# Patient Record
Sex: Male | Born: 1945 | Race: White | Hispanic: No | Smoking: Former smoker
Health system: Southern US, Community
[De-identification: ages and names within clinical notes are randomized; demographics above are authoritative.]

## PROBLEM LIST (undated history)

## (undated) DIAGNOSIS — R1310 Dysphagia, unspecified: Secondary | ICD-10-CM

## (undated) DIAGNOSIS — J449 Chronic obstructive pulmonary disease, unspecified: Secondary | ICD-10-CM

## (undated) DIAGNOSIS — E785 Hyperlipidemia, unspecified: Secondary | ICD-10-CM

## (undated) DIAGNOSIS — I779 Disorder of arteries and arterioles, unspecified: Secondary | ICD-10-CM

## (undated) DIAGNOSIS — I10 Essential (primary) hypertension: Secondary | ICD-10-CM

## (undated) DIAGNOSIS — Z923 Personal history of irradiation: Secondary | ICD-10-CM

## (undated) DIAGNOSIS — C099 Malignant neoplasm of tonsil, unspecified: Secondary | ICD-10-CM

## (undated) DIAGNOSIS — F410 Panic disorder [episodic paroxysmal anxiety] without agoraphobia: Secondary | ICD-10-CM

## (undated) DIAGNOSIS — I2510 Atherosclerotic heart disease of native coronary artery without angina pectoris: Secondary | ICD-10-CM

## (undated) DIAGNOSIS — K219 Gastro-esophageal reflux disease without esophagitis: Secondary | ICD-10-CM

## (undated) DIAGNOSIS — M1990 Unspecified osteoarthritis, unspecified site: Secondary | ICD-10-CM

## (undated) DIAGNOSIS — I739 Peripheral vascular disease, unspecified: Secondary | ICD-10-CM

## (undated) DIAGNOSIS — Z789 Other specified health status: Secondary | ICD-10-CM

## (undated) DIAGNOSIS — Z87442 Personal history of urinary calculi: Secondary | ICD-10-CM

## (undated) DIAGNOSIS — Z8249 Family history of ischemic heart disease and other diseases of the circulatory system: Secondary | ICD-10-CM

## (undated) DIAGNOSIS — N200 Calculus of kidney: Secondary | ICD-10-CM

## (undated) HISTORY — DX: Dysphagia, unspecified: R13.10

## (undated) HISTORY — DX: Calculus of kidney: N20.0

## (undated) HISTORY — DX: Family history of ischemic heart disease and other diseases of the circulatory system: Z82.49

## (undated) HISTORY — DX: Other specified health status: Z78.9

## (undated) HISTORY — PX: EYE SURGERY: SHX253

## (undated) HISTORY — DX: Essential (primary) hypertension: I10

## (undated) HISTORY — PX: MULTIPLE TOOTH EXTRACTIONS: SHX2053

## (undated) HISTORY — DX: Panic disorder (episodic paroxysmal anxiety): F41.0

## (undated) HISTORY — PX: BICEPS TENDON REPAIR: SHX566

## (undated) HISTORY — DX: Atherosclerotic heart disease of native coronary artery without angina pectoris: I25.10

## (undated) HISTORY — DX: Hyperlipidemia, unspecified: E78.5

---

## 2000-08-25 ENCOUNTER — Encounter: Admission: RE | Admit: 2000-08-25 | Discharge: 2000-08-25 | Payer: Self-pay | Admitting: Family Medicine

## 2000-08-25 ENCOUNTER — Encounter: Payer: Self-pay | Admitting: Family Medicine

## 2000-08-30 ENCOUNTER — Ambulatory Visit (HOSPITAL_COMMUNITY): Admission: RE | Admit: 2000-08-30 | Discharge: 2000-08-30 | Payer: Self-pay | Admitting: Family Medicine

## 2001-12-02 ENCOUNTER — Encounter: Payer: Self-pay | Admitting: Urology

## 2001-12-02 ENCOUNTER — Ambulatory Visit (HOSPITAL_BASED_OUTPATIENT_CLINIC_OR_DEPARTMENT_OTHER): Admission: RE | Admit: 2001-12-02 | Discharge: 2001-12-02 | Payer: Self-pay | Admitting: Urology

## 2001-12-02 ENCOUNTER — Ambulatory Visit (HOSPITAL_COMMUNITY): Admission: RE | Admit: 2001-12-02 | Discharge: 2001-12-02 | Payer: Self-pay | Admitting: Urology

## 2003-07-06 ENCOUNTER — Ambulatory Visit (HOSPITAL_COMMUNITY): Admission: RE | Admit: 2003-07-06 | Discharge: 2003-07-07 | Payer: Self-pay | Admitting: Orthopaedic Surgery

## 2003-07-18 ENCOUNTER — Emergency Department (HOSPITAL_COMMUNITY): Admission: EM | Admit: 2003-07-18 | Discharge: 2003-07-18 | Payer: Self-pay | Admitting: Emergency Medicine

## 2003-11-09 ENCOUNTER — Ambulatory Visit (HOSPITAL_COMMUNITY): Admission: RE | Admit: 2003-11-09 | Discharge: 2003-11-09 | Payer: Self-pay | Admitting: Orthopaedic Surgery

## 2004-12-07 ENCOUNTER — Encounter: Admission: RE | Admit: 2004-12-07 | Discharge: 2004-12-07 | Payer: Self-pay | Admitting: Family Medicine

## 2005-01-16 ENCOUNTER — Encounter: Admission: RE | Admit: 2005-01-16 | Discharge: 2005-02-13 | Payer: Self-pay | Admitting: Neurological Surgery

## 2005-01-20 ENCOUNTER — Encounter: Admission: RE | Admit: 2005-01-20 | Discharge: 2005-01-20 | Payer: Self-pay | Admitting: Neurological Surgery

## 2006-05-14 ENCOUNTER — Ambulatory Visit: Payer: Self-pay | Admitting: Family Medicine

## 2006-05-18 DIAGNOSIS — I2510 Atherosclerotic heart disease of native coronary artery without angina pectoris: Secondary | ICD-10-CM

## 2006-05-18 HISTORY — DX: Atherosclerotic heart disease of native coronary artery without angina pectoris: I25.10

## 2007-02-25 ENCOUNTER — Ambulatory Visit: Payer: Self-pay | Admitting: Family Medicine

## 2007-03-24 ENCOUNTER — Ambulatory Visit: Payer: Self-pay | Admitting: Family Medicine

## 2007-07-05 ENCOUNTER — Ambulatory Visit: Payer: Self-pay | Admitting: Family Medicine

## 2007-07-07 ENCOUNTER — Ambulatory Visit: Payer: Self-pay | Admitting: Family Medicine

## 2007-12-30 ENCOUNTER — Ambulatory Visit: Payer: Self-pay | Admitting: Family Medicine

## 2008-02-21 ENCOUNTER — Ambulatory Visit: Payer: Self-pay | Admitting: Family Medicine

## 2008-03-01 ENCOUNTER — Encounter: Admission: RE | Admit: 2008-03-01 | Discharge: 2008-03-01 | Payer: Self-pay | Admitting: Family Medicine

## 2008-03-01 ENCOUNTER — Ambulatory Visit: Payer: Self-pay | Admitting: Family Medicine

## 2008-04-02 ENCOUNTER — Ambulatory Visit: Payer: Self-pay | Admitting: Family Medicine

## 2008-05-18 HISTORY — PX: COLONOSCOPY: SHX174

## 2008-06-18 ENCOUNTER — Ambulatory Visit: Payer: Self-pay | Admitting: Family Medicine

## 2008-06-18 ENCOUNTER — Observation Stay (HOSPITAL_COMMUNITY): Admission: EM | Admit: 2008-06-18 | Discharge: 2008-06-19 | Payer: Self-pay | Admitting: Emergency Medicine

## 2008-06-19 HISTORY — PX: CARDIAC CATHETERIZATION: SHX172

## 2008-06-20 ENCOUNTER — Ambulatory Visit: Payer: Self-pay | Admitting: Family Medicine

## 2008-07-26 ENCOUNTER — Ambulatory Visit: Payer: Self-pay | Admitting: Family Medicine

## 2008-08-20 ENCOUNTER — Ambulatory Visit: Payer: Self-pay | Admitting: Family Medicine

## 2008-11-27 ENCOUNTER — Ambulatory Visit: Payer: Self-pay | Admitting: Family Medicine

## 2008-11-29 ENCOUNTER — Encounter: Admission: RE | Admit: 2008-11-29 | Discharge: 2008-11-29 | Payer: Self-pay | Admitting: Family Medicine

## 2008-11-29 ENCOUNTER — Encounter (INDEPENDENT_AMBULATORY_CARE_PROVIDER_SITE_OTHER): Payer: Self-pay | Admitting: *Deleted

## 2008-12-07 ENCOUNTER — Ambulatory Visit: Payer: Self-pay | Admitting: Gastroenterology

## 2008-12-17 ENCOUNTER — Ambulatory Visit: Payer: Self-pay | Admitting: Gastroenterology

## 2009-03-21 ENCOUNTER — Telehealth: Payer: Self-pay | Admitting: Gastroenterology

## 2009-03-21 ENCOUNTER — Ambulatory Visit (HOSPITAL_COMMUNITY): Admission: RE | Admit: 2009-03-21 | Discharge: 2009-03-21 | Payer: Self-pay | Admitting: Family Medicine

## 2009-03-21 ENCOUNTER — Encounter (INDEPENDENT_AMBULATORY_CARE_PROVIDER_SITE_OTHER): Payer: Self-pay | Admitting: *Deleted

## 2009-03-29 ENCOUNTER — Encounter: Payer: Self-pay | Admitting: Gastroenterology

## 2009-03-29 ENCOUNTER — Ambulatory Visit: Payer: Self-pay | Admitting: Gastroenterology

## 2009-03-29 DIAGNOSIS — K219 Gastro-esophageal reflux disease without esophagitis: Secondary | ICD-10-CM | POA: Insufficient documentation

## 2009-03-29 LAB — CONVERTED CEMR LAB
Albumin: 3.9 g/dL (ref 3.5–5.2)
Basophils Relative: 0.7 % (ref 0.0–3.0)
Chloride: 102 meq/L (ref 96–112)
Eosinophils Absolute: 0 10*3/uL (ref 0.0–0.7)
Glucose, Bld: 104 mg/dL — ABNORMAL HIGH (ref 70–99)
Lymphocytes Relative: 18.8 % (ref 12.0–46.0)
Lymphs Abs: 1.4 10*3/uL (ref 0.7–4.0)
MCHC: 35.3 g/dL (ref 30.0–36.0)
MCV: 96.1 fL (ref 78.0–100.0)
Monocytes Absolute: 0.6 10*3/uL (ref 0.1–1.0)
Monocytes Relative: 7.4 % (ref 3.0–12.0)
Neutro Abs: 5.4 10*3/uL (ref 1.4–7.7)
RBC: 4.61 M/uL (ref 4.22–5.81)
RDW: 11.6 % (ref 11.5–14.6)
Sodium: 141 meq/L (ref 135–145)
Tissue Transglutaminase Ab, IgA: 1 units (ref <7)
Total Protein: 7.8 g/dL (ref 6.0–8.3)
WBC: 7.5 10*3/uL (ref 4.5–10.5)

## 2009-04-03 ENCOUNTER — Ambulatory Visit: Payer: Self-pay | Admitting: Gastroenterology

## 2009-04-08 ENCOUNTER — Ambulatory Visit: Payer: Self-pay | Admitting: Family Medicine

## 2009-04-09 ENCOUNTER — Telehealth (INDEPENDENT_AMBULATORY_CARE_PROVIDER_SITE_OTHER): Payer: Self-pay | Admitting: *Deleted

## 2009-04-10 ENCOUNTER — Encounter: Admission: RE | Admit: 2009-04-10 | Discharge: 2009-05-08 | Payer: Self-pay | Admitting: Family Medicine

## 2009-04-22 ENCOUNTER — Ambulatory Visit (HOSPITAL_COMMUNITY): Admission: RE | Admit: 2009-04-22 | Discharge: 2009-04-22 | Payer: Self-pay | Admitting: Gastroenterology

## 2009-05-28 ENCOUNTER — Ambulatory Visit: Payer: Self-pay | Admitting: Gastroenterology

## 2009-06-17 ENCOUNTER — Ambulatory Visit: Payer: Self-pay | Admitting: Family Medicine

## 2009-07-01 ENCOUNTER — Ambulatory Visit: Payer: Self-pay | Admitting: Family Medicine

## 2009-08-05 ENCOUNTER — Ambulatory Visit: Payer: Self-pay | Admitting: Family Medicine

## 2009-08-09 ENCOUNTER — Encounter: Admission: RE | Admit: 2009-08-09 | Discharge: 2009-08-09 | Payer: Self-pay | Admitting: Family Medicine

## 2009-10-11 ENCOUNTER — Encounter: Admission: RE | Admit: 2009-10-11 | Discharge: 2009-10-11 | Payer: Self-pay | Admitting: Neurological Surgery

## 2010-02-10 ENCOUNTER — Ambulatory Visit: Payer: Self-pay | Admitting: Family Medicine

## 2010-05-30 ENCOUNTER — Ambulatory Visit
Admission: RE | Admit: 2010-05-30 | Discharge: 2010-05-30 | Payer: Self-pay | Source: Home / Self Care | Attending: Family Medicine | Admitting: Family Medicine

## 2010-06-17 NOTE — Assessment & Plan Note (Signed)
  Review of gastrointestinal problems: 1. Functional dyspepsia.  Belching, bloating, chest discomfort around stressful situations, 2010.  November, 2010 EGD found mild gastritis, biopsies negative for H. pylori. Proton pump inhibitor trial did not help his symptoms. Gastric emptying scan 2010 was normal. Abdominal ultrasound and HIDA scan, per patient report were also normal in 2010. Complete metabolic profile, celiac sprue testing 2010 were normal.    History of Present Illness Visit Type: Follow-up Visit Primary GI MD: Rob Bunting MD Primary Provider: Sharlot Gowda MD Chief Complaint: 4-5 week f.u History of Present Illness:     who has been feeling "pretty good."  He stopped a BP med amlodipine, felt improved soon after stopping.  Ran out of nexium (didn't make a difference anyway).  He has noticed that stressful situation will cause the symptoms (belching, chest discomfort, belching relieved the symptoms immediciately).  He believes he is greiving over his wife who passed november 2009.            Allergies: 1)  ! Sulfa  Vital Signs:  Patient profile:   65 year old male Height:      68 inches Weight:      141.25 pounds BMI:     21.55 Pulse rate:   70 / minute Pulse rhythm:   regular BP sitting:   132 / 70  (left arm)  Vitals Entered By: Chales Abrahams CMA Duncan Dull) (May 28, 2009 10:10 AM)  Physical Exam  Additional Exam:  Constitutional: generally well appearing Psychiatric: alert and oriented times 3 Abdomen: soft, non-tender, non-distended, normal bowel sounds    Impression & Recommendations:  Problem # 1:  functional dyspepsia he has come to the conclusion independently that stressful situations are probably accounting for his dyspeptic symptoms. Most of his stressors revolve around his wife's passing, November 2009 which she is obviously still grieving about. He is able to breathe deeply, sometimes takes a Xanax, relaxes, and this has definitely helped his  symptoms.  He will return to see me on an as-needed basis. The workup, summarized above, quite extensive and I don't think any further testing needs to be done unless things have a significant change.  Patient Instructions: 1)  Return to see Dr. Christella Hartigan as needed. 2)  A copy of this information will be sent to Dr. Susann Givens. 3)  The medication list was reviewed and reconciled.  All changed / newly prescribed medications were explained.  A complete medication list was provided to the patient / caregiver.

## 2010-07-30 ENCOUNTER — Ambulatory Visit (INDEPENDENT_AMBULATORY_CARE_PROVIDER_SITE_OTHER): Payer: BC Managed Care – PPO | Admitting: Family Medicine

## 2010-07-30 DIAGNOSIS — Z79899 Other long term (current) drug therapy: Secondary | ICD-10-CM

## 2010-09-02 LAB — COMPREHENSIVE METABOLIC PANEL
ALT: 20 U/L (ref 0–53)
AST: 27 U/L (ref 0–37)
Albumin: 4 g/dL (ref 3.5–5.2)
Alkaline Phosphatase: 62 U/L (ref 39–117)
Alkaline Phosphatase: 68 U/L (ref 39–117)
BUN: 7 mg/dL (ref 6–23)
BUN: 8 mg/dL (ref 6–23)
CO2: 25 mEq/L (ref 19–32)
CO2: 25 mEq/L (ref 19–32)
Calcium: 9.6 mg/dL (ref 8.4–10.5)
Chloride: 106 mEq/L (ref 96–112)
GFR calc Af Amer: >60 mL/min (ref >60)
GFR calc non Af Amer: >60 mL/min (ref >60)
Glucose, Bld: 96 mg/dL (ref 70–99)
Potassium: 3.5 mEq/L (ref 3.5–5.1)
Potassium: 3.5 mEq/L (ref 3.5–5.1)
Sodium: 141 mEq/L (ref 135–145)
Total Bilirubin: 0.9 mg/dL (ref 0.3–1.2)
Total Bilirubin: 1.1 mg/dL (ref 0.3–1.2)
Total Protein: 6.8 g/dL (ref 6.0–8.3)
Total Protein: 7.5 g/dL (ref 6.0–8.3)

## 2010-09-02 LAB — URINALYSIS, ROUTINE W REFLEX MICROSCOPIC
Bilirubin Urine: NEGATIVE
Nitrite: NEGATIVE
Specific Gravity, Urine: 1.01 (ref 1.005–1.030)
Urobilinogen, UA: 0.2 mg/dL (ref 0.0–1.0)
pH: 8 (ref 5.0–8.0)

## 2010-09-02 LAB — CARDIAC PANEL(CRET KIN+CKTOT+MB+TROPI)
CK, MB: 0.9 ng/mL (ref 0.3–4.0)
Relative Index: 0.8 (ref 0.0–2.5)
Total CK: 119 U/L (ref 7–232)
Total CK: 135 U/L (ref 7–232)
Troponin I: <0.01 ng/mL (ref 0.00–0.06)

## 2010-09-02 LAB — CBC
HCT: 40.7 % (ref 39.0–52.0)
HCT: 41.5 % (ref 39.0–52.0)
HCT: 45 % (ref 39.0–52.0)
Hemoglobin: 14 g/dL (ref 13.0–17.0)
Hemoglobin: 14.3 g/dL (ref 13.0–17.0)
Hemoglobin: 15.5 g/dL (ref 13.0–17.0)
MCHC: 34.4 g/dL (ref 30.0–36.0)
MCHC: 34.4 g/dL (ref 30.0–36.0)
MCV: 93.8 fL (ref 78.0–100.0)
Platelets: 281 10*3/uL (ref 150–400)
RBC: 4.4 MIL/uL (ref 4.22–5.81)
RBC: 4.8 MIL/uL (ref 4.22–5.81)
RDW: 12.5 % (ref 11.5–15.5)
RDW: 12.5 % (ref 11.5–15.5)
RDW: 12.7 % (ref 11.5–15.5)
WBC: 11 10*3/uL — ABNORMAL HIGH (ref 4.0–10.5)

## 2010-09-02 LAB — DIFFERENTIAL
Basophils Absolute: 0 10*3/uL (ref 0.0–0.1)
Basophils Relative: 0 % (ref 0–1)
Eosinophils Absolute: 0 10*3/uL (ref 0.0–0.7)
Eosinophils Absolute: 0 10*3/uL (ref 0.0–0.7)
Lymphs Abs: 1.4 10*3/uL (ref 0.7–4.0)
Monocytes Absolute: 0.5 10*3/uL (ref 0.1–1.0)
Monocytes Relative: 5 % (ref 3–12)
Monocytes Relative: 6 % (ref 3–12)
Neutro Abs: 6.6 10*3/uL (ref 1.7–7.7)
Neutrophils Relative %: 77 % (ref 43–77)
Neutrophils Relative %: 81 % — ABNORMAL HIGH (ref 43–77)

## 2010-09-02 LAB — RAPID URINE DRUG SCREEN, HOSP PERFORMED
Barbiturates: NOT DETECTED
Cocaine: NOT DETECTED
Opiates: NOT DETECTED

## 2010-09-02 LAB — APTT: aPTT: 29 seconds (ref 24–37)

## 2010-09-02 LAB — BASIC METABOLIC PANEL
BUN: 7 mg/dL (ref 6–23)
CO2: 28 mEq/L (ref 19–32)
Glucose, Bld: 102 mg/dL — ABNORMAL HIGH (ref 70–99)
Potassium: 4.6 mEq/L (ref 3.5–5.1)
Sodium: 140 mEq/L (ref 135–145)

## 2010-09-02 LAB — LIPID PANEL: VLDL: UNDETERMINED mg/dL (ref 0–40)

## 2010-09-02 LAB — MAGNESIUM: Magnesium: 1.9 mg/dL (ref 1.5–2.5)

## 2010-09-02 LAB — BRAIN NATRIURETIC PEPTIDE: Pro B Natriuretic peptide (BNP): <30 pg/mL (ref 0.0–100.0)

## 2010-09-02 LAB — POCT CARDIAC MARKERS
CKMB, poc: 1.2 ng/mL (ref 1.0–8.0)
Troponin i, poc: <0.05 ng/mL (ref 0.00–0.09)

## 2010-09-02 LAB — AMYLASE: Amylase: 87 U/L (ref 27–131)

## 2010-09-02 LAB — PROTIME-INR: Prothrombin Time: 12.9 seconds (ref 11.6–15.2)

## 2010-09-30 NOTE — Discharge Summary (Signed)
NAME:  Matthew Moreno, POKORSKI NO.:  [id number]   MEDICAL RECORD NO.:  [id number]          PATIENT TYPE:  INP   LOCATION:  4740                         FACILITY:  [facility]   PHYSICIAN:  Thereasa Solo. Little, M.D. DATE OF BIRTH:  23-Aug-1945   DATE OF ADMISSION:  06/18/2008  DATE OF DISCHARGE:  06/19/2008                               DISCHARGE SUMMARY   DISCHARGE DIAGNOSES:  1. Chest pain, noncoronary ischemia related.  2. Coronary artery disease status post cardiac catheterization on      June 19, 2008, by Dr. Julieanne Manson with disease in his left      anterior descending 40-50% mid, right coronary artery was tortuous      and 30% mid, right first obtuse marginal artery 1 was 50% ostial.  3. Ejection fraction of greater than 60%.  4. No renal artery stenosis and no abdominal aneurysm noted on      catheterization.  5. Hypertension, new, now placed on lisinopril and metoprolol.  6. Dyslipidemia with low HDL, high non-HDL, and high triglycerides,      now on simvastatin 40 to be followed as an outpatient.  7. Anxiety.  8. Significant premature family history of coronary artery disease      with a mother who died at age 41 with an myocardial infarction.   LABORATORY DATA:  AST was 27, ALT was 24.  Sodium 139, potassium 3.5,  glucose was 104, BUN 8, creatinine 1.01, magnesium was 1.9.  Urine drug  screen was negative.  BNP was less than 30.  TSH was 0.888.  Hemoglobin  14.3, hematocrit 41.5, WBCs 8.5, and platelets 281.  Urinalysis was  negative.  Total cholesterol was 215, triglycerides 408, HDL was 29, and  LDL was nondetectable.  His non-HDL is 186.  Chest x-ray showed no acute  findings.   DISCHARGE MEDICATIONS:  1. Lisinopril 10 mg a day.  2. Metoprolol 25 mg twice per day.  3. Simvastatin 40 mg a day.  4. Aspirin 81 mg 3 per day.  5. Xanax 0.25 mg as needed p.r.n.   HOSPITAL COURSE:  [name] is a 65 year old white widowed male  patient who was out  shoveling snow on June 18, 2008.  He had a sudden  onset of shortness of breath and chest heaviness, he tried to ignore it,  he drove to the post office, he became diaphoretic there, he went home,  took some Xanax and rested for 30 minutes, he had a short nap, but when  he awoke, his discomfort was still present, so he went to see Dr.  Susann Givens.  He was given aspirin and a nitroglycerin without much relief.  O2 was started.  His EKG was without any acute changes and he was sent  to the emergency room.  Dr. Susann Givens referred him to our service.  In the  ER, he was seen by Dr. Nicki Guadalajara who did not think this was acute.  He wanted to watch him overnight.  His CK-MBs and troponins were  negative.  He was seen by Dr. Julieanne Manson the following morning.  He  had had similar episodes of chest pain at home in November 2009.  Dr.  Clarene Duke decided with his premature family history and his lipid profile  that he should undergo cardiac catheterization.  This was performed and  he had some disease, but nonobstructive.  His LAD had mid 40-50%, his  circumflex had 3 OM, his OM1 had ostial 50%, his RCA was tortuous with  30% mid-narrowing, his left main was patent.  He did not have any renal  artery stenosis and no abdominal aorta was noted.  It was decided he  could be discharged to home on hypertensive medications and lipid  medications.  He will follow up with  Dr. Sheliah Mends on July 04, 2008, at 9:30.  He was told not to do  any strenuous activity, lifting, pushing, pulling, or extended walking  for a week.  If he has any problems with his groin, he can give our  office a call.  He should do no lifting for a week and no driving for 2  weeks.      Lezlie Octave, N.P.    ______________________________  Thereasa Solo. Little, M.D.    BB/MEDQ  D:  06/19/2008  T:  06/20/2008  Job:  [id number]   cc:   Sharlot Gowda, M.D.

## 2010-09-30 NOTE — H&P (Signed)
NAME:  CHINEDUM, VANHOUTEN NO.:  [id number]   MEDICAL RECORD NO.:  [id number]          PATIENT TYPE:  INP   LOCATION:  4740                         FACILITY:  [facility]   PHYSICIAN:  Nicki Guadalajara, M.D.     DATE OF BIRTH:  [birth year]   DATE OF ADMISSION:  06/18/2008  DATE OF DISCHARGE:                              HISTORY & PHYSICAL   CHIEF COMPLAINT:  Chest pain, shortness of breath.   HISTORY OF PRESENT ILLNESS:  A 65 year old white widowed male with no  prior cardiac history, was shoveling snow today, not a significant  amount of shoveling, developed sudden onset of shortness of breath and  chest heaviness.  He tried to ignore the discomfort and drove to the  post office to mail a letter, became diaphoretic as well, went home,  took Xanax, rested for about 30 minutes, but when woke up, he also  continued with the discomfort and shortness of breath.  Went to see Dr.  Susann Givens in the office, the patient's color was not good.  He was given  aspirin, nitroglycerin sublingual without much relief.  Oxygen was  started and EKG without acute changes, but no old one to compare it, was  sent by EMS to the emergency room here at [facility], still some chest  heaviness and shortness of breath.  He denied any leg pain.  No recent  trips and no prior chest discomfort with exertion.   PAST MEDICAL HISTORY:  He states his blood pressure has been slightly  elevated lately, 148 systolic, denies diabetes, history of  nephrolithiasis, sees Dr. Wanda Plump.  He has had a left arm biceps  tendon relocation at one point, history of anxiety.   ALLERGIES:  SULFA.   OUTPATIENT MEDICATIONS:  Xanax 0.5 to 1 mg p.o. p.r.n.  The patient  lives in [place].  He lives alone.  He is widowed.  His wife died in  2009-12-01with lung cancer.  He is retired.  He used to install  sprinklers and building.  He has done once here at [facility], retired in  October 2009, to help with his wife.  He smoked  remotely, but is over 10  years ago.  He does drink two shots of bourbon at bedtime.  He is  active, but no particular exercise.   FAMILY HISTORY:  Mother died with an MI at 35, father unknown.   MEDICAL HISTORY:  One brother died in a motor vehicle accident.  Two  brothers alive and well.  One son died with Tylenol overdose.  He does  have a daughter who is healthy and one grandchild.   REVIEW OF SYSTEMS:  No colds, fevers, weight changes recently.  HEENT:  Wears glasses, but no other complaints.  SKIN:  Without rashes or  lesions.  CARDIOPULMONARY:  Chest pain, shortness of breath, dyspnea on  exertion, and he was dizzy when he went from the bed for the exam table  to the stretcher at Dr. Jola Babinski, unsure if this was after the nitro.  GU: No hematuria or dysuria.  Does have a history of anxiety and  anxiety  attacks.  He is on Xanax, has tried long-acting medication in the past,  but may feel bad.  MUSCULOSKELETAL:  Occasionally had tingling in his  hands and feet now.  GI:  No nausea, vomiting, diarrhea, no history of  melena.  No history of ulcers.  ENDOCRINE:  No diabetes or thyroid  disease.   PHYSICAL EXAMINATION:  VITAL SIGNS:  Temperature 98.3 on arrival, pulse  93, respirations 24, blood pressure 130/74, oxygen saturation on room  air 100%.  GENERAL:  Alert, oriented white male.  He has short of breath with  talking, but pleasant affect and he is anxious.  HEENT: Pupils equal, round, and react to light.  Sclera clear.  Normocephalic.  Glasses are in place.  NECK:  Supple.  No bruits.  No JVD, no adenopathy.  HEART: Regular rate and rhythm.  S1 and S2, did not hear a murmur,  gallop, rub, or click.  LUNGS:  Few crackles, rhonchi in the bases.  No wheezes are noted.  SKIN:  Without rashes or lesions.  ABDOMEN:  Soft, positive bowel sounds, does have mild tenderness in the  epigastric region.  He states he has had that for like 2 months and the  discomfort today is  different.  Also, please note no chest wall pain to  palpation.  EXTREMITIES:  No edema, no rashes, no lesions, no petechiae.  MUSCULOSKELETAL:  No joint deformities, effusions, no back pain.  NEUROLOGIC:  Alert and oriented x3.  Cranial nerves II through VII are  grossly intact.  Strengths are equal.  Chest x-ray portable was done.  Lungs are clear, no acute findings.   EKG initial from Dr. Susann Givens is sinus rhythm, possible LVH, and  possible, there is some wavy length, difficult to assess if this was J-  point elevation, but followup EKG here in the ER sinus rhythm without  any acute changes at all.   Laboratory Data:  Pending at the time of dictation.   IMPRESSION:  1. Shortness of breath, sudden onset  2. Chest heaviness rule out myocardial infarction.  Also rule out      pulmonary edema with D-dimer.  3. History of anxiety disorder.   PLAN:  We will admit to tele bed unless the pain becomes more  significant, IV nitroglycerin, will do Lovenox injections night and then  hold by morning depending to see if he needs cardiac cath versus new  study.  EKG in the morning will do serial CK MBs and IV nitroglycerin.  The patient was seen and examined by Dr. Tresa Endo.  His D-dimer is  elevated.  He will need a CT angio.  Dr. Tresa Endo talked to the patient, as  well as the patient's daughter.      Darcella Gasman. Annie Paras, N.P.    ______________________________  Nicki Guadalajara, M.D.    LRI/MEDQ  D:  06/18/2008  T:  06/19/2008  Job:  [id number]   cc:   Nicki Guadalajara, M.D.  Sharlot Gowda, M.D.

## 2010-09-30 NOTE — Cardiovascular Report (Signed)
NAME:  Matthew Moreno, Matthew Moreno NO.:  [id number]   MEDICAL RECORD NO.:  [id number]          PATIENT TYPE:  INP   LOCATION:  4740                         FACILITY:  [facility]   PHYSICIAN:  Thereasa Solo. Little, M.D. DATE OF BIRTH:  1945/08/24   DATE OF PROCEDURE:  06/19/2008  DATE OF DISCHARGE:  06/19/2008                            CARDIAC CATHETERIZATION   INDICATIONS FOR TEST:  This 65 year old male had an episode of chest  discomfort associated with diaphoresis in November.  It lasted only a  short period of time and he did not seek medical attention.  Yesterday  while shoveling snow, he again had a chest discomfort with some  diaphoresis and mild nausea, took a nap and some Xanax.  It got better  but went to see Dr. Susann Givens.  His cardiac markers and EKG are both  unremarkable.   PROCEDURE:  The patient was prepped and draped in the usual sterile  fashion exposing the right groin.  Following local anesthetic with 1%  Xylocaine, the Seldinger technique was employed and a 5-French  introducer sheath was placed into the right femoral artery.  Left and  right coronary arteriography and ventriculography in the RAO projection  and a distal aortogram was performed.   COMPLICATIONS:  None.   MEDICATIONS:  None given during the procedure.   EQUIPMENT:  5-French Judkins configuration catheters.   TOTAL CONTRAST USED:  105 mL.   RESULTS:  1. Hemodynamic monitoring.  His central aortic pressure was 117/67.      His left ventricular pressure was 122/3 with no aortic valve      gradient at the time of pullback.  2. Ventriculography.  Ventriculography in the RAO projection performed      at the end of the procedure using 20 mL of contrast at 12 mL per      second showed normal wall motion and normal systolic function.      Ejection fraction excess of 60%, no mitral regurgitation.  End-      diastolic pressure 8.  3. Distal aortogram.  The distal aortogram done above the level of   renal arteries using 25 mL of contrast at 15 mL per second showed      no evidence of renal artery stenosis, minimal irregularities in the      infrarenal segments.   CORONARY ARTERIOGRAPHY:  On fluoroscopy, there was moderate-to-severe  calcification in the proximal and mid LAD.  1. The left main normal and bifurcated.  2. Circumflex.  The circumflex gave rise to three OM vessels.  There      was 50% ostial narrowing of the OM #1 (this will need to be managed      medically.) The remainder of the circumflex system was free of      disease.  3. LAD.  The LAD crossed the apex of the heart.  It was densely      calcified to at least the proximal half of the vessel.  There was      mild eccentric narrowing in the midportion of the LAD of about 40-  50% severity.  The distal vessel was free of disease.  The first      and second diagonals were also free of disease.   Right coronary artery.  This was a tortuous vessel with less than 30%  narrowing in its midportion.  The distal right coronary artery, the  posterior descending artery and the posterolateral vessel were all free  of disease.   Mr. Weisgerber will require medical management and will probably need a  stress test in about a year.           ______________________________  Thereasa Solo. Little, M.D.     ABL/MEDQ  D:  06/19/2008  T:  06/20/2008  Job:  [id number]   cc:   Sharlot Gowda, M.D.  Cath Lab  Nicki Guadalajara, M.D.

## 2010-10-03 NOTE — Op Note (Signed)
[facility]  Patient:    Matthew Moreno, Matthew Moreno Visit Number: [id number] MRN: [id number]          Service Type: DSU Location: DAY Attending Physician:  Ellwood Handler Dictated by:   Verl Dicker, M.D. Proc. Date: 12/02/01 Admit Date:  12/02/2001 Discharge Date: 12/02/2001                             Operative Report  DATE OF BIRTH:  11-17-1945  LMD:  Ronnald Nian, M.D.  UROLOGIST:  Verl Dicker, M.D.  PREOPERATIVE DIAGNOSES:  5 mm and 4 mm stones at the left UVJ. The stones have been present since October 27, 2001 and have not moved despite prolonged conservative therapy. Plan made to proceed with ureteroscopy and stone extraction.  POSTOPERATIVE DIAGNOSES:  Not given.  ANESTHESIA:  General LMA.  DRAINS:  None.  COMPLICATIONS:  None.  DESCRIPTION OF PROCEDURE:  The patient was prepped and draped in the dorsal lithotomy position after institution of an adequate level of general anesthesia (LMA). A well lubricated 21 French panendoscope was gently inserted at the urethral meatus, normal urethra and sphincter, nonobstructive prostate. The bladder showed no evidence of tumor, bleeding site or other anatomic abnormality. Right retrograde showed normal course and caliber of the ureter, pelvis and calices with prompt drainage at 3-5 minutes. The left ureter showed stone protruding from the left ureteral orifice which was grasped with biopsy forceps and then gently withdrawn. Retrograde was performed and showed an additional filling defect in the distal ureter. A guidewire was negotiated beyond the filling defect. A 6 French end-hole catheter was passed over the guidewire and left in place for five minutes. The end-hole catheter was withdrawn, guidewire was left in place, ureteroscope was inserted, stony fragments were identified within the left distal ureter and were removed using the nitinol basket. The ureteroscope  was then reinserted, no evidence of damage to the ureter. There were no remaining stony fragments. The ureter appeared well dilated, no double J stent was left in place. The bladder was drained, cystoscope was removed. The patient was given a B&O suppository and returned to recovery in good condition. Dictated by:   Verl Dicker, M.D. Attending Physician:  Ellwood Handler DD:  12/02/01 TD:  12/07/01 Job: [id number]

## 2010-10-03 NOTE — Op Note (Signed)
NAME:  Matthew Moreno, Matthew Moreno NO.:  [id number]   MEDICAL RECORD NO.:  [id number]                   PATIENT TYPE:  OIB   LOCATION:  2550                                 FACILITY:  [facility]   PHYSICIAN:  Mark C. Ophelia Charter, M.D.                 DATE OF BIRTH:  02-16-1946   DATE OF PROCEDURE:  07/06/2003  DATE OF DISCHARGE:                                 OPERATIVE REPORT   PREOPERATIVE DIAGNOSIS:  Left distal biceps tendon rupture at elbow.   POSTOPERATIVE DIAGNOSIS:  Left distal biceps tendon rupture at elbow.   PROCEDURE:  Exploration and repair left distal biceps tendon to bicipital  tuberosity of radius.   SURGEON:  Mark C. Ophelia Charter, M.D.   ASSISTANT:  Sandrea Matte, P.A.-C.   ANESTHESIA:  GET.   ESTIMATED BLOOD LOSS:  Minimal.   TOURNIQUET TIME:  34 minutes x 250.   PROCEDURE:  After induction of general anesthesia and orotracheal  intubation, prepping from the fingertips up to the shoulder, two green  towels were applied at the shoulder, towel clips, stockinette, and extremity  sheet.  A sterile tourniquet was applied over the Webril.  The arm was  wrapped with Esmarch and tourniquet inflated to 250 mmHg.  An S-shaped  incision was made over the antecubital fossa and proximally, the  subcutaneous tissue did not show any hemorrhage.  There was one large  antecubital vein which was preserved.  The fascia was split open in the  distal forearm and immediately a rolled up tendon was identified which was  the biceps tendon.  Dissection continued distally and was followed down, a  fingertip was introduced where the tip of the finger was able to touch the  bicipital tuberosity with the forearm in supination.  This was freshened up  with a rongeur.  Two anchors were placed.  Orientation of the tendon stump  was identified and appropriately placed and sutures were placed.  The biceps  tendon was sutured down to the freshened up bicipital tuberosity that had  been  rongeured.  The elbow was allowed to come all the way into full  extension.  Repair was tight.  The tendon was not bunched up and was  directed down against the bone.  After irrigation with saline solution, the  tourniquet was deflated.  Hemostasis was already achieved.  There was no  bleeding.  The median nerve was identified.  After irrigation with saline  solution, the subcutaneous tissues were closed with 3-0 Vicryl, skin staple  closure.  Xeroform, 4 by 4s, Webril, and posterior splint with elbow at 90  was applied.  Instrument count and needle counts were correct.  Mark C. Ophelia Charter, M.D.    MCY/MEDQ  D:  07/06/2003  T:  07/07/2003  Job:  [id number]

## 2010-11-12 ENCOUNTER — Telehealth: Payer: Self-pay | Admitting: Family Medicine

## 2010-11-12 MED ORDER — ALPRAZOLAM 0.5 MG PO TABS: 0.5000 mg | ORAL_TABLET | Freq: Every day | ORAL | Status: AC | PRN

## 2010-11-12 NOTE — Telephone Encounter (Signed)
Needs refill on xanax rx. He has 2 refills left but they expired in May

## 2010-11-12 NOTE — Telephone Encounter (Signed)
Per pharmacy, this was last filled in 03/2010.  Refill called into pharmacy

## 2011-01-29 ENCOUNTER — Other Ambulatory Visit (INDEPENDENT_AMBULATORY_CARE_PROVIDER_SITE_OTHER): Payer: BC Managed Care – PPO

## 2011-01-29 DIAGNOSIS — Z23 Encounter for immunization: Secondary | ICD-10-CM

## 2011-05-20 ENCOUNTER — Telehealth: Payer: Self-pay | Admitting: Internal Medicine

## 2011-05-20 MED ORDER — ALPRAZOLAM 0.25 MG PO TABS: 0.2500 mg | ORAL_TABLET | Freq: Two times a day (BID) | ORAL | Status: DC | PRN

## 2011-05-20 MED ORDER — ALPRAZOLAM 0.5 MG PO TABS: 0.5000 mg | ORAL_TABLET | Freq: Every evening | ORAL | Status: DC | PRN

## 2011-05-20 NOTE — Telephone Encounter (Signed)
Med called in

## 2011-05-20 NOTE — Telephone Encounter (Signed)
Review his Xanax with several refills

## 2011-05-20 NOTE — Telephone Encounter (Signed)
Called xanax .5 in # 30 with 4 refills

## 2011-06-12 ENCOUNTER — Encounter: Payer: Self-pay | Admitting: Internal Medicine

## 2011-06-23 ENCOUNTER — Ambulatory Visit (INDEPENDENT_AMBULATORY_CARE_PROVIDER_SITE_OTHER): Payer: Medicare Other | Admitting: Family Medicine

## 2011-06-23 ENCOUNTER — Encounter: Payer: Self-pay | Admitting: Family Medicine

## 2011-06-23 VITALS — BP 128/80 | HR 77 | Ht 64.0 in | Wt 147.0 lb

## 2011-06-23 DIAGNOSIS — F419 Anxiety disorder, unspecified: Secondary | ICD-10-CM

## 2011-06-23 DIAGNOSIS — Z87891 Personal history of nicotine dependence: Secondary | ICD-10-CM

## 2011-06-23 DIAGNOSIS — E781 Pure hyperglyceridemia: Secondary | ICD-10-CM | POA: Insufficient documentation

## 2011-06-23 DIAGNOSIS — Z79899 Other long term (current) drug therapy: Secondary | ICD-10-CM

## 2011-06-23 DIAGNOSIS — I2510 Atherosclerotic heart disease of native coronary artery without angina pectoris: Secondary | ICD-10-CM

## 2011-06-23 DIAGNOSIS — Z0000 Encounter for general adult medical examination without abnormal findings: Secondary | ICD-10-CM

## 2011-06-23 DIAGNOSIS — F411 Generalized anxiety disorder: Secondary | ICD-10-CM

## 2011-06-23 LAB — CBC WITH DIFFERENTIAL/PLATELET
Eosinophils Absolute: 0.1 10*3/uL (ref 0.0–0.7)
Eosinophils Relative: 1 % (ref 0–5)
HCT: 44.8 % (ref 39.0–52.0)
Hemoglobin: 15.3 g/dL (ref 13.0–17.0)
Lymphs Abs: 2 10*3/uL (ref 0.7–4.0)
MCH: 32.8 pg (ref 26.0–34.0)
MCV: 96.1 fL (ref 78.0–100.0)
Monocytes Absolute: 0.6 10*3/uL (ref 0.1–1.0)
Monocytes Relative: 9 % (ref 3–12)
Neutrophils Relative %: 60 % (ref 43–77)
RBC: 4.66 MIL/uL (ref 4.22–5.81)

## 2011-06-23 LAB — COMPREHENSIVE METABOLIC PANEL
CO2: 27 mEq/L (ref 19–32)
Glucose, Bld: 98 mg/dL (ref 70–99)
Sodium: 138 mEq/L (ref 135–145)
Total Bilirubin: 0.5 mg/dL (ref 0.3–1.2)
Total Protein: 7.6 g/dL (ref 6.0–8.3)

## 2011-06-23 LAB — LIPID PANEL
Cholesterol: 245 mg/dL — ABNORMAL HIGH (ref 0–200)
Triglycerides: 308 mg/dL — ABNORMAL HIGH (ref <150)
VLDL: 62 mg/dL — ABNORMAL HIGH (ref 0–40)

## 2011-06-23 NOTE — Progress Notes (Signed)
Subjective:    Patient ID: Matthew Moreno, male    DOB: [birth year], 66 y.o.   MRN: [id number]  HPI  he is here for his initial examination since joining Medicare. He does have a previous history of over the last several years having a lot of abdominal distress and bloating with eructation. He has been seen by Dr. Christella Hartigan for this. Apparently no specific diagnosis was made. He continues on Toprol and is having no difficulty with this. He did have a previous cardiac evaluation. He also continues on chemotherapy and is having no trouble with that. He uses Xanax very sparingly. He keeps himself physically active. He has no other concerns or complaints. He is now retired. His wife died several years ago. He seems to be handling his living situation quite well.   Review of Systems  Constitutional: Negative.   HENT: Negative.   Eyes: Negative.   Respiratory: Negative.   Cardiovascular: Negative.   Gastrointestinal: Positive for abdominal distention.  Genitourinary: Negative.   Musculoskeletal: Negative.   Skin: Negative.   Psychiatric/Behavioral: Negative.        Objective:   Physical Exam BP 128/80  Pulse 77  Ht 5\' 4"  (1.626 m)  Wt 147 lb (66.679 kg)  BMI 25.23 kg/m2  SpO2 95%  General Appearance:    Alert, cooperative, no distress, appears stated age  Head:    Normocephalic, without obvious abnormality, atraumatic  Eyes:    PERRL, conjunctiva/corneas clear, EOM's intact, fundi    benign  Ears:    Normal TM's and external ear canals  Nose:   Nares normal, mucosa normal, no drainage or sinus   tenderness  Throat:   Lips, mucosa, and tongue normal; teeth and gums normal  Neck:   Supple, no lymphadenopathy;  thyroid:  no   enlargement/tenderness/nodules; no carotid   bruit or JVD  Back:    Spine nontender, no curvature, ROM normal, no CVA     tenderness  Lungs:     Clear to auscultation bilaterally without wheezes, rales or     ronchi; respirations unlabored  Chest Wall:    No  tenderness or deformity   Heart:    Regular rate and rhythm, S1 and S2 normal, no murmur, rub   or gallop  Breast Exam:    No chest wall tenderness, masses or gynecomastia  Abdomen:     Soft, non-tender, nondistended, normoactive bowel sounds,    no masses, no hepatosplenomegaly  Genitalia:    Normal male external genitalia without lesions.  Testicles without masses.  No inguinal hernias.  Rectal:    Normal sphincter tone, no masses or tenderness; guaiac negative stool.  Prostate smooth, no nodules, not enlarged.  Extremities:   No clubbing, cyanosis or edema  Pulses:   2+ and symmetric all extremities  Skin:   Skin color, texture, turgor normal, no rashes or lesions  Lymph nodes:   Cervical, supraclavicular, and axillary nodes normal  Neurologic:   CNII-XII intact, normal strength, sensation and gait; reflexes 2+ and symmetric throughout          Psych:   Normal mood, affect, hygiene and grooming.           Assessment & Plan:   1. Routine general medical examination at a health care facility  CBC with Differential, Comprehensive metabolic panel, Lipid panel, US Aorta Initial Medicare Screen, POCT Hemoccult (HOME) Blood/Stoool Cards  2. Hypertriglyceridemia  Lipid panel  3. Anxiety    4. Former smoker  5. Encounter for long-term (current) use of other medications  CBC with Differential, Comprehensive metabolic panel, Lipid panel  6. CAD (coronary artery disease)     encouraged him to continue with his present lifestyle.

## 2011-06-23 NOTE — Patient Instructions (Signed)
Continue to take good care of yourself. We will call you with results of the blood work and ultrasound

## 2011-06-29 ENCOUNTER — Ambulatory Visit (HOSPITAL_COMMUNITY)
Admission: RE | Admit: 2011-06-29 | Discharge: 2011-06-29 | Disposition: A | Discharge status: Home / Self Care | Payer: Medicare Other | Source: Ambulatory Visit | Attending: Family Medicine | Admitting: Family Medicine

## 2011-06-29 DIAGNOSIS — I700 Atherosclerosis of aorta: Secondary | ICD-10-CM | POA: Insufficient documentation

## 2011-06-29 DIAGNOSIS — Z0000 Encounter for general adult medical examination without abnormal findings: Secondary | ICD-10-CM

## 2012-02-15 ENCOUNTER — Other Ambulatory Visit: Payer: Medicare Other

## 2012-02-15 DIAGNOSIS — Z23 Encounter for immunization: Secondary | ICD-10-CM

## 2012-02-15 MED ORDER — INFLUENZA VIRUS VACC SPLIT PF IM SUSP: 0.5000 mL | Freq: Once | INTRAMUSCULAR | Status: AC

## 2012-02-19 ENCOUNTER — Telehealth: Payer: Self-pay | Admitting: Family Medicine

## 2012-02-22 NOTE — Telephone Encounter (Signed)
FYI

## 2012-06-21 ENCOUNTER — Encounter: Payer: Self-pay | Admitting: Medical

## 2012-06-21 ENCOUNTER — Ambulatory Visit (INDEPENDENT_AMBULATORY_CARE_PROVIDER_SITE_OTHER): Payer: Medicare Other | Admitting: Medical

## 2012-06-21 VITALS — BP 130/82 | HR 84 | Temp 98.2°F | Resp 16 | Wt 156.0 lb

## 2012-06-21 DIAGNOSIS — F410 Panic disorder [episodic paroxysmal anxiety] without agoraphobia: Secondary | ICD-10-CM

## 2012-06-21 DIAGNOSIS — F411 Generalized anxiety disorder: Secondary | ICD-10-CM

## 2012-06-21 DIAGNOSIS — F419 Anxiety disorder, unspecified: Secondary | ICD-10-CM

## 2012-06-21 DIAGNOSIS — E878 Other disorders of electrolyte and fluid balance, not elsewhere classified: Secondary | ICD-10-CM

## 2012-06-21 LAB — COMPREHENSIVE METABOLIC PANEL
Albumin: 4.1 g/dL (ref 3.5–5.2)
BUN: 16 mg/dL (ref 6–23)
Calcium: 9.6 mg/dL (ref 8.4–10.5)
Chloride: 103 mEq/L (ref 96–112)
Glucose, Bld: 88 mg/dL (ref 70–99)
Potassium: 4.5 mEq/L (ref 3.5–5.3)

## 2012-06-21 LAB — CBC WITH DIFFERENTIAL/PLATELET
Hemoglobin: 15.5 g/dL (ref 13.0–17.0)
Lymphs Abs: 1.9 10*3/uL (ref 0.7–4.0)
Monocytes Relative: 7 % (ref 3–12)
Neutro Abs: 5.2 10*3/uL (ref 1.7–7.7)
Neutrophils Relative %: 66 % (ref 43–77)
RBC: 4.88 MIL/uL (ref 4.22–5.81)
WBC: 7.9 10*3/uL (ref 4.0–10.5)

## 2012-06-21 MED ORDER — ALPRAZOLAM 0.5 MG PO TABS: 0.5000 mg | ORAL_TABLET | Freq: Every evening | ORAL | Status: DC | PRN

## 2012-06-21 NOTE — Progress Notes (Signed)
Subjective: Here for c/o anxiety and panic attack.  Last visit here 06/2011 for physical with Dr. Susann Givens.   He notes having a major panic attack Saturday night.  Afterwards was cold, tingly, went to plug in cell phone, was trembling.  Took 1/2 xanax and aspirin. Mellowed out, went to bed, felt fine.  Been a little nervous since then.  Been having some anxiety of late.  No specific trigger.  Since Saturday worse, working on deep breathing, using other things to take mind off this.  Uses relaxation, playing guitar, spending time with friends .  He was watching [place], got excited ,and this may have triggered his anxiety.   He had been fine for months.  He is retired, lives with his god.   Wife died 4 years ago of lung cancer.  He did see hospice counselor for a while.  He denies SI, HI, no hallucinations, delusions.   None of his friends say he is acting out of Editor, commissioning.  Denies confusion, agitation, anhedonia, depression.  No other c/o.  Has been tried on other daily medications prior, but didn't tolerate these well.   Past Medical History  Diagnosis Date  . Renal stone   . CAD (coronary artery disease) 2008  . Dyslipidemia   . Hyperlipidemia   . Panic attacks     Review of Systems Constitutional: -fever, -chills, -sweats, -unexpected weight change,-fatigue ENT: -runny nose, -ear pain, -sore throat Cardiology:  -chest pain, -palpitations, -edema Respiratory: -cough, +occasional shortness of breath, -wheezing Gastroenterology: -abdominal pain, -nausea, -vomiting, -diarrhea, -constipation Hematology: -bleeding or bruising problems Musculoskeletal: -arthralgias, -myalgias, -joint swelling, -back pain Ophthalmology: -vision changes Urology: -dysuria, -difficulty urinating, -hematuria, -urinary frequency, -urgency Neurology: -headache, -weakness, -tingling, -numbness   Objective: Filed Vitals:   06/21/12 1545  BP: 130/82  Pulse: 84  Temp: 98.2 F (36.8 C)  Resp: 16    General  appearance: alert, no distress, WD/WN, lean white male  HEENT: normocephalic, sclerae anicteric, TMs pearly, nares patent, no discharge or erythema, pharynx normal Oral cavity: MMM, no lesions Neck: supple, no lymphadenopathy, no thyromegaly, no masses Heart: RRR, normal S1, S2, no murmurs Lungs: CTA bilaterally, no wheezes, rhonchi, or rales Pulses: 1+ symmetric, upper and lower extremities, normal cap refill Neuro: CN2-12 intact, DTRs normal, nonfocal exam Psych: pleasant, good eye contact, answers questions appropriately   Assessment: Encounter Diagnoses  Name Primary?  Marland Kitchen Anxiety Yes  . Panic attack   . Electrolyte abnormality     Plan: Anxiety, panic attack - refilled xanax for prn use, discussed concerns.  Discussed ways to deal with anxiety and panic attack including deep breathing playing guitar, relaxation, avoiding stressful situations.  Recheck if having worse time dealing with his anxiety.  He seems stable and occasionally has non provoked panic attacks.   Electrolyte abnormality - labs today  Follow-up pending labs, return to see Dr. Susann Givens for physical

## 2012-06-22 LAB — TSH: TSH: 1.264 u[IU]/mL (ref 0.350–4.500)

## 2012-07-13 ENCOUNTER — Encounter: Payer: Self-pay | Admitting: Family Medicine

## 2012-07-13 ENCOUNTER — Ambulatory Visit (INDEPENDENT_AMBULATORY_CARE_PROVIDER_SITE_OTHER): Payer: Medicare Other | Admitting: Family Medicine

## 2012-07-13 VITALS — BP 142/90 | HR 92 | Ht 68.5 in | Wt 152.0 lb

## 2012-07-13 DIAGNOSIS — E781 Pure hyperglyceridemia: Secondary | ICD-10-CM

## 2012-07-13 DIAGNOSIS — F419 Anxiety disorder, unspecified: Secondary | ICD-10-CM

## 2012-07-13 DIAGNOSIS — I2510 Atherosclerotic heart disease of native coronary artery without angina pectoris: Secondary | ICD-10-CM

## 2012-07-13 DIAGNOSIS — Z0000 Encounter for general adult medical examination without abnormal findings: Secondary | ICD-10-CM

## 2012-07-13 DIAGNOSIS — F411 Generalized anxiety disorder: Secondary | ICD-10-CM

## 2012-07-13 DIAGNOSIS — Z789 Other specified health status: Secondary | ICD-10-CM | POA: Insufficient documentation

## 2012-07-13 LAB — POCT URINALYSIS DIPSTICK
Bilirubin, UA: NEGATIVE
Glucose, UA: NEGATIVE
Leukocytes, UA: NEGATIVE
Nitrite, UA: NEGATIVE
Urobilinogen, UA: NEGATIVE
pH, UA: 7

## 2012-07-13 LAB — HEMOCCULT GUIAC POC 1CARD (OFFICE)

## 2012-07-13 NOTE — Progress Notes (Signed)
  Subjective:    Patient ID: Matthew Moreno, male    DOB: 1945-06-18, 67 y.o.   MRN: [id number]  HPI He is here for a complete examination.he has enjoyed excellent health. He keeps himself quite busy. He has had difficulty in the past with statin drugs including Crestor, Lipitor, Zocor. All of which have caused muscle aches and pains. He also had difficulty with fenofibrate doing the same thing. He does see the cardiologist regularly. The cardiologist did give him Livalo however he has not started taking it yet. He does have evidence of coronary disease. He continues on aspirin every day. He rarely takes a Xanax.  Colonoscopy was in 2010. He is up-to-date on his immunizations. He does have a living will.  Review of Systems  Constitutional: Negative.   HENT: Negative.   Eyes: Negative.   Respiratory: Negative.   Cardiovascular: Negative.   Gastrointestinal: Negative.   Endocrine: Negative.   Genitourinary: Negative.   Musculoskeletal: Negative.   Allergic/Immunologic: Negative.   Neurological: Negative.   Hematological: Negative.   Psychiatric/Behavioral: Negative.        Objective:   Physical Exam BP 142/90  Pulse 92  Ht 5' 8.5" (1.74 m)  Wt 152 lb (68.947 kg)  BMI 22.77 kg/m2  General Appearance:    Alert, cooperative, no distress, appears stated age  Head:    Normocephalic, without obvious abnormality, atraumatic  Eyes:    PERRL, conjunctiva/corneas clear, EOM's intact, fundi    benign  Ears:    Normal TM's and external ear canals  Nose:   Nares normal, mucosa normal, no drainage or sinus   tenderness  Throat:   Lips, mucosa, and tongue normal; teeth and gums normal  Neck:   Supple, no lymphadenopathy;  thyroid:  no   enlargement/tenderness/nodules; no carotid   bruit or JVD  Back:    Spine nontender, no curvature, ROM normal, no CVA     tenderness  Lungs:     Clear to auscultation bilaterally without wheezes, rales or     ronchi; respirations unlabored  Chest Wall:     No tenderness or deformity   Heart:    Regular rate and rhythm, S1 and S2 normal, no murmur, rub   or gallop  Breast Exam:    No chest wall tenderness, masses or gynecomastia  Abdomen:     Soft, non-tender, nondistended, normoactive bowel sounds,    no masses, no hepatosplenomegaly  Genitalia:  deferred  Rectal:    Normal sphincter tone, no masses or tenderness; guaiac negative stool.  Prostate smooth, no nodules, not enlarged.  Extremities:   No clubbing, cyanosis or edema  Pulses:   2+ and symmetric all extremities  Skin:   Skin color, texture, turgor normal, no rashes or lesions.multiple tattoos are present  Lymph nodes:   Cervical, supraclavicular, and axillary nodes normal  Neurologic:   CNII-XII intact, normal strength, sensation and gait; reflexes 2+ and symmetric throughout          Psych:   Normal mood, affect, hygiene and grooming.          Assessment & Plan:  Routine general medical examination at a health care facility - Plan: POCT Urinalysis Dipstick, Hemoccult - 1 Card (office)  Hypertriglyceridemia - Plan: Lipid panel  Anxiety  CAD (coronary artery disease)  Statin intolerance did recommend he try the Livalo and libido work. Otherwise he is to continue to take good care of himself.

## 2012-07-13 NOTE — Patient Instructions (Signed)
Try the Livalo and let me know how works

## 2012-07-14 LAB — LIPID PANEL
HDL: 29 mg/dL — ABNORMAL LOW (ref >39)
Triglycerides: 1458 mg/dL — ABNORMAL HIGH (ref <150)

## 2012-07-15 NOTE — Progress Notes (Signed)
Quick Note:  PT STATED HE STARTED THE LIVALO YESTERDAY AND IF HE HAS ANY PROBLEMS HE WILL CALL ALSO HE WANTED ME TO MAIL HE WANTED ME TO MAIL A TRIGLY.DIET AND COPY OF LABS ______

## 2012-07-28 ENCOUNTER — Telehealth: Payer: Self-pay | Admitting: Internal Medicine

## 2012-07-28 NOTE — Telephone Encounter (Signed)
He has been on Livalo but states that he feels quite fatigued. I recommended that he stop the medication and call me Monday.

## 2012-07-28 NOTE — Telephone Encounter (Signed)
Pt called stating he is taking livalo and has been on it for about 2 weeks and he is tired and dont feel like doing anything and wants to know if that's a side effect and if he should still continue to take it or stop the medicine.

## 2012-11-03 ENCOUNTER — Encounter: Payer: Self-pay | Admitting: Internal Medicine

## 2012-11-03 ENCOUNTER — Ambulatory Visit (INDEPENDENT_AMBULATORY_CARE_PROVIDER_SITE_OTHER): Payer: Medicare Other | Admitting: Internal Medicine

## 2012-11-03 VITALS — BP 138/84 | HR 63 | Ht 68.0 in | Wt 141.8 lb

## 2012-11-03 DIAGNOSIS — E781 Pure hyperglyceridemia: Secondary | ICD-10-CM

## 2012-11-03 DIAGNOSIS — Z789 Other specified health status: Secondary | ICD-10-CM

## 2012-11-03 DIAGNOSIS — I2510 Atherosclerotic heart disease of native coronary artery without angina pectoris: Secondary | ICD-10-CM

## 2012-11-03 NOTE — Patient Instructions (Addendum)
Your physician wants you to follow-up in: 1 YEAR You will receive a reminder letter in the mail two months in advance. If you don't receive a letter, please call our office to schedule the follow-up appointment.  

## 2012-11-03 NOTE — Progress Notes (Signed)
OFFICE NOTE  Chief Complaint:  Routine followup  Primary Care Physician: Matthew Herter, MD  HPI:  Matthew Moreno is a 67 year old gentleman we are following for history of coronary disease, moderate, in 2010 on catheterization, 50% LAD, 40% RCA and 50% OM lesion. He also has hypertension difficult to control, dyslipidemia and intolerance to statins. He has returned today with worsening leg pain which now he is attributing to fenofibrate. His most recent lipid profile obtained on April 11, 2012, showed total cholesterol 241, triglycerides 217, LDL was 158, HDL 40. His triglycerides are actually down from about 400 last year with the fenofibrate so that seems to be working. Unfortunately, he was directed to take Livalo 2 mg daily last year at his last visit but did not start that and is now contemplating discontinuing the fenofibrate as he is concerned this is causing his muscle pain. Since his last visit, he has stopped both the little and fenofibrate. Prior to his last blood work, he went out with some friends and was drinking bourbon in his triglycerides were 1400. Therefore difficult to know what his LDL actually is without measuring a direct LDL. We discussed options as alternatives to statins including WelChol and his idea, however he is not interested in any further medications.  PMHx:  Past Medical History  Diagnosis Date  . Renal stone   . CAD (coronary artery disease) 2008  . Dyslipidemia   . Hyperlipidemia   . Panic attacks     Past Surgical History  Procedure Laterality Date  . Colonoscopy  2010    Dr. Christella Hartigan    FAMHx:  No family history on file.  SOCHx:   reports that he has quit smoking. His smoking use included Cigarettes. He smoked 0.00 packs per day. He has quit using smokeless tobacco. He reports that he drinks about 1.8 ounces of alcohol per week. He reports that he does not use illicit drugs.  ALLERGIES:  Allergies  Allergen Reactions  . Sulfonamide  Derivatives     REACTION: hives    ROS: A comprehensive review of systems was negative except for: Respiratory: positive for dyspnea on exertion  HOME MEDS: Current Outpatient Prescriptions  Medication Sig Dispense Refill  . ALPRAZolam (XANAX) 0.5 MG tablet Take 1 tablet (0.5 mg total) by mouth at bedtime as needed.  30 tablet  4  . aspirin 81 MG tablet Take 160 mg by mouth daily.      Marland Kitchen GARLIC PO Take 1 capsule by mouth daily.      . metoprolol tartrate (LOPRESSOR) 25 MG tablet Take 12.5 mg by mouth 2 (two) times daily.      . Multiple Vitamins-Minerals (MULTIVITAMIN WITH MINERALS) tablet Take 1 tablet by mouth daily.      . Omega-3 Fatty Acids (FISH OIL) 1000 MG CAPS Take 1,000 mg by mouth 2 (two) times daily.       No current facility-administered medications for this visit.   Facility-Administered Medications Ordered in Other Visits  Medication Dose Route Frequency Provider Last Rate Last Dose  . influenza  inactive virus vaccine (FLUZONE/FLUARIX) injection 0.5 mL  0.5 mL Intramuscular Once Ronnald Nian, MD        LABS/IMAGING: No results found for this or any previous visit (from the past 48 hour(s)). No results found.  VITALS: BP 138/84  Pulse 63  Ht 5\' 8"  (1.727 m)  Wt 141 lb 12.8 oz (64.32 kg)  BMI 21.57 kg/m2  EXAM: General appearance: alert and no distress  Neck: no adenopathy, no carotid bruit, no JVD, supple, symmetrical, trachea midline and thyroid not enlarged, symmetric, no tenderness/mass/nodules Lungs: clear to auscultation bilaterally Heart: regular rate and rhythm, S1, S2 normal, no murmur, click, rub or gallop Abdomen: soft, non-tender; bowel sounds normal; no masses,  no organomegaly Extremities: extremities normal, atraumatic, no cyanosis or edema Pulses: 2+ and symmetric Skin: Skin color, texture, turgor normal. No rashes or lesions Neurologic: Grossly normal Psych: Mood, affect normal  EKG: Normal sinus rhythm at 63  ASSESSMENT: 1. Moderate  nonobstructive coronary artery disease 2. Hypertension 3. Dyslipidemia 4. Statin intolerant  PLAN: 1.   [name] continues to do well and is asymptomatic. He is quite active, working in a long millimeters this doing frequent gardening. He has had no episodes of chest pain. He gets mildly short of breath mostly at night and I think this is related to his smoking. He is not interested in any other alternatives to statins at this time due to side effects. We'll continue to monitor his symptoms and he realizes he is at increased risk of obstructive coronary disease due to his poorly controlled lipid profile. Plan to see him back annually or sooner if necessary.  [name], MD, [facility] Attending Cardiologist [facility]  Matthew Moreno 11/03/2012, 9:35 AM

## 2013-01-11 ENCOUNTER — Encounter: Payer: Self-pay | Admitting: Medical

## 2013-01-11 ENCOUNTER — Ambulatory Visit (INDEPENDENT_AMBULATORY_CARE_PROVIDER_SITE_OTHER): Payer: Medicare Other | Admitting: Medical

## 2013-01-11 VITALS — BP 110/70 | HR 68 | Temp 98.1°F | Resp 16 | Wt 142.0 lb

## 2013-01-11 DIAGNOSIS — H6120 Impacted cerumen, unspecified ear: Secondary | ICD-10-CM

## 2013-01-11 DIAGNOSIS — H6122 Impacted cerumen, left ear: Secondary | ICD-10-CM

## 2013-01-11 NOTE — Patient Instructions (Signed)
Cerumen Plug A cerumen plug is having too much wax in your ear canal. The outer ear canal is lined with hairs and glands that secrete wax. This wax is called cerumen. This protects the ear canal. It also helps prevent material from entering the ear. Too much wax can cause a feeling of fullness in the ears, decreased hearing, ringing in the ears, or an earache. Sometimes your caregiver will remove a cerumen plug with an instrument called a curette. Or he/she may flush the ear canal with warm water from a syringe to remove the wax. You may simply be sent home to follow the home care instructions below for wax removal. Generally ear wax does not have to be removed unless it is causing a problem such as one of those listed above. When too much wax is causing a problem, the following are a few home remedies which can be used to help this problem. HOME CARE INSTRUCTIONS   Put a couple drops of glycerin, baby oil, or mineral oil in the ear a couple times of day. Do this every day for several days. After putting the drops in, you will need to lay with the affected ear pointing up for a couple minutes. This allows the drops to remain in the canal and run down to the area of wax blockage. This will soften the wax plug. It may also make your hearing worse as the wax softens and blocks the canal even more.  After a couple days, you may gently flush the ear canal with warm water from a syringe. Do this by pulling your ear up and back with your head tilted slightly forward and towards a pan to catch the water. This is most easily done with a helper. You can also accomplish the same thing by letting the shower beat into your ear canal to wash the wax out. Sometimes this will not be immediately successful. You will have to return to the first step of using the oil to further soften the wax. Then resume washing the ear canal out with a syringe or shower.  Following removal of the wax, put ten to twenty drops of rubbing  alcohol into the outer ears. This will dry the canal and prevent an infection.  Do not irrigate or wash out your ears if you have had a perforated ear drum or mastoid surgery. SEEK IMMEDIATE MEDICAL CARE IF:   You are unsuccessful with the above instructions for home care.  You develop ear pain or drainage from the ear. MAKE SURE YOU:   Understand these instructions.  Will watch your condition.  Will get help right away if you are not doing well or get worse. Document Released: 01/27/2001 Document Revised: 07/27/2011 Document Reviewed: 04/25/2008 [employer] Patient Information 2014 [employer].  

## 2013-01-11 NOTE — Progress Notes (Signed)
Subjective:   Here for complaint of earwax buildup in left ear.  Symptoms include ear muffling/decreased hearing.   male reports prior history of similar.  No other aggravating or relieving factors.  No other complaint.  Review of Systems Constitutional: denies fever, chills, sweats ENT: no runny nose, ear pain, sore throat, hoarseness, sinus pain, teeth pain, tinnitus, hearing loss Gastroenterology: denies nausea, vomiting     Objective:   Physical Exam  General appearance: alert, no distress, WD/WN Ears: left ear canal with impacted cerumen, right ear canal with moderate cerumen.  Right TM normal, left TM not visible. HENT: conjunctiva/corneas normal, sclerae anicteric, nares patent, no discharge or erythema, pharynx normal Oral cavity: MMM, tongue normal, teeth normal Neurological: Hearing normal bilaterally to whisper    Assessment & Plan:    Encounter Diagnosis  Name Primary?  . Impacted cerumen of left ear Yes   Discussed findings.  Discussed risk/benefits of procedure and patient agrees to procedure. Successfully used warm water lavage to remove impacted cerumen from both ear canals. Patient tolerated procedure well. Advised they avoid using any cotton swabs or other devices to clean the ear canals.  Use basic hygiene as discussed.  Follow up prn.

## 2013-03-07 ENCOUNTER — Other Ambulatory Visit: Payer: Self-pay | Admitting: Internal Medicine

## 2013-03-07 NOTE — Telephone Encounter (Signed)
Rx was sent to pharmacy electronically. 

## 2013-03-13 ENCOUNTER — Other Ambulatory Visit (INDEPENDENT_AMBULATORY_CARE_PROVIDER_SITE_OTHER): Payer: Medicare Other

## 2013-03-13 DIAGNOSIS — Z23 Encounter for immunization: Secondary | ICD-10-CM

## 2013-06-01 ENCOUNTER — Encounter: Payer: Self-pay | Admitting: Family Medicine

## 2013-06-01 ENCOUNTER — Ambulatory Visit (INDEPENDENT_AMBULATORY_CARE_PROVIDER_SITE_OTHER): Payer: Medicare Other | Admitting: Family Medicine

## 2013-06-01 VITALS — BP 160/100 | HR 60 | Temp 98.1°F | Ht 66.0 in | Wt 149.0 lb

## 2013-06-01 DIAGNOSIS — IMO0002 Reserved for concepts with insufficient information to code with codable children: Secondary | ICD-10-CM

## 2013-06-01 MED ORDER — CEPHALEXIN 500 MG PO CAPS: 500.0000 mg | ORAL_CAPSULE | Freq: Three times a day (TID) | ORAL | Status: DC

## 2013-06-01 NOTE — Patient Instructions (Addendum)
Paronychia Paronychia is an inflammatory reaction involving the folds of the skin surrounding the fingernail. This is commonly caused by an infection in the skin around a nail. The most common cause of paronychia is frequent wetting of the hands (as seen with bartenders, food servers, nurses or others who wet their hands). This makes the skin around the fingernail susceptible to infection by bacteria (germs) or fungus. Other predisposing factors are:  Aggressive manicuring.  Nail biting.  Thumb sucking. The most common cause is a staphylococcal (a type of germ) infection, or a fungal (Candida) infection. When caused by a germ, it usually comes on suddenly with redness, swelling, pus and is often painful. It may get under the nail and form an abscess (collection of pus), or form an abscess around the nail. If the nail itself is infected with a fungus, the treatment is usually prolonged and may require oral medicine for up to one year. Your caregiver will determine the length of time treatment is required. The paronychia caused by bacteria (germs) may largely be avoided by not pulling on hangnails or picking at cuticles. When the infection occurs at the tips of the finger it is called felon. When the cause of paronychia is from the herpes simplex virus (HSV) it is called herpetic whitlow. TREATMENT  When an abscess is present treatment is often incision and drainage. This means that the abscess must be cut open so the pus can get out. When this is done, the following home care instructions should be followed. HOME CARE INSTRUCTIONS   It is important to keep the affected fingers very dry. Rubber or plastic gloves over cotton gloves should be used whenever the hand must be placed in water.  Keep wound clean, dry and dressed as suggested by your caregiver between warm soaks or warm compresses.  Soak in warm water for fifteen to twenty minutes three to four times per day for bacterial infections. Fungal  infections are very difficult to treat, so often require treatment for long periods of time.  For bacterial (germ) infections take antibiotics (medicine which kill germs) as directed and finish the prescription, even if the problem appears to be solved before the medicine is gone.  Only take over-the-counter or prescription medicines for pain, discomfort, or fever as directed by your caregiver. SEEK IMMEDIATE MEDICAL CARE IF:  You have redness, swelling, or increasing pain in the wound.  You notice pus coming from the wound.  You have a fever.  You notice a bad smell coming from the wound or dressing. Document Released: 10/28/2000 Document Revised: 07/27/2011 Document Reviewed: 06/29/2008 [facility] Patient Information 2014 [place].   Soak finger 2-3 times/day.  Take antibiotics as prescribed. Return if increasing pain, swelling, fever (might need to be lanced).  Check blood pressure at home--to ensure that it really isn't running as high as here in the office today

## 2013-06-01 NOTE — Progress Notes (Signed)
Chief Complaint  Patient presents with  . Hand Pain    right thumb has an infection on it. Started with some soreness about 2 weeks ago, now there is a spot that he thinks looks infected.    Two weeks ago, while washing dishes, he thought he cut himself with a knife. He had acute sharp pain,  He never noticed any true cut, or bleeding, thinks maybe there was a slight cut under the nail.  He soaked it in epsom salts, used some neosporin.  He has since developed increasing redness and pain.  He denies any fevers.  He hasn't had any drainage.    Past Medical History  Diagnosis Date  . Renal stone   . CAD (coronary artery disease) 2008  . Dyslipidemia   . Hyperlipidemia   . Panic attacks    Past Surgical History  Procedure Laterality Date  . Colonoscopy  2010    Dr. Ardis Hughs   History   Social History  . Marital Status: Widowed    Spouse Name: N/A    Number of Children: N/A  . Years of Education: N/A   Occupational History  . Not on file.   Social History Main Topics  . Smoking status: Former Smoker    Types: Cigarettes  . Smokeless tobacco: Former Systems developer  . Alcohol Use: 1.8 oz/week    3 Cans of beer per week  . Drug Use: No  . Sexual Activity: Not Currently   Other Topics Concern  . Not on file   Social History Narrative  . No narrative on file   Current outpatient prescriptions:ALPRAZolam (XANAX) 0.5 MG tablet, Take 1 tablet (0.5 mg total) by mouth at bedtime as needed., Disp: 30 tablet, Rfl: 4;  aspirin 81 MG tablet, Take 160 mg by mouth daily., Disp: , Rfl: ;  GARLIC PO, Take 1 capsule by mouth daily., Disp: , Rfl: ;  metoprolol tartrate (LOPRESSOR) 25 MG tablet, TAKE 1/2 TABLET TWICE DAILY, Disp: 30 tablet, Rfl: 6 Multiple Vitamins-Minerals (MULTIVITAMIN WITH MINERALS) tablet, Take 1 tablet by mouth daily., Disp: , Rfl: ;  Omega-3 Fatty Acids (FISH OIL) 1000 MG CAPS, Take 1,000 mg by mouth 2 (two) times daily., Disp: , Rfl:  No current facility-administered medications  for this visit. Facility-Administered Medications Ordered in Other Visits: influenza  inactive virus vaccine (FLUZONE/FLUARIX) injection 0.5 mL, 0.5 mL, Intramuscular, Once, Denita Lung, MD  Allergies  Allergen Reactions  . Sulfonamide Derivatives     REACTION: hives   ROS:  Denies fevers, chills, headaches, dizziness, chest pain, URI symptoms ,GI complaints, or other concerns.  PHYSICAL EXAM: BP 160/100  Pulse 60  Temp(Src) 98.1 F (36.7 C) (Oral)  Ht 5\' 6"  (1.676 m)  Wt 149 lb (67.586 kg)  BMI 24.06 kg/m2 Well developed, pleasant male in no distress  Right thumb: lateral aspect of the finger, next to the nail, there is erythema, swelling and warmth.  There is no pustule, drainage or fluctuance Brisk capillary refill  ASSESSMENT/PLAN: Paronychia - Plan: cephALEXin (KEFLEX) 500 MG capsule  Advised warm soaks, antibiotics.  Return for I&D if increases in size, tenderness, not responding to abx.  Elevated BP--higher on repeat.  Has known white coat HTN.  Advised to check BP's at home to ensure that they are not truly running high

## 2013-07-06 ENCOUNTER — Other Ambulatory Visit: Payer: Self-pay | Admitting: *Deleted

## 2013-07-06 MED ORDER — METOPROLOL TARTRATE 25 MG PO TABS: ORAL_TABLET | ORAL | Status: DC

## 2013-07-06 NOTE — Telephone Encounter (Signed)
Rx was sent to pharmacy electronically. 

## 2013-09-07 ENCOUNTER — Encounter: Payer: Self-pay | Admitting: Family Medicine

## 2013-09-07 ENCOUNTER — Ambulatory Visit (INDEPENDENT_AMBULATORY_CARE_PROVIDER_SITE_OTHER): Payer: Medicare Other | Admitting: Family Medicine

## 2013-09-07 VITALS — BP 140/82 | HR 68 | Ht 66.25 in | Wt 150.0 lb

## 2013-09-07 DIAGNOSIS — Z789 Other specified health status: Secondary | ICD-10-CM

## 2013-09-07 DIAGNOSIS — Z0000 Encounter for general adult medical examination without abnormal findings: Secondary | ICD-10-CM

## 2013-09-07 DIAGNOSIS — I2510 Atherosclerotic heart disease of native coronary artery without angina pectoris: Secondary | ICD-10-CM

## 2013-09-07 DIAGNOSIS — Z87891 Personal history of nicotine dependence: Secondary | ICD-10-CM

## 2013-09-07 DIAGNOSIS — E781 Pure hyperglyceridemia: Secondary | ICD-10-CM

## 2013-09-07 LAB — COMPREHENSIVE METABOLIC PANEL
ALBUMIN: 4.1 g/dL (ref 3.5–5.2)
ALK PHOS: 63 U/L (ref 39–117)
ALT: 32 U/L (ref 0–53)
AST: 28 U/L (ref 0–37)
BUN: 14 mg/dL (ref 6–23)
CHLORIDE: 101 meq/L (ref 96–112)
CO2: 26 mEq/L (ref 19–32)
Calcium: 9 mg/dL (ref 8.4–10.5)
Creat: 0.88 mg/dL (ref 0.50–1.35)
Glucose, Bld: 95 mg/dL (ref 70–99)
POTASSIUM: 4.1 meq/L (ref 3.5–5.3)
SODIUM: 138 meq/L (ref 135–145)
Total Bilirubin: 0.6 mg/dL (ref 0.2–1.2)
Total Protein: 7.4 g/dL (ref 6.0–8.3)

## 2013-09-07 LAB — LIPID PANEL
Cholesterol: 248 mg/dL — ABNORMAL HIGH (ref 0–200)
HDL: 37 mg/dL — ABNORMAL LOW (ref >39)
Total CHOL/HDL Ratio: 6.7 Ratio
Triglycerides: 687 mg/dL — ABNORMAL HIGH (ref <150)

## 2013-09-07 LAB — CBC WITH DIFFERENTIAL/PLATELET
BASOS ABS: 0.1 10*3/uL (ref 0.0–0.1)
BASOS PCT: 1 % (ref 0–1)
Eosinophils Absolute: 0.1 10*3/uL (ref 0.0–0.7)
Eosinophils Relative: 1 % (ref 0–5)
HCT: 45 % (ref 39.0–52.0)
Hemoglobin: 15.7 g/dL (ref 13.0–17.0)
LYMPHS PCT: 29 % (ref 12–46)
Lymphs Abs: 1.6 10*3/uL (ref 0.7–4.0)
MCH: 33.1 pg (ref 26.0–34.0)
MCHC: 34.9 g/dL (ref 30.0–36.0)
MCV: 94.7 fL (ref 78.0–100.0)
Monocytes Absolute: 0.6 10*3/uL (ref 0.1–1.0)
Monocytes Relative: 10 % (ref 3–12)
NEUTROS PCT: 59 % (ref 43–77)
Neutro Abs: 3.2 10*3/uL (ref 1.7–7.7)
PLATELETS: 240 10*3/uL (ref 150–400)
RBC: 4.75 MIL/uL (ref 4.22–5.81)
RDW: 13.7 % (ref 11.5–15.5)
WBC: 5.5 10*3/uL (ref 4.0–10.5)

## 2013-09-07 LAB — POCT URINALYSIS DIPSTICK
BILIRUBIN UA: NEGATIVE
GLUCOSE UA: NEGATIVE
KETONES UA: NEGATIVE
LEUKOCYTES UA: NEGATIVE
NITRITE UA: NEGATIVE
PH UA: 5
Protein, UA: NEGATIVE
RBC UA: NEGATIVE
Spec Grav, UA: 1.01
Urobilinogen, UA: NEGATIVE

## 2013-09-07 NOTE — Progress Notes (Signed)
   Subjective:    Patient ID: Matthew Moreno, male    DOB: 03-20-1946, 68 y.o.   MRN: [id number]  HPI He is here for complete examination. He has enjoyed excellent health. He has very little difficulty with his anxiety. He has not used Xanax in quite some time. He does occasionally smoke marijuana when he gets a little anxious but this occurs every few months. He does have a history of coronary disease however he also is statin intolerant. He is also tried fibroids and was intolerant of them as well. He no longer smokes. Social and family history were reviewed and are unchanged. He is retired and is enjoying his retirement. His immunizations and health maintenance are up to date.   Review of Systems  All other systems reviewed and are negative.      Objective:   Physical Exam BP 140/82  Pulse 68  Ht 5' 6.25" (1.683 m)  Wt 150 lb (68.04 kg)  BMI 24.02 kg/m2  General Appearance:    Alert, cooperative, no distress, appears stated age  Head:    Normocephalic, without obvious abnormality, atraumatic  Eyes:    PERRL, conjunctiva/corneas clear, EOM's intact, fundi    benign  Ears:    Normal TM's and external ear canals  Nose:   Nares normal, mucosa normal, no drainage or sinus   tenderness  Throat:   Lips, mucosa, and tongue normal; teeth and gums normal  Neck:   Supple, no lymphadenopathy;  thyroid:  no   enlargement/tenderness/nodules; no carotid   bruit or JVD  Back:    Spine nontender, no curvature, ROM normal, no CVA     tenderness  Lungs:     Clear to auscultation bilaterally without wheezes, rales or     ronchi; respirations unlabored  Chest Wall:    No tenderness or deformity   Heart:    Regular rate and rhythm, S1 is normal however S2 is quite loud, no murmur, rub   or gallop  Breast Exam:    No chest wall tenderness, masses or gynecomastia  Abdomen:     Soft, non-tender, nondistended, normoactive bowel sounds,    no masses, no hepatosplenomegaly  Genitalia:  deferred    Rectal:  defferred  Extremities:   No clubbing, cyanosis or edema  Pulses:   2+ and symmetric all extremities  Skin:   Skin color, texture, turgor normal, no rashes or lesions  Lymph nodes:   Cervical, supraclavicular, and axillary nodes normal  Neurologic:   CNII-XII intact, normal strength, sensation and gait; reflexes 2+ and symmetric throughout          Psych:   Normal mood, affect, hygiene and grooming.          Assessment & Plan:  Routine general medical examination at a health care facility - Plan: POCT urinalysis dipstick, CBC with Differential, Comprehensive metabolic panel, Lipid panel  Statin intolerance - Plan: Lipid panel  Hypertriglyceridemia - Plan: Lipid panel  Former smoker - Plan: CBC with Differential, Comprehensive metabolic panel  CAD (coronary artery disease) - Plan: CBC with Differential, Comprehensive metabolic panel, Lipid panel  I encouraged him to do to take good care of himself. He will followup with his cardiologist.

## 2013-11-01 ENCOUNTER — Encounter: Payer: Self-pay | Admitting: Internal Medicine

## 2013-11-01 ENCOUNTER — Encounter: Payer: Self-pay | Admitting: *Deleted

## 2013-11-06 ENCOUNTER — Ambulatory Visit (INDEPENDENT_AMBULATORY_CARE_PROVIDER_SITE_OTHER): Payer: Medicare Other | Admitting: Internal Medicine

## 2013-11-06 ENCOUNTER — Encounter: Payer: Self-pay | Admitting: Internal Medicine

## 2013-11-06 VITALS — BP 136/80 | HR 64 | Ht 67.0 in | Wt 150.4 lb

## 2013-11-06 DIAGNOSIS — I25119 Atherosclerotic heart disease of native coronary artery with unspecified angina pectoris: Secondary | ICD-10-CM

## 2013-11-06 DIAGNOSIS — Z789 Other specified health status: Secondary | ICD-10-CM

## 2013-11-06 DIAGNOSIS — Z888 Allergy status to other drugs, medicaments and biological substances status: Secondary | ICD-10-CM

## 2013-11-06 DIAGNOSIS — E781 Pure hyperglyceridemia: Secondary | ICD-10-CM

## 2013-11-06 DIAGNOSIS — I209 Angina pectoris, unspecified: Secondary | ICD-10-CM

## 2013-11-06 DIAGNOSIS — I2510 Atherosclerotic heart disease of native coronary artery without angina pectoris: Secondary | ICD-10-CM

## 2013-11-06 DIAGNOSIS — E785 Hyperlipidemia, unspecified: Secondary | ICD-10-CM

## 2013-11-06 MED ORDER — FENOFIBRATE 160 MG PO TABS: 160.0000 mg | ORAL_TABLET | Freq: Every day | ORAL | Status: DC

## 2013-11-06 NOTE — Patient Instructions (Signed)
Your physician has recommended you make the following change in your medication: START fenofibrate 160mg  once daily.   Your physician recommends that you return for lab work in: 3 months (fasting)  Your physician recommends that you schedule a follow-up appointment in: 1 year with Dr. Debara Pickett

## 2013-11-16 ENCOUNTER — Encounter: Payer: Self-pay | Admitting: Internal Medicine

## 2013-11-16 NOTE — Progress Notes (Signed)
OFFICE NOTE  Chief Complaint:  Routine followup  Primary Care Physician: Matthew Haste, MD  HPI:  Matthew Moreno is a 68 year old gentleman we are following for history of coronary disease, moderate, in 2010 on catheterization, 50% LAD, 40% RCA and 50% OM lesion. He also has hypertension difficult to control, dyslipidemia and intolerance to statins. He has returned today with worsening leg pain which now he is attributing to fenofibrate. His most recent lipid profile obtained on April 11, 2012, showed total cholesterol 241, triglycerides 217, LDL was 158, HDL 40. His triglycerides are actually down from about 400 last year with the fenofibrate so that seems to be working. Unfortunately, he was directed to take Livalo 2 mg daily last year at his last visit but did not start that and is now contemplating discontinuing the fenofibrate as he is concerned this is causing his muscle pain. Since his last visit, he has stopped both the little and fenofibrate. Prior to his last blood work, he went out with some friends and was drinking bourbon in his triglycerides were 1400. Therefore difficult to know what his LDL actually is without measuring a direct LDL. We discussed options as alternatives to statins including WelChol and his idea, however he is not interested in any further medications.  Matthew Moreno has no new complaints today. He is still not interested in any alternative treatments for his cholesterol. He denies any chest pain or worsening shortness of breath.  PMHx:  Past Medical History  Diagnosis Date  . Renal stone   . CAD (coronary artery disease) 2008  . Dyslipidemia   . Hyperlipidemia   . Panic attacks   . Family history of heart disease   . Hypertension   . Statin intolerance     Past Surgical History  Procedure Laterality Date  . Colonoscopy  2010    Matthew Moreno  . Biceps tendon repair Left   . Cardiac catheterization  06/19/2008    normal left main,  Cfx with 3 OMs (50% osital narrowing in OM1), LAD densely calcified in the proximal half of vessel with eccentric narrowing in mid region (40-50% severity), diag 1 & 2 free of disease, RCA with less than 30% narrowing in midportion (Dr. Rockne Moreno)    FAMHx:  Family History  Problem Relation Age of Onset  . Heart Problems Mother   . Heart Problems Father     SOCHx:   reports that he quit smoking about 30 years ago. His smoking use included Cigarettes. He smoked 0.00 packs per day. He has quit using smokeless tobacco. He reports that he drinks about 1.8 ounces of alcohol per week. He reports that he does not use illicit drugs.  ALLERGIES:  Allergies  Allergen Reactions  . Bystolic [Nebivolol Hcl]   . Lipitor [Atorvastatin]   . Lisinopril   . Metoprolol   . Pravastatin   . Red Yeast Rice [Cholestin]   . Sulfonamide Derivatives     REACTION: hives  . Zocor [Simvastatin]     ROS: A comprehensive review of systems was negative.  HOME MEDS: Current Outpatient Prescriptions  Medication Sig Dispense Refill  . ALPRAZolam (XANAX) 0.5 MG tablet Take 1 tablet (0.5 mg total) by mouth at bedtime as needed.  30 tablet  4  . aspirin 81 MG tablet Take 160 mg by mouth daily.      . cholecalciferol (VITAMIN D) 1000 UNITS tablet Take 1,000 Units by mouth daily.      Marland Kitchen GARLIC PO Take  1 capsule by mouth daily.      . metoprolol tartrate (LOPRESSOR) 25 MG tablet TAKE 1/2 TABLET (12.5mg ) BY MOUTH TWICE DAILY  90 tablet  1  . Multiple Vitamins-Minerals (MULTIVITAMIN WITH MINERALS) tablet Take 1 tablet by mouth daily.      . Omega-3 Fatty Acids (FISH OIL) 1000 MG CAPS Take 1,000 mg by mouth 2 (two) times daily.      . fenofibrate 160 MG tablet Take 1 tablet (160 mg total) by mouth daily.  30 tablet  6   No current facility-administered medications for this visit.   Facility-Administered Medications Ordered in Other Visits  Medication Dose Route Frequency Provider Last Rate Last Dose  . influenza   inactive virus vaccine (FLUZONE/FLUARIX) injection 0.5 mL  0.5 mL Intramuscular Once Matthew Lung, MD        LABS/IMAGING: No results found for this or any previous visit (from the past 48 hour(s)). No results found.  VITALS: BP 136/80  Pulse 64  Ht 5\' 7"  (1.702 m)  Wt 150 lb 6.4 oz (68.221 kg)  BMI 23.55 kg/m2  EXAM: General appearance: alert and no distress Neck: no adenopathy, no carotid bruit, no JVD, supple, symmetrical, trachea midline and thyroid not enlarged, symmetric, no tenderness/mass/nodules Lungs: clear to auscultation bilaterally Heart: regular rate and rhythm, S1, S2 normal, no murmur, click, rub or gallop Abdomen: soft, non-tender; bowel sounds normal; no masses,  no organomegaly Extremities: extremities normal, atraumatic, no cyanosis or edema Pulses: 2+ and symmetric Skin: Skin color, texture, turgor normal. No rashes or lesions Neurologic: Grossly normal Psych: Mood, affect normal  EKG: Normal sinus rhythm at 64  ASSESSMENT: 1. Moderate nonobstructive coronary artery disease 2. Hypertension 3. Dyslipidemia 4. Statin intolerant  PLAN: 1.   Matthew Moreno continues to do well and is asymptomatic. He gets mildly short of breath mostly at night and I think this is related to his smoking. He is not interested in any other alternatives to statins at this time due to side effects. I did mention that we have new medications called PCSK9 inhibitors - and are in rolling patients in a clinical trial. He is not interested in that. He was receptive to trying fenofibrate again. Hopefully if he tolerates this, it will reduce his risk somewhat. Plan to see him back annually or sooner if necessary.  Matthew Casino, MD, [facility] Attending Cardiologist [facility] 11/16/2013, 5:37 PM

## 2013-12-04 ENCOUNTER — Encounter: Payer: Self-pay | Admitting: Family Medicine

## 2013-12-04 ENCOUNTER — Ambulatory Visit (INDEPENDENT_AMBULATORY_CARE_PROVIDER_SITE_OTHER): Payer: Medicare Other | Admitting: Family Medicine

## 2013-12-04 VITALS — BP 148/78 | HR 72 | Ht 66.0 in | Wt 152.0 lb

## 2013-12-04 DIAGNOSIS — H6120 Impacted cerumen, unspecified ear: Secondary | ICD-10-CM

## 2013-12-04 DIAGNOSIS — H6123 Impacted cerumen, bilateral: Secondary | ICD-10-CM

## 2013-12-04 DIAGNOSIS — H918X9 Other specified hearing loss, unspecified ear: Secondary | ICD-10-CM

## 2013-12-04 DIAGNOSIS — R609 Edema, unspecified: Secondary | ICD-10-CM

## 2013-12-04 DIAGNOSIS — H6122 Impacted cerumen, left ear: Secondary | ICD-10-CM

## 2013-12-04 DIAGNOSIS — K118 Other diseases of salivary glands: Secondary | ICD-10-CM

## 2013-12-04 NOTE — Patient Instructions (Signed)
Cerumen Impaction A cerumen impaction is when the wax in your ear forms a plug. This plug usually causes reduced hearing. Sometimes it also causes an earache or dizziness. Removing a cerumen impaction can be difficult and painful. The wax sticks to the ear canal. The canal is sensitive and bleeds easily. If you try to remove a heavy wax buildup with a cotton tipped swab, you may push it in further. Irrigation with water, suction, and small ear curettes may be used to clear out the wax. If the impaction is fixed to the skin in the ear canal, ear drops may be needed for a few days to loosen the wax. People who build up a lot of wax frequently can use ear wax removal products available in your local drugstore. SEEK MEDICAL CARE IF:  You develop an earache, increased hearing loss, or marked dizziness. Document Released: 06/11/2004 Document Revised: 07/27/2011 Document Reviewed: 08/01/2009 [facility] Patient Information 2015 [place], [place]. This information is not intended to replace advice given to you by your health care provider. Make sure you discuss any questions you have with your health care provider.    Sialadenitis Sialadenitis is an inflammation (soreness) of the salivary glands. The parotid is the main salivary gland. It lies behind the angle of the jaw below the ear. The saliva produced comes out of a tiny opening (duct) inside the cheek on either side. This is usually at the level of the upper back teeth. If it is swollen, the ear is pushed up and out. This helps tell this condition apart from a simple lymph gland infection (swollen glands) in the same area. Mumps has mostly disappeared since the start of immunization against mumps. Now the most common cause of parotitis is germ (bacterial) infection or inflammation of the lymphatics (the lymph channels). The other major salivary gland is located in the floor of the mouth. Smaller salivary glands are located in the mouth. This includes  the:  Lips.  Lining of the mouth.  Pharynx.  Hard palate (front part of the roof of the mouth). The salivary glands do many things, including:  Lubrication.  Breaking down food.  Production of hormones and antibodies (to protect against germs which may cause illness).  Help with the sense of taste. ACUTE BACTERIAL SIALADENITIS This is a sudden inflammatory response to bacterial infection. This causes redness, pain, swelling and tenderness over the infected gland. In the past, it was common in dehydrated and debilitated patients often following an operation. It is now more commonly seen:  After radiotherapy.  In patients with poor immune systems. Treatment is:  The correction of fluid balance (rehydration).  Medicine that kill germs (antibiotics).  Pain relief. CHRONIC RECURRENT SIALADENITIS This refers to repeated episodes of discomfort and swelling of one of the salivary glands. It often occurs after eating. Chronic sialadenitis is usually less painful. It is associated with recurrent enlargement of a salivary gland, often following meals, and typically with an absence of redness. The chronic form of the disease often is associated with conditions linked to decreased salivary flow, rather than dehydration (loss of body fluids). These conditions include:  A stone, or concretion, formed in the gallbladder, kidneys, or other parts of the body (calculi).  Salivary stasis.  A change in the fluid and electrolyte (the salts in your body fluids) makeup of the gland. It is treated with:  Gland massage.  Methods to stimulate the flow of saliva, (for example, lemon juice).  Antibiotics if required. Surgery to remove the  gland is possible, but its benefits need to be balanced against risks.  VIRAL SIALADENITIS Several viruses infect the salivary glands. Some of these include the mumps virus that commonly infects the parotid gland. Other viruses causing problems are:  The HIV  virus.  Herpes.  Some of the influenza ("flu") viruses. RECURRENT SIALADENITIS IN CHILDREN This condition is thought to be due to swelling or ballooning of the ducts. It results in the same symptoms as acute bacterial parotitis. It is usually caused by germs (bacteria). It is often treated using penicillin. It may get well without treatment. Surgery is usually not required. TUBERCULOUS SIALADENITIS The salivary glands may become infected with the same bacteria causing tuberculosis ("TB"). Treatment is with anti-tuberculous antibiotic therapy. OTHER UNCOMMON CAUSES OF SIALADENITIS   Sjogren's syndrome is a condition in which arthritis is associated with a decrease in activity of the glands of the body that produce saliva and tears. The diagnosis is made with blood tests or by examination of a piece of tissue from the inside of the lip. Some people with this condition are bothered by:  A dry mouth.  Intermittent salivary gland enlargement.  Atypical mycobacteria is a germ similar to tuberculosis. It often infects children. It is often resistant to antibiotic treatment. It may require surgical treatment to remove the infected salivary gland.  Actinomycosis is an infection of the parotid gland that may also involve the overlying skin. The diagnosis is made by detecting granules of sulphur produced by the bacteria on microscopic examination. Treatment is a prolonged course of penicillin for up to one year.  Nutritional causes include vitamin deficiencies and bulimia.  Diabetes and problems with your thyroid.  Obesity, cirrhosis, and malabsorption are some metabolic causes. HOME CARE INSTRUCTIONS   Apply ice bags every 2 hours for 15-20 minutes, while awake, to the sore gland for 24 hours, then as directed by your caregiver. Place the ice in a plastic bag with a towel around it to prevent frostbite to the skin.  Only take over-the-counter or prescription medicines for pain, discomfort, or  fever as directed by your caregiver. SEEK IMMEDIATE MEDICAL CARE IF:   There is increased pain or swelling in your gland that is not controlled with medicine.  An oral temperature above 102 F (38.9 C) develops, not controlled by medicine.  You develop difficulty opening your mouth, swallowing, or speaking. Document Released: 10/24/2001 Document Revised: 07/27/2011 Document Reviewed: 12/19/2007 [facility] Patient Information 2015 [place], [place]. This information is not intended to replace advice given to you by your health care provider. Make sure you discuss any questions you have with your health care provider.   Return if asymmetry/swelling persists/worsens, especially if fever, pain, but f/u with Dr. Redmond School if doesn't resolve in 3-4 weeks.

## 2013-12-04 NOTE — Progress Notes (Signed)
Chief Complaint  Patient presents with  . Cerumen Impaction    needs ear wax removal-feels like both ears are impacted. Saw Dr.Lalonde for CPE in April and thought he was going to do it then.    He reports decreased hearing in the left ear since late last week. Denies any significant pain/discomfort.  He reports having had problems with ear wax in the past, and thinks he needs them cleaned out.  Not having decreased hearing in the right, but was told at his physical that he had wax in both.  Would like them both cleaned, if needed.  Last week he noticed some swelling in his left neck.  He had been stung in the scalp by a wasp, and the whole left side of his head was swollen.  Swelling resolved in head/face, but notes persistent asymmetry in his neck.  He is a former smoker.  Denies URI symptoms--had a cold a few weeks ago.  Past Medical History  Diagnosis Date  . Renal stone   . CAD (coronary artery disease) 2008  . Dyslipidemia   . Hyperlipidemia   . Panic attacks   . Family history of heart disease   . Hypertension   . Statin intolerance    Past Surgical History  Procedure Laterality Date  . Colonoscopy  2010    Dr. Ardis Hughs  . Biceps tendon repair Left   . Cardiac catheterization  06/19/2008    normal left main, Cfx with 3 OMs (50% osital narrowing in OM1), LAD densely calcified in the proximal half of vessel with eccentric narrowing in mid region (40-50% severity), diag 1 & 2 free of disease, RCA with less than 30% narrowing in midportion (Dr. Rockne Menghini)   History   Social History  . Marital Status: Widowed    Spouse Name: N/A    Number of Children: N/A  . Years of Education: N/A   Occupational History  . pipe fitter    Social History Main Topics  . Smoking status: Former Smoker    Types: Cigarettes    Quit date: 11/02/1983  . Smokeless tobacco: Former Systems developer  . Alcohol Use: 1.8 oz/week    3 Cans of beer per week  . Drug Use: No  . Sexual Activity: Not Currently   Other  Topics Concern  . Not on file   Social History Narrative  . No narrative on file   Outpatient Encounter Prescriptions as of 12/04/2013  Medication Sig  . aspirin 81 MG tablet Take 160 mg by mouth daily.  . cholecalciferol (VITAMIN D) 1000 UNITS tablet Take 1,000 Units by mouth daily.  . fenofibrate 160 MG tablet Take 1 tablet (160 mg total) by mouth daily.  Marland Kitchen GARLIC PO Take 1 capsule by mouth daily.  . metoprolol tartrate (LOPRESSOR) 25 MG tablet TAKE 1/2 TABLET (12.5mg ) BY MOUTH TWICE DAILY  . Multiple Vitamins-Minerals (MULTIVITAMIN WITH MINERALS) tablet Take 1 tablet by mouth daily.  . Omega-3 Fatty Acids (FISH OIL) 1000 MG CAPS Take 1,000 mg by mouth 2 (two) times daily.  Marland Kitchen ALPRAZolam (XANAX) 0.5 MG tablet Take 1 tablet (0.5 mg total) by mouth at bedtime as needed.   Allergies  Allergen Reactions  . Bystolic [Nebivolol Hcl]   . Lipitor [Atorvastatin]   . Lisinopril   . Metoprolol   . Pravastatin   . Red Yeast Rice [Cholestin]   . Sulfonamide Derivatives     REACTION: hives  . Zocor [Simvastatin]    ROS:  Denies fevers, chills, current  URI symptoms. No ear pain.  +decreased hearing left ear. No nausea, vomiting, chest pain, shortness of breath or other complaints.  PHYSICAL EXAM: BP 148/78  Pulse 72  Ht 5\' 6"  (1.676 m)  Wt 152 lb (68.947 kg)  BMI 24.55 kg/m2 well developed, pleasant male in no distress HEENT:  PERRL, EOMI, conjunctiva clear.  R TM is occluded with soft appearing wax.  The left is completely occluded, with deeper, harder/darker wax. TM's and EAC's completely normal after ear lavage, and hearing returned to normal OP clear, no erythema or lesions Neck:  There is prominence (asymmetry) of left submandibular gland.  Nontender.  No lymphadenopathy.  ASSESSMENT/PLAN:  Cerumen impaction, bilateral  Hearing loss due to cerumen impaction, left  Submandibular gland swelling - left   Cerumen impaction, with decreased hearing on the left--resolved with ear  lavage  Reviewed Ddx of swollen submandibular exam. Lemon drops Return if asymmetry/swelling persists/worsens, especially if fever, pain, but f/u with Dr. Redmond School if doesn't resolve in 3-4 weeks.

## 2013-12-21 ENCOUNTER — Encounter: Payer: Self-pay | Admitting: Family Medicine

## 2013-12-21 ENCOUNTER — Ambulatory Visit (INDEPENDENT_AMBULATORY_CARE_PROVIDER_SITE_OTHER): Payer: Medicare Other | Admitting: Family Medicine

## 2013-12-21 VITALS — BP 120/76 | HR 87 | Wt 151.0 lb

## 2013-12-21 DIAGNOSIS — K118 Other diseases of salivary glands: Secondary | ICD-10-CM

## 2013-12-21 NOTE — Patient Instructions (Signed)
Use lemon drops regularly through the weekend and if no better by Tuesday call

## 2013-12-21 NOTE — Progress Notes (Signed)
   Subjective:    Patient ID: Matthew Moreno, male    DOB: 11-22-1945, 68 y.o.   MRN: [id number]  HPI Here for recheck concerning swelling in the left submandibular area. He did try lemon on one occasion and noted no change. He cannot relate the swelling to food but does state that the swelling does seem to be less in the morning. He has noted on occasion a stringy sensation of his saliva. He also does note occasional hoarseness.   Review of Systems     Objective:   Physical Exam Alert and in no distress. TMs normal. 2-3 cm round smooth movable lesion is noted in the submandibular area. Exam of the mouth including palpation of the lower jaw and tongue was normal.       Assessment & Plan:  Salivary gland obstruction  recommend he use lemon drops regularly through the weekend to see if this will help. If no improvement, CT or ultrasound will be ordered. At the end of the encounter he states he noted an orange  color to his ejaculate with some slight urinary discomfort. He has had no recent sexual encounters. Recommended followup on this if he has another episode of the same.

## 2013-12-27 ENCOUNTER — Telehealth: Payer: Self-pay | Admitting: Family Medicine

## 2013-12-27 ENCOUNTER — Other Ambulatory Visit: Payer: Self-pay

## 2013-12-27 DIAGNOSIS — K118 Other diseases of salivary glands: Secondary | ICD-10-CM

## 2013-12-27 NOTE — Telephone Encounter (Signed)
Order a CT is scan of neck soft tissue to the submandibular mass.

## 2013-12-27 NOTE — Telephone Encounter (Signed)
I HAVE PUT ORDER IN ALONG WITH AUTH # [id number]

## 2013-12-29 ENCOUNTER — Ambulatory Visit
Admission: RE | Admit: 2013-12-29 | Discharge: 2013-12-29 | Disposition: A | Discharge status: Home / Self Care | Payer: Medicare Other | Source: Ambulatory Visit | Attending: Family Medicine | Admitting: Family Medicine

## 2013-12-29 DIAGNOSIS — K118 Other diseases of salivary glands: Secondary | ICD-10-CM

## 2013-12-29 MED ORDER — IOHEXOL 300 MG/ML  SOLN
75.0000 mL | Freq: Once | INTRAMUSCULAR | Status: AC | PRN
  Administered 2013-12-29: 75 mL via INTRAVENOUS

## 2014-01-05 ENCOUNTER — Other Ambulatory Visit (HOSPITAL_COMMUNITY)
Admission: RE | Admit: 2014-01-05 | Discharge: 2014-01-05 | Disposition: A | Discharge status: Home / Self Care | Payer: Medicare Other | Source: Ambulatory Visit | Attending: Otolaryngology | Admitting: Otolaryngology

## 2014-01-05 ENCOUNTER — Other Ambulatory Visit: Payer: Self-pay | Admitting: Otolaryngology

## 2014-01-05 DIAGNOSIS — R229 Localized swelling, mass and lump, unspecified: Secondary | ICD-10-CM | POA: Diagnosis present

## 2014-01-05 DIAGNOSIS — C099 Malignant neoplasm of tonsil, unspecified: Secondary | ICD-10-CM

## 2014-01-05 HISTORY — DX: Malignant neoplasm of tonsil, unspecified: C09.9

## 2014-01-12 ENCOUNTER — Other Ambulatory Visit (HOSPITAL_COMMUNITY): Payer: Self-pay | Admitting: Otolaryngology

## 2014-01-12 DIAGNOSIS — C099 Malignant neoplasm of tonsil, unspecified: Secondary | ICD-10-CM

## 2014-01-16 ENCOUNTER — Ambulatory Visit (HOSPITAL_COMMUNITY)
Admission: RE | Admit: 2014-01-16 | Discharge: 2014-01-16 | Disposition: A | Discharge status: Home / Self Care | Payer: Medicare Other | Source: Ambulatory Visit | Attending: Diagnostic Radiology | Admitting: Diagnostic Radiology

## 2014-01-16 DIAGNOSIS — C099 Malignant neoplasm of tonsil, unspecified: Secondary | ICD-10-CM | POA: Diagnosis present

## 2014-01-16 DIAGNOSIS — R599 Enlarged lymph nodes, unspecified: Secondary | ICD-10-CM | POA: Diagnosis not present

## 2014-01-16 LAB — GLUCOSE, CAPILLARY: Glucose-Capillary: 107 mg/dL — ABNORMAL HIGH (ref 70–99)

## 2014-01-16 MED ORDER — FLUDEOXYGLUCOSE F - 18 (FDG) INJECTION
7.5200 | Freq: Once | INTRAVENOUS | Status: AC | PRN
  Administered 2014-01-16: 7.52 via INTRAVENOUS

## 2014-01-17 ENCOUNTER — Telehealth: Payer: Self-pay | Admitting: Hematology and Oncology

## 2014-01-17 NOTE — Telephone Encounter (Signed)
S/W PATIENT AND GAVE NP APPT FOR 09/09 @ 1:30 W/DR. Wilmore.

## 2014-01-23 ENCOUNTER — Telehealth: Payer: Self-pay | Admitting: *Deleted

## 2014-01-23 ENCOUNTER — Encounter: Payer: Self-pay | Admitting: Radiation Oncology

## 2014-01-23 DIAGNOSIS — C099 Malignant neoplasm of tonsil, unspecified: Secondary | ICD-10-CM | POA: Insufficient documentation

## 2014-01-23 NOTE — Telephone Encounter (Signed)
Called pt to introduce myself as the oncology nurse navigator that works with Dr. Isidore Moos and to let him know that I be joining him tomorrow when he sees her.  LVM on home and mobile phone with request that he return my call.  Gayleen Orem, RN, BSN, [place] [facility] [place] [phone]

## 2014-01-23 NOTE — Telephone Encounter (Signed)
Patient returned my call.  I explained that I will be his navigator while he is receiving tmts at [facility], explained my role as a member of his Care Team.  I indicated I will be joining him tomorrow during his appts with Drs. Isidore Moos and [place].  I explained arrival/registration procedure; he verbalized understanding.  Initiating navigation as L1 patient (new patient) with this encounter.  Gayleen Orem, RN, BSN, [place] [facility] [place] [phone]

## 2014-01-23 NOTE — Progress Notes (Signed)
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  He originally thought it was due to a wasp sting on his scalp.  Biopsies revealed:   01/05/14 Diagnosis LYMPH NODE, FINE NEEDLE ASPIRATION, LEFT NECK (SPECIMEN 1 OF 1 COLLECTED 01-05-2014) HISTIOCYTES AND SQUAMOUS CELLS, PLEASE SEE COMMENT. Comment There are abundant histiocytes and occational squamous cells in this material. The findings are suggestive of cystic content. The material is not diagnostic for malignancy. Clinical and radiographic correlation is recommended. Tissue examination is recommended if clinically so indicated.  01/05/14 Diagnosis Tonsil, biopsy, left - SQUAMOUS CELL CARCINOMA.  Nutrition Status:  Weight changes: 16 lb weight gain since retiring in 2009  Swallowing status: no difficulties  Plans, if any, for PEG tube: no  Tobacco/Marijuana/Snuff/ETOH use: former smoker , 1 pack every 1 1 /2 days, no  smokeless tabaco, drinks 3 cans of beer per every few days  Past/Anticipated interventions by otolaryngology, if any: 01/05/14 - fine needle aspirate of the left neck mass and biopsy of the left tonsil by Dr. Lucia Gaskins  Past/Anticipated interventions by medical oncology, if any: has a new patient consultation appointment with Dr. Alvy Bimler on 9/9  Referrals yet, to any of the following?  Social Work? no  Dentistry? No, patient has full upper denture, lower partial  Swallowing therapy? no  Nutrition? no  Med/Onc? Dr. Alvy Bimler new consultation on 01/24/14  PEG placement? no  SAFETY ISSUES:  Prior radiation? no  Pacemaker/ICD? no  Possible current pregnancy? na  Is the patient on methotrexate? no  Current Complaints / other details:  Denies pain, difficulty eating, chewing, swallowing, loss of appetite

## 2014-01-23 NOTE — Progress Notes (Signed)
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 male who had a 26monthhistory of left neck swelling. No dysphagia, sore throat, weight loss, or other associated symptoms. CT of neck on 12-29-13 showed L level II mass and possible L tonsil lesion.  Biopsy by Dr NLucia Gaskinson 8-21 revealed  The tumor cells are strongly and diffusely positive for p16 with appropriate control. NAL DIAGNOSIS Diagnosis Tonsil, biopsy, left - SQUAMOUS CELL CARCINOMA.  Imaging and path were reviewed at tumor board.  As described above, CT of neck on 12-29-13 showed L level II mass and possible L tonsil lesion. PET scan confirms SUV uptake in the L Level II mass, and although not noted in the report, there is asymmetric hypermetabolic activity in the L > R tonsil.  He quit smoking in 1985, after smoking about 0.75ppd x 20 years. No chewing tobacco. Drinks approximately 1-2 beers a day.  Has dentures/partials, and baseline, chronic issues swallowing steak.   PREVIOUS RADIATION THERAPY: No  PAST MEDICAL HISTORY:  has a past medical history of Renal stone; CAD (coronary artery disease) (2008); Dyslipidemia; Hyperlipidemia; Panic attacks; Family history of heart disease; Hypertension; Statin intolerance; Tonsil cancer; and Cancer (01/05/14).   cataracts  PAST SURGICAL HISTORY: Past Surgical History  Procedure Laterality Date  . Colonoscopy  2010    Dr. JArdis Hughs . Biceps tendon repair Left   . Cardiac catheterization  06/19/2008    normal left main, Cfx with 3 OMs (50% osital narrowing in OM1), LAD densely calcified in the proximal half of vessel with eccentric  narrowing in mid region (40-50% severity), diag 1 & 2 free of disease, RCA with less than 30% narrowing in midportion (Dr. ARockne Menghini    FAMILY HISTORY: family history includes Heart Problems in his father and mother.  SOCIAL HISTORY:  reports that he quit smoking about 30 years ago. His smoking use included Cigarettes. He has a 10 pack-year smoking history. He has never used smokeless tobacco. He reports that he drinks about 1.8 ounces of alcohol per week. He reports that he does not use illicit drugs.  ALLERGIES: Bystolic; Lipitor; Lisinopril; Metoprolol; Pravastatin; Red yeast rice; Sulfonamide derivatives; and Zocor  MEDICATIONS:  Current Outpatient Prescriptions  Medication Sig Dispense Refill  . ALPRAZolam (XANAX) 0.5 MG tablet Take 1 tablet (0.5 mg total) by mouth at bedtime as needed.  30 tablet  4  . aspirin 81 MG tablet Take 160 mg by mouth daily.      . cholecalciferol (VITAMIN D) 1000 UNITS tablet Take 1,000 Units by mouth daily.      . fenofibrate 160 MG tablet Take 1 tablet (160 mg total) by mouth daily.  30 tablet  6  . GARLIC PO Take 1 capsule by mouth daily.      . metoprolol tartrate (LOPRESSOR) 25 MG tablet TAKE 1/2 TABLET (12.532m BY MOUTH TWICE DAILY  90 tablet  1  . Multiple Vitamins-Minerals (MULTIVITAMIN WITH MINERALS) tablet Take 1 tablet by mouth daily.      . Omega-3 Fatty Acids (FISH OIL) 1000 MG CAPS Take 1,000 mg by mouth 2 (two) times daily.  No current facility-administered medications for this encounter.   Facility-Administered Medications Ordered in Other Encounters  Medication Dose Route Frequency Provider Last Rate Last Dose  . influenza  inactive virus vaccine (FLUZONE/FLUARIX) injection 0.5 mL  0.5 mL Intramuscular Once Denita Lung, MD        REVIEW OF SYSTEMS:  Notable for that above.   PHYSICAL EXAM:  weight is 153 lb 9.6 oz (69.673 kg). His oral temperature is 98.6 F (37 C). His blood pressure is 171/86 and his pulse is 76. His  respiration is 20.   General: Alert and oriented, in no acute distress HEENT: Head is normocephalic. Pupils are equally round and reactive to light. Extraocular movements are intact. Oropharynx is clear. Upper dentures, lower partials removed for exam Neck: + left level II cervical  Lymphadenopathy - mass is approximately 3 cm Extremities: No cyanosis or edema. Skin: no ulceration of tumor through skin of neck Musculoskeletal: ambulatory Neurologic: Cranial nerves II through XII are grossly intact. No obvious focalities. Speech is fluent.  Psychiatric: Judgment and insight are intact. Affect is appropriate.   ECOG = 0  0 - Asymptomatic (Fully active, able to carry on all predisease activities without restriction)  1 - Symptomatic but completely ambulatory (Restricted in physically strenuous activity but ambulatory and able to carry out work of a light or sedentary nature. For example, light housework, office work)  2 - Symptomatic, <50% in bed during the day (Ambulatory and capable of all self care but unable to carry out any work activities. Up and about more than 50% of waking hours)  3 - Symptomatic, >50% in bed, but not bedbound (Capable of only limited self-care, confined to bed or chair 50% or more of waking hours)  4 - Bedbound (Completely disabled. Cannot carry on any self-care. Totally confined to bed or chair)  5 - Death   Eustace Pen MM, Creech RH, Tormey DC, et al. [phone]). "Toxicity and response criteria of the [facility]". [place] Oncol. 5 (6): 649-55   LABORATORY DATA:  Lab Results  Component Value Date   WBC 5.5 09/07/2013   HGB 15.7 09/07/2013   HCT 45.0 09/07/2013   MCV 94.7 09/07/2013   PLT 240 09/07/2013   CMP     Component Value Date/Time   NA 138 09/07/2013 1032   K 4.1 09/07/2013 1032   CL 101 09/07/2013 1032   CO2 26 09/07/2013 1032   GLUCOSE 95 09/07/2013 1032   BUN 14 09/07/2013 1032   CREATININE 0.88 09/07/2013 1032   CREATININE 0.9  03/29/2009 0912   CALCIUM 9.0 09/07/2013 1032   PROT 7.4 09/07/2013 1032   ALBUMIN 4.1 09/07/2013 1032   AST 28 09/07/2013 1032   ALT 32 09/07/2013 1032   ALKPHOS 63 09/07/2013 1032   BILITOT 0.6 09/07/2013 1032   GFRNONAA 90.61 03/29/2009 0912   GFRAA  Value: >60        The eGFR has been calculated using the MDRD equation. This calculation has not been validated in all clinical situations. eGFR's persistently <60 mL/min signify possible Chronic Kidney Disease. 06/19/2008 0335         RADIOGRAPHY: Ct Soft Tissue Neck W Contrast  12/29/2013   CLINICAL DATA:  Left submandibular mass.  BUN and creatinine were obtained on site at [place] at [address]Results: BUN 15 mg/dL, Creatinine 1.1 mg/dL.  EXAM: CT NECK WITH CONTRAST  TECHNIQUE: Multidetector CT imaging of the neck was performed using the standard  protocol following the bolus administration of intravenous contrast.  CONTRAST:  47m OMNIPAQUE IOHEXOL 300 MG/ML  SOLN  COMPARISON:  None.  FINDINGS: Lung apices are clear. No superior mediastinal lesion other than atherosclerosis of the aorta. Visualized intracranial contents are unremarkable. Visualized sinuses, middle ears and mastoids are clear.  Both parotid glands are normal. Both submandibular glands are normal. The thyroid gland is normal.  The palpable mass represents centrally necrotic level 2 lymphadenopathy on the left measuring 2.8 x 2.3 x 4.6 cm. There is no other enlarged or abnormal lymph node on either side of the neck. Small supraclavicular nodes bilaterally are within normal limits.  One could question slight asymmetry of the left tonsil, but definite mass is not established by CT. Tissue at the base of the tongue is slightly prominent. This could be normal lymphoid tissue, but this area should be evaluated as well. No laryngeal mass. Ordinary degenerative changes affect spine, with the bridging osteophyte anteriorly at C3-4 were and facet arthropathy above and below that.   Vascular structures are patent, with atherosclerotic disease at both carotid bifurcations with ICA narrowing of 30-50% bilaterally.  IMPRESSION: Necrotic level 2 lymph node on the left measuring 2.8 x 2.3 x 4.6 cm. Highly likely to be involved by metastatic carcinoma. Definite primary is not identified, but there is some suspicion of the left tonsil and tongue base.   Electronically Signed   By: MNelson ChimesM.D.   On: 12/29/2013 11:39   Nm Pet Image Initial (pi) Skull Base To Thigh  01/16/2014   CLINICAL DATA:  Initial treatment strategy for left tonsillar carcinoma.  EXAM: NUCLEAR MEDICINE PET SKULL BASE TO THIGH  TECHNIQUE: 7.5 mCi F-18 FDG was injected intravenously. Full-ring PET imaging was performed from the skull base to thigh after the radiotracer. CT data was obtained and used for attenuation correction and anatomic localization.  FASTING BLOOD GLUCOSE:  Value: 107 mg/dl  COMPARISON:  Neck CT on 12/29/2013  FINDINGS: NECK  Mild left sided level 2 lymphadenopathy shows hypermetabolic activity, with SUV max of 4.0. No other hypermetabolic masses or lymphadenopathy identified within the neck.  CHEST  No hypermetabolic mediastinal or hilar nodes. No suspicious pulmonary nodules on the CT scan.  ABDOMEN/PELVIS  No abnormal hypermetabolic activity within the liver, pancreas, adrenal glands, or spleen. No hypermetabolic lymph nodes in the abdomen or pelvis.  SKELETON  No focal hypermetabolic activity to suggest skeletal metastasis.  IMPRESSION: Hypermetabolic left-sided level 2 cervical lymphadenopathy.  No other hypermetabolic masses or lymphadenopathy within the neck. No evidence of distant metastatic disease.   Electronically Signed   By: JEarle GellM.D.   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 He is an excellent candidate for  radiotherapy. Plan is as below:   1)  Today he will see med/onc to discuss chemotherapy   2) Referral to dentistry for dental evaluation/extractions if needed   3) Will refer to social work for social support   4) Will refer to nutrition for nutrition support   5) Medical Oncology may eventually refer to surgery for PEG tube placement. This is depending on chemotherapy plans, if any.   6) Will refer to swallowing therapy for dysphagia prevention   7) Simulation once cleared by dentistry. Anticipate 7 weeks of RT - 70 Gy in 35 fractions.   8) PT referral for pre-RT  assessment / neck measurements due to risk of lymphedema in neck; may benefit from PT for this after completion of radiotherapy   9) Baseline TSH ordered  It was a pleasure meeting the patient today. We discussed the risks, benefits, and side effects of radiotherapy. We talked in detail about acute and late effects. He understands that some of the most bothersome acute effects will be significant soreness of the mouth and throat, changes in taste, changes in salivary function, skin irritation, hair loss, dehydration, weight loss and fatigue. We talked about late effects which include but are not necessarily limited to dysphagia, hypothyroidism, nerve injury, spinal cord injury, dry mouth, trismus, and neck edema. No guarantees of treatment were given. A consent form was signed and placed in the patient's medical record. The patient is enthusiastic about proceeding with treatment. I look forward to participating in the patient's care.  __________________________________________   Eppie Gibson, MD

## 2014-01-24 ENCOUNTER — Ambulatory Visit (HOSPITAL_BASED_OUTPATIENT_CLINIC_OR_DEPARTMENT_OTHER): Payer: Medicare Other | Admitting: Hematology and Oncology

## 2014-01-24 ENCOUNTER — Encounter: Payer: Self-pay | Admitting: Hematology and Oncology

## 2014-01-24 ENCOUNTER — Encounter: Payer: Self-pay | Admitting: *Deleted

## 2014-01-24 ENCOUNTER — Ambulatory Visit: Payer: Medicare Other

## 2014-01-24 ENCOUNTER — Ambulatory Visit
Admission: RE | Admit: 2014-01-24 | Discharge: 2014-01-24 | Disposition: A | Discharge status: Home / Self Care | Payer: Medicare Other | Source: Ambulatory Visit | Attending: Radiation Oncology | Admitting: Radiation Oncology

## 2014-01-24 ENCOUNTER — Telehealth: Payer: Self-pay | Admitting: *Deleted

## 2014-01-24 ENCOUNTER — Encounter: Payer: Self-pay | Admitting: Radiation Oncology

## 2014-01-24 VITALS — BP 155/72 | HR 69 | Temp 98.4°F | Resp 18 | Ht 66.0 in | Wt 154.2 lb

## 2014-01-24 VITALS — BP 171/86 | HR 76 | Temp 98.6°F | Resp 20 | Wt 153.6 lb

## 2014-01-24 DIAGNOSIS — Z7982 Long term (current) use of aspirin: Secondary | ICD-10-CM | POA: Diagnosis not present

## 2014-01-24 DIAGNOSIS — I2510 Atherosclerotic heart disease of native coronary artery without angina pectoris: Secondary | ICD-10-CM | POA: Insufficient documentation

## 2014-01-24 DIAGNOSIS — I10 Essential (primary) hypertension: Secondary | ICD-10-CM | POA: Diagnosis not present

## 2014-01-24 DIAGNOSIS — Z510 Encounter for antineoplastic radiation therapy: Secondary | ICD-10-CM | POA: Insufficient documentation

## 2014-01-24 DIAGNOSIS — Z87891 Personal history of nicotine dependence: Secondary | ICD-10-CM | POA: Insufficient documentation

## 2014-01-24 DIAGNOSIS — C099 Malignant neoplasm of tonsil, unspecified: Secondary | ICD-10-CM | POA: Diagnosis not present

## 2014-01-24 HISTORY — DX: Malignant neoplasm of tonsil, unspecified: C09.9

## 2014-01-24 NOTE — Addendum Note (Signed)
Encounter addended by: Andria Rhein, RN on: 01/24/2014  9:55 AM<BR>     Documentation filed: Charges VN, Visit Diagnoses

## 2014-01-24 NOTE — Progress Notes (Signed)
Checked in new pt with no financial concerns at this time.  I informed pt of the different foundations that offer copay assistance for chemo if needed.  I gave pt my card for any questions or concerns.

## 2014-01-24 NOTE — Progress Notes (Signed)
Please see the Nurse Progress Note in the MD Initial Consult Encounter for this patient. 

## 2014-01-24 NOTE — Progress Notes (Signed)
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 male is here because of newly diagnosed tonsillar cancer According to the patient, the first initial presentation was due to palpable left neck mass and slight sore throat.  he denies any hearing deficit, difficulties with chewing food, swallowing difficulties, painful swallowing, changes in the quality of his voice or abnormal weight loss. I reviewed his records extensively and summarized as follows: Oncology History   Tonsil cancer, HPV positive   Primary site: Pharynx - Oropharynx   Staging method: AJCC 7th Edition   Clinical: Stage IVA (T1, N2a, M0) signed by Eppie Gibson, MD on 01/23/2014 12:05 PM   Summary: Stage IVA (T1, N2a, M0)       Tonsil cancer   12/29/2013 Imaging CT scan of the neck showed necrotic level 2 lymph node on the left measuring 2.8 x 2.3 x 4.6 cm with asymmetry in the left tonsil   01/05/2014 Procedure Fine needle aspirate of the neck lymph node is suspicious for malignancy.   01/05/2014 Surgery Accession: [id number] left tonsil biopsy is positive for squamous cell carcinoma, HPV positive   01/16/2014 Imaging PET CT scan showed hypermetabolic left-sided level 2 cervical lymphadenopathy. No other hypermetabolic masses or lymphadenopathy within the neck    MEDICAL HISTORY:  Past Medical History  Diagnosis Date  . Renal stone   . CAD (coronary artery disease) 2008  . Dyslipidemia   . Hyperlipidemia   . Panic attacks   . Family history of heart disease   . Hypertension   . Statin intolerance   . Tonsil cancer      left  . Cancer 01/05/14    left tonsil    SURGICAL HISTORY: Past Surgical History  Procedure Laterality Date  . Colonoscopy  2010    Dr. Ardis Hughs  . Biceps tendon repair Left   . Cardiac catheterization  06/19/2008    normal left main, Cfx with 3 OMs (50% osital narrowing in OM1), LAD densely calcified in the proximal half of vessel with eccentric narrowing in mid region (40-50% severity), diag 1 & 2 free of disease, RCA with less than 30% narrowing in midportion (Dr. Rockne Menghini)    SOCIAL HISTORY: History   Social History  . Marital Status: Widowed    Spouse Name: N/A    Number of Children: N/A  . Years of Education: N/A   Occupational History  . pipe fitter    Social History Main Topics  . Smoking status: Former Smoker -- 0.50 packs/day for 20 years    Types: Cigarettes    Quit date: 11/02/1983  . Smokeless tobacco: Never Used  . Alcohol Use: 1.8 oz/week    3 Cans of beer per week     Comment: 01/24/14 currently 3-4 beers a few days a week  . Drug Use: No  . Sexual Activity: Not Currently   Other Topics Concern  . Not on file   Social History Narrative  . No narrative on file    FAMILY HISTORY: Family History  Problem Relation Age of Onset  . Heart Problems Mother   . Heart Problems Father  ALLERGIES:  is allergic to bystolic; lipitor; lisinopril; metoprolol; pravastatin; red yeast rice; sulfonamide derivatives; and zocor.  MEDICATIONS:  Current Outpatient Prescriptions  Medication Sig Dispense Refill  . ALPRAZolam (XANAX) 0.5 MG tablet Take 1 tablet (0.5 mg total) by mouth at bedtime as needed.  30 tablet  4  . aspirin 81 MG tablet Take 160 mg by mouth daily.      . cholecalciferol (VITAMIN D) 1000 UNITS tablet Take 1,000 Units by mouth daily.      . fenofibrate 160 MG tablet Take 1 tablet (160 mg total) by mouth daily.  30 tablet  6  . GARLIC PO Take 1 capsule by mouth daily.      . metoprolol tartrate (LOPRESSOR) 25 MG tablet TAKE 1/2 TABLET (12.5mg ) BY  MOUTH TWICE DAILY  90 tablet  1  . Multiple Vitamins-Minerals (MULTIVITAMIN WITH MINERALS) tablet Take 1 tablet by mouth daily.      . Omega-3 Fatty Acids (FISH OIL) 1000 MG CAPS Take 1,000 mg by mouth 2 (two) times daily.       No current facility-administered medications for this visit.   Facility-Administered Medications Ordered in Other Visits  Medication Dose Route Frequency Provider Last Rate Last Dose  . influenza  inactive virus vaccine (FLUZONE/FLUARIX) injection 0.5 mL  0.5 mL Intramuscular Once Denita Lung, MD        REVIEW OF SYSTEMS:   Constitutional: Denies fevers, chills or abnormal night sweats Eyes: Denies blurriness of vision, double vision or watery eyes Respiratory: Denies cough, dyspnea or wheezes Cardiovascular: Denies palpitation, chest discomfort or lower extremity swelling Gastrointestinal:  Denies nausea, heartburn or change in bowel habits Skin: Denies abnormal skin rashes Lymphatics: Denies new lymphadenopathy or easy bruising Neurological:Denies numbness, tingling or new weaknesses Behavioral/Psych: Mood is stable, no new changes  All other systems were reviewed with the patient and are negative.  PHYSICAL EXAMINATION: ECOG PERFORMANCE STATUS: 0 - Asymptomatic  Filed Vitals:   01/24/14 1332  BP: 155/72  Pulse: 69  Temp: 98.4 F (36.9 C)  Resp: 18   Filed Weights   01/24/14 1332  Weight: 154 lb 3.2 oz (69.945 kg)    GENERAL:alert, no distress and comfortable SKIN: skin color, texture, turgor are normal, no rashes or significant lesions EYES: normal, conjunctiva are pink and non-injected, sclera clear OROPHARYNX:no exudate, no erythema and lips, buccal mucosa, and tongue normal  NECK: supple, thyroid normal size, non-tender, without nodularity LYMPH:  Palpable lymphadenopathy on the left side of the neck.  LUNGS: clear to auscultation and percussion with normal breathing effort HEART: regular rate & rhythm and no murmurs and no lower  extremity edema ABDOMEN:abdomen soft, non-tender and normal bowel sounds Musculoskeletal:no cyanosis of digits and no clubbing  PSYCH: alert & oriented x 3 with fluent speech NEURO: no focal motor/sensory deficits  LABORATORY DATA:  I have reviewed the data as listed Lab Results  Component Value Date   WBC 5.5 09/07/2013   HGB 15.7 09/07/2013   HCT 45.0 09/07/2013   MCV 94.7 09/07/2013   PLT 240 09/07/2013   Lab Results  Component Value Date   NA 138 09/07/2013   K 4.1 09/07/2013   CL 101 09/07/2013   CO2 26 09/07/2013    RADIOGRAPHIC STUDIES: I reviewed the imaging with him and his daughter I have personally reviewed the radiological images as listed and agreed with the findings in the report. Ct Soft Tissue Neck W Contrast  12/29/2013   CLINICAL DATA:  Left submandibular mass.  BUN and creatinine were obtained on site at [place] at [address]Results: BUN 15 mg/dL, Creatinine 1.1 mg/dL.  EXAM: CT NECK WITH CONTRAST  TECHNIQUE: Multidetector CT imaging of the neck was performed using the standard protocol following the bolus administration of intravenous contrast.  CONTRAST:  33mL OMNIPAQUE IOHEXOL 300 MG/ML  SOLN  COMPARISON:  None.  FINDINGS: Lung apices are clear. No superior mediastinal lesion other than atherosclerosis of the aorta. Visualized intracranial contents are unremarkable. Visualized sinuses, middle ears and mastoids are clear.  Both parotid glands are normal. Both submandibular glands are normal. The thyroid gland is normal.  The palpable mass represents centrally necrotic level 2 lymphadenopathy on the left measuring 2.8 x 2.3 x 4.6 cm. There is no other enlarged or abnormal lymph node on either side of the neck. Small supraclavicular nodes bilaterally are within normal limits.  One could question slight asymmetry of the left tonsil, but definite mass is not established by CT. Tissue at the base of the tongue is slightly prominent. This could be normal lymphoid  tissue, but this area should be evaluated as well. No laryngeal mass. Ordinary degenerative changes affect spine, with the bridging osteophyte anteriorly at C3-4 were and facet arthropathy above and below that.  Vascular structures are patent, with atherosclerotic disease at both carotid bifurcations with ICA narrowing of 30-50% bilaterally.  IMPRESSION: Necrotic level 2 lymph node on the left measuring 2.8 x 2.3 x 4.6 cm. Highly likely to be involved by metastatic carcinoma. Definite primary is not identified, but there is some suspicion of the left tonsil and tongue base.   Electronically Signed   By: Nelson Chimes M.D.   On: 12/29/2013 11:39   Nm Pet Image Initial (pi) Skull Base To Thigh  01/16/2014   CLINICAL DATA:  Initial treatment strategy for left tonsillar carcinoma.  EXAM: NUCLEAR MEDICINE PET SKULL BASE TO THIGH  TECHNIQUE: 7.5 mCi F-18 FDG was injected intravenously. Full-ring PET imaging was performed from the skull base to thigh after the radiotracer. CT data was obtained and used for attenuation correction and anatomic localization.  FASTING BLOOD GLUCOSE:  Value: 107 mg/dl  COMPARISON:  Neck CT on 12/29/2013  FINDINGS: NECK  Mild left sided level 2 lymphadenopathy shows hypermetabolic activity, with SUV max of 4.0. No other hypermetabolic masses or lymphadenopathy identified within the neck.  CHEST  No hypermetabolic mediastinal or hilar nodes. No suspicious pulmonary nodules on the CT scan.  ABDOMEN/PELVIS  No abnormal hypermetabolic activity within the liver, pancreas, adrenal glands, or spleen. No hypermetabolic lymph nodes in the abdomen or pelvis.  SKELETON  No focal hypermetabolic activity to suggest skeletal metastasis.  IMPRESSION: Hypermetabolic left-sided level 2 cervical lymphadenopathy.  No other hypermetabolic masses or lymphadenopathy within the neck. No evidence of distant metastatic disease.   Electronically Signed   By: Earle Gell M.D.   On: 01/16/2014 16:10    ASSESSMENT:   Newly diagnosed squamous cell carcinoma of the Head & Neck, HPV Positive  PLAN:  Tonsil cancer I reviewed the most recent national guidelines regarding the approach for the current stage of his disease. The patient is uncomfortable with the prospect of needing port placement, feeding tube placement and potential life-threatening side effects of chemotherapy. He wants to go home and think about it.    All questions were answered. The patient knows to call the clinic with any problems, questions or concerns. I spent 40 minutes counseling the patient face to face. The total time spent  in the appointment was 60 minutes and more than 50% was on counseling.     [facility], [place], MD 01/24/2014 9:50 PM

## 2014-01-24 NOTE — Assessment & Plan Note (Signed)
I reviewed the most recent national guidelines regarding the approach for the current stage of his disease. The patient is uncomfortable with the prospect of needing port placement, feeding tube placement and potential life-threatening side effects of chemotherapy. He wants to go home and think about it.

## 2014-01-24 NOTE — Progress Notes (Signed)
To provide support and encouragement, and care continuity, met with patient and his dtr during New Consult appt with Dr. Alvy Bimler: 1. I supported Dr. Calton Dach discussion of including chemotherapy as part of his Care Plan.  Patient expressed uncertainty about proceeding with chemotherapy, will contact me tomorrow about his decision. 2. Reinforced importance of stopping ETOH consumption to improve tmt outcome. 3. Explained that Dr. Isidore Moos is also placing a referral of baseline lymphedema evaluation, explained purpose. They expressed understanding of information provided.  Gayleen Orem, RN, BSN, [facility] [place] [phone]

## 2014-01-24 NOTE — Progress Notes (Addendum)
Met with patient and his dtr during initial consult with Dr. Isidore Moos. 1. Introduced myself as their Navigator, explained my role as a member of the Care Team, provided contact information, encouraged them to contact me with questions/concerns as treatments/procedures begin. 2. Provided New Patient Information packet:  Contact information for physician and navigator  Advance Directive information ([employer] blue pamphlet)  Fall Prevention Patient Safety Plan  [facility] campus map with highlight of [place] 3. Provided introductory explanation of radiation treatment including SIM planning and fitting of head mask, showed them example of mask.   4. Provided photos/diagrams of feeding tube and PAC, explained their purpose. 5. In recognition that he lives alone and does not have family in close proximity, offered to provide Mentor as he begins treatments.  He expressed interest. 6. Provided a tour of SIM and Tomo areas, explained treatment and arrival procedures.   They verbalized understanding of information provided.    Gayleen Orem, RN, BSN, [place] at [place] [phone]

## 2014-01-24 NOTE — Telephone Encounter (Signed)
Called patient to inform of dental appt, PT appt. Nutrition appt. Speech therapy and lab, spoke with patient and he is aware of these appts.

## 2014-01-25 ENCOUNTER — Encounter (HOSPITAL_COMMUNITY): Payer: Self-pay | Admitting: Dentistry

## 2014-01-25 ENCOUNTER — Telehealth: Payer: Self-pay | Admitting: *Deleted

## 2014-01-25 ENCOUNTER — Ambulatory Visit (HOSPITAL_COMMUNITY): Payer: Self-pay | Admitting: Dentistry

## 2014-01-25 VITALS — BP 135/66 | HR 72 | Temp 98.1°F

## 2014-01-25 DIAGNOSIS — C099 Malignant neoplasm of tonsil, unspecified: Secondary | ICD-10-CM

## 2014-01-25 DIAGNOSIS — IMO0002 Reserved for concepts with insufficient information to code with codable children: Secondary | ICD-10-CM

## 2014-01-25 DIAGNOSIS — K08109 Complete loss of teeth, unspecified cause, unspecified class: Secondary | ICD-10-CM

## 2014-01-25 DIAGNOSIS — K036 Deposits [accretions] on teeth: Secondary | ICD-10-CM

## 2014-01-25 DIAGNOSIS — Z0189 Encounter for other specified special examinations: Secondary | ICD-10-CM

## 2014-01-25 DIAGNOSIS — K0530 Chronic periodontitis, unspecified: Secondary | ICD-10-CM

## 2014-01-25 DIAGNOSIS — M264 Malocclusion, unspecified: Secondary | ICD-10-CM

## 2014-01-25 DIAGNOSIS — K011 Impacted teeth: Secondary | ICD-10-CM

## 2014-01-25 MED ORDER — SODIUM FLUORIDE 1.1 % DT GEL: DENTAL | Status: DC

## 2014-01-25 NOTE — Progress Notes (Signed)
DENTAL CONSULTATION  Date of Consultation:  01/25/2014 Patient Name:   Matthew Moreno Date of Birth:   [birth year] Medical Record Number: [id number]  VITALS: BP 135/66  Pulse 72  Temp(Src) 98.1 F (36.7 C) (Oral)   CHIEF COMPLAINT: The patient was referred by Dr. Isidore Moos for a pre-radiation therapy dental protocol examination.  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 Patient with anticipated radiation therapy and possible chemotherapy. Patient now seen as part of a medically necessary pre-chemoradiation therapy dental protocol examination.  Patient currently denies having any toothache, swellings, or abscesses. Patient was last seen by the Dentist one year ago for an exam and cleaning. Patient sees Dr. Ashok Pall for his dental care. Patient is seen on an annual basis.  Patient has an upper complete denture and lower cast partial denture that were fabricated 8-9 years ago by Dr. Rolla Etienne. Patient indicates that the upper denture and lower partial denture "fit fine".  PROBLEM LIST: Patient Active Problem List   Diagnosis Date Noted  . Tonsil cancer 01/23/2014  . Statin intolerance 07/13/2012  . Hypertriglyceridemia 06/23/2011  . CAD (coronary artery disease) 06/23/2011  . Former smoker 06/23/2011  . DYSPEPSIA&OTHER [facility] DISORDERS FUNCTION STOMACH 03/29/2009    PMH: Past Medical History  Diagnosis Date  . Nephrolithiasis     H/O multiple "kidney stones", Lithotripsies, J-Stents  . CAD (coronary artery disease) 2008  . Dyslipidemia   . Panic attacks   . Family history of heart disease   . Hypertension   . Statin intolerance   . Tonsil cancer 01/05/14    left   PSH: Past Surgical History  Procedure Laterality Date  . Colonoscopy  2010    Dr. Ardis Hughs  . Biceps tendon repair Left   . Cardiac catheterization  06/19/2008    normal left main, Cfx with 3 OMs (50% osital narrowing in OM1), LAD densely  calcified in the proximal half of vessel with eccentric narrowing in mid region (40-50% severity), diag 1 & 2 free of disease, RCA with less than 30% narrowing in midportion (Dr. Rockne Menghini)    ALLERGIES: Allergies  Allergen Reactions  . Bystolic [Nebivolol Hcl]   . Lipitor [Atorvastatin] Other (See Comments)    Muscle aches with statins  . Lisinopril   . Metoprolol   . Pravastatin   . Red Yeast Rice [Cholestin]   . Sulfonamide Derivatives     REACTION: hives  . Zocor [Simvastatin]     MEDICATIONS: Current Outpatient Prescriptions  Medication Sig Dispense Refill  . ALPRAZolam (XANAX) 0.5 MG tablet Take 1 tablet (0.5 mg total) by mouth at bedtime as needed.  30 tablet  4  . aspirin 81 MG tablet Take 160 mg by mouth daily.      . cholecalciferol (VITAMIN D) 1000 UNITS tablet Take 1,000 Units by mouth daily.      . fenofibrate 160 MG tablet Take 1 tablet (160 mg total) by mouth daily.  30 tablet  6  . GARLIC PO Take 1 capsule by mouth daily.      . metoprolol tartrate (LOPRESSOR) 25 MG tablet TAKE 1/2 TABLET (12.22m) BY MOUTH TWICE DAILY  90 tablet  1  . Multiple Vitamins-Minerals (MULTIVITAMIN WITH MINERALS) tablet Take 1 tablet by mouth daily.      . Omega-3 Fatty Acids (FISH OIL) 1000 MG CAPS Take 1,000 mg by mouth 2 (two) times daily.       No current facility-administered  medications for this visit.   Facility-Administered Medications Ordered in Other Visits  Medication Dose Route Frequency Provider Last Rate Last Dose  . influenza  inactive virus vaccine (FLUZONE/FLUARIX) injection 0.5 mL  0.5 mL Intramuscular Once Denita Lung, MD        LABS: Lab Results  Component Value Date   WBC 5.5 09/07/2013   HGB 15.7 09/07/2013   HCT 45.0 09/07/2013   MCV 94.7 09/07/2013   PLT 240 09/07/2013      Component Value Date/Time   NA 138 09/07/2013 1032   K 4.1 09/07/2013 1032   CL 101 09/07/2013 1032   CO2 26 09/07/2013 1032   GLUCOSE 95 09/07/2013 1032   BUN 14 09/07/2013 1032    CREATININE 0.88 09/07/2013 1032   CREATININE 0.9 03/29/2009 0912   CALCIUM 9.0 09/07/2013 1032   GFRNONAA 90.61 03/29/2009 0912   GFRAA  Value: >60        The eGFR has been calculated using the MDRD equation. This calculation has not been validated in all clinical situations. eGFR's persistently <60 mL/min signify possible Chronic Kidney Disease. 06/19/2008 0335   Lab Results  Component Value Date   INR 1.0 06/18/2008   No results found for this basename: PTT    SOCIAL HISTORY: History   Social History  . Marital Status: Widowed    Spouse Name: N/A    Number of Children: 2  . Years of Education: N/A   Occupational History  . pipe fitter     Retired   Social History Main Topics  . Smoking status: Former Smoker -- 0.75 packs/day for 20 years    Types: Cigarettes    Quit date: 11/02/1983  . Smokeless tobacco: Never Used  . Alcohol Use: 1.8 oz/week    3 Cans of beer per week     Comment: 01/24/14 currently 3-4 beers a few days a week  . Drug Use: No  . Sexual Activity: Not Currently   Other Topics Concern  . Not on file   Social History Narrative   Patient is widowed. He had one son and one daughter. The son passed away from Tylenol toxicity.    FAMILY HISTORY: Family History  Problem Relation Age of Onset  . Heart Problems Mother   . Heart Problems Father    REVIEW OF SYSTEMS: Reviewed with patient and included in dental record.   DENTAL HISTORY: CHIEF COMPLAINT: The patient was referred by Dr. Isidore Moos for a pre-radiation therapy dental protocol examination.  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 Patient with anticipated radiation therapy and possible chemotherapy. Patient now seen as part of a medically necessary pre-chemoradiation therapy dental protocol examination.  Patient currently denies having any toothache, swellings, or abscesses. Patient was last seen one year ago for an exam and  cleaning. Patient sees Dr. Ashok Pall for his dental care. Patient is seen on an annual basis.  Patient has an upper complete denture and lower cast partial denture that were fabricated 8-9 years ago by Dr. Rolla Etienne. Patient indicates that the upper denture and lower partial denture "fit fine".  DENTAL EXAMINATION:  GENERAL: The patient is a well-developed, well-nourished male in no acute distress. HEAD AND NECK: There is left neck lymphadenopathy. There is no right neck lymphadenopathy palpated. Patient denies acute TMJ symptoms. INTRAORAL EXAM: Patient has normal saliva. Patient has an edentulous maxilla. Patient has a mandibular right lingual torus or impacted tooth in  the lingual vestibule. DENTITION: Patient is missing tooth numbers 1 through 16, 17, 18, 19, 23, 24, 25, 26,31, and 32. There appears to be a retained primary tooth cut off at the gum line in the area of #K. There appears to be a retained T with an impacted left premolar and additional supernumerary tooth on the lower right side. PERIODONTAL: Patient has chronic periodontitis with minimal plaque accumulations, selective areas of gingival recession and incipient tooth mobility. There is incipient bone loss noted. DENTAL CARIES/SUBOPTIMAL RESTORATIONS: There are no dental caries noted. ENDODONTIC: Patient currently denies acute pulpitis symptoms. I do not see any evidence of periapical pathology. CROWN AND BRIDGE: The patient has a PFM crown on tooth #30 that is acceptable. PROSTHODONTIC: The patient has an upper complete and lower cast partial denture that are both clinically acceptable. There is acceptable retention and stability of both. OCCLUSION: The patient has a poor occlusal scheme but an acceptable and stable occlusal denture scheme.  RADIOGRAPHIC INTERPRETATION: An orthopantogram was obtained and supplemented with 6 lower periapical radiographs. There are multiple missing teeth. There is incipient to moderate bone  loss. There are impacted teeth in the lower right quadrant area of the premolars. There appears to be retained primary teeth in the area of K and T. Multiple dental restorations are noted.  ASSESSMENTS: 1. Squamous cell carcinoma left tonsil 2. Pre-chemoradiation therapy dental protocol 3. Chronic periodontitis 4. Accretions-minimal 5. Selective areas gingival recession 6. Impacted teeth 7. Retained primary teeth 8. Multiple missing teeth 9. Upper complete denture and lower cast partial denture that are to clinically acceptable 10. Poor occlusal scheme but a stable denture occlusion   PLAN/RECOMMENDATIONS: 1. I discussed the risks, benefits, and complications of various treatment options with the patient in relationship to his medical and dental conditions, anticipated radiation therapy and possible chemotherapy, and chemoradiation therapy side effects to include xerostomia, radiation caries, taste changes, gum and jawbone changes, and risk for infection, and osteoradionecrosis. We discussed various treatment options to include no treatment, multiple extractions with alveoloplasty, pre-prosthetic surgery as indicated, periodontal therapy, dental restorations, root canal therapy, crown and bridge therapy, implant therapy, and replacement of missing teeth as indicated. The patient currently wishes to defer any significant dental care at this time. The patient will followup with Dr. Ashok Pall for periodontal therapy. Patient did agree to impressions of his lower teeth for fabrication of a lower fluoride tray and scatter protection device.  A prescription for FluoriSHIELD was sent to [facility] long outpatient pharmacy with PRN refills for one year.  2. Discussion of findings with medical team and coordination of future medical and dental care as needed.  I spent 60 minutes face to face with patient and more than 50% of time was spent in counseling and /or coordination of care.   Lenn Cal, DDS

## 2014-01-25 NOTE — Patient Instructions (Signed)

## 2014-01-25 NOTE — Telephone Encounter (Signed)
Rec'd call from patient: 1)  He stated he has decided not to include chemotherapy as part of his treatment plan.  He came to this decision after further discussion with his dtr, further review of information provided by Dr. Alvy Bimler.  I informed Dr. Alvy Bimler of his decision. 2)  He reiterated his interest in having a mentor as he proceeds with RT.  I explained the intent of the program, general guidelines and emphasized that mentors have signed a Confidentiality Agreement.  He verbalized understanding. Will follow-up. Continuing navigation as L1 patient (new patient).  Gayleen Orem, RN, BSN, [place] at [place] [phone]

## 2014-01-26 ENCOUNTER — Other Ambulatory Visit: Payer: Self-pay | Admitting: Radiation Oncology

## 2014-01-26 ENCOUNTER — Telehealth: Payer: Self-pay | Admitting: *Deleted

## 2014-01-26 DIAGNOSIS — C099 Malignant neoplasm of tonsil, unspecified: Secondary | ICD-10-CM

## 2014-01-26 NOTE — Telephone Encounter (Signed)
Called patient to inform of lab appt. Being moved to 01-30-14 @ 2 pm per Dr. Isidore Moos request, spoke with patient and he is aware of this and he will have his lab done on 01-30-14 @ 2 pm

## 2014-01-29 ENCOUNTER — Telehealth: Payer: Self-pay | Admitting: *Deleted

## 2014-01-29 ENCOUNTER — Ambulatory Visit: Payer: Medicare Other | Attending: Radiation Oncology | Admitting: Physical Therapy

## 2014-01-29 DIAGNOSIS — IMO0001 Reserved for inherently not codable concepts without codable children: Secondary | ICD-10-CM | POA: Diagnosis present

## 2014-01-29 DIAGNOSIS — R293 Abnormal posture: Secondary | ICD-10-CM | POA: Diagnosis not present

## 2014-01-29 NOTE — Telephone Encounter (Signed)
Mr. Amick called to inquire if he needed to fast before tomorrow's labs.  I stated he did not.  Gayleen Orem, RN, BSN, [place] at [place] [phone]

## 2014-01-30 ENCOUNTER — Encounter (HOSPITAL_COMMUNITY): Payer: Self-pay | Admitting: Dentistry

## 2014-01-30 ENCOUNTER — Ambulatory Visit (HOSPITAL_COMMUNITY): Payer: Self-pay | Admitting: Dentistry

## 2014-01-30 ENCOUNTER — Ambulatory Visit
Admission: RE | Admit: 2014-01-30 | Discharge: 2014-01-30 | Disposition: A | Discharge status: Home / Self Care | Payer: Medicare Other | Source: Ambulatory Visit | Attending: Radiation Oncology | Admitting: Radiation Oncology

## 2014-01-30 VITALS — BP 131/73 | HR 67 | Temp 98.2°F

## 2014-01-30 DIAGNOSIS — Z463 Encounter for fitting and adjustment of dental prosthetic device: Secondary | ICD-10-CM

## 2014-01-30 DIAGNOSIS — Z0189 Encounter for other specified special examinations: Secondary | ICD-10-CM

## 2014-01-30 DIAGNOSIS — C099 Malignant neoplasm of tonsil, unspecified: Secondary | ICD-10-CM

## 2014-01-30 LAB — BASIC METABOLIC PANEL (CC13)
ANION GAP: 10 meq/L (ref 3–11)
BUN: 15.6 mg/dL (ref 7.0–26.0)
CO2: 24 meq/L (ref 22–29)
Calcium: 10.1 mg/dL (ref 8.4–10.4)
Chloride: 106 mEq/L (ref 98–109)
Creatinine: 1.1 mg/dL (ref 0.7–1.3)
GLUCOSE: 87 mg/dL (ref 70–140)
Potassium: 4.5 mEq/L (ref 3.5–5.1)
SODIUM: 140 meq/L (ref 136–145)

## 2014-01-30 LAB — CBC WITH DIFFERENTIAL/PLATELET
BASO%: 0.9 % (ref 0.0–2.0)
Basophils Absolute: 0.1 10*3/uL (ref 0.0–0.1)
EOS ABS: 0.1 10*3/uL (ref 0.0–0.5)
EOS%: 1.1 % (ref 0.0–7.0)
HEMATOCRIT: 44.4 % (ref 38.4–49.9)
HGB: 15.1 g/dL (ref 13.0–17.1)
LYMPH%: 29.2 % (ref 14.0–49.0)
MCH: 32.2 pg (ref 27.2–33.4)
MCHC: 33.9 g/dL (ref 32.0–36.0)
MCV: 95 fL (ref 79.3–98.0)
MONO#: 0.7 10*3/uL (ref 0.1–0.9)
MONO%: 10.5 % (ref 0.0–14.0)
NEUT%: 58.3 % (ref 39.0–75.0)
NEUTROS ABS: 3.6 10*3/uL (ref 1.5–6.5)
PLATELETS: 290 10*3/uL (ref 140–400)
RBC: 4.68 10*6/uL (ref 4.20–5.82)
RDW: 12.9 % (ref 11.0–14.6)
WBC: 6.2 10*3/uL (ref 4.0–10.3)
lymph#: 1.8 10*3/uL (ref 0.9–3.3)

## 2014-01-30 LAB — TSH CHCC: TSH: 0.834 m(IU)/L (ref 0.320–4.118)

## 2014-01-30 NOTE — Progress Notes (Signed)
01/30/2014  Patient:            Matthew Moreno Date of Birth:  1945-09-10 MRN:                [id number]  BP 131/73  Pulse 67  Temp(Src) 98.2 F (36.8 C)  [name] now presents for insertion of lower fluoride tray and scatter protection device. Patient had cleaning with Dr. Willaim Rayas.  PROCEDURE: Appliances were tried in and adjusted as needed. [place] Trismus device was previously fabricated. Postop instructions were provided and a written and verbal format concerning the use and care of appliances. All questions were answered. Patient to return to clinic for periodic oral examination in approximately 2-3 weeks during radiation therapy. Patient to call if questions or problems arise before then.  Lenn Cal, DDS

## 2014-01-30 NOTE — Patient Instructions (Addendum)
Brush teeth after meals and at bedtime. Use fluoride therapy at bedtime. Floss at bedtime. Use multiple sips of water as needed for dry mouth. Use salt water rinses and baking soda as needed. Consider use Biotene rinses as needed. Return to clinic in 2-3 weeks for reevaluation during radiation therapy. Call if problems arise before then. Dr. Enrique Sack  FLUORIDE TRAYS PATIENT INSTRUCTIONS    Obtain prescription from the pharmacy.  Don't be surprised if it needs to be ordered.  Be sure to let the pharmacy know when you are close to needing a new refill for them to have it ready for you without interruption of Fluoride use.  The best time to use your Fluoride is before bed time.  You must brush your teeth very well and floss before using the Fluoride in order to get the best use out of the Fluoride treatments.  Place 1 drop of Fluoride gel per tooth in the tray.  Place the tray on your lower teeth and your upper teeth.  Make sure the trays are seated all the way.  Remember, they only fit one way on your teeth.  Insert for 5 full minutes.  At the end of the 5 minutes, take the trays out.  SPIT OUT excess. .  Do NOT rinse your mouth!  Do NOT eat or drink after treatments for at least 30 minutes.  This is why the best time for your treatments is before bedtime.  Clean the inside of your Fluoride trays using COLD WATER and a toothbrush.  In order to keep your Trays from discoloring and free from odors, soak them overnight in denture cleaners such as Efferdent.  Do not use bleach or non denture products.  Store the trays in a safe dry place AWAY from any heat until your next treatment.  If anything happens to your Fluoride trays, or they don't fit as well after any dental work, please let us know as soon as possible.

## 2014-01-31 ENCOUNTER — Ambulatory Visit
Admission: RE | Admit: 2014-01-31 | Discharge: 2014-01-31 | Disposition: A | Discharge status: Home / Self Care | Payer: Medicare Other | Source: Ambulatory Visit | Attending: Radiation Oncology | Admitting: Radiation Oncology

## 2014-01-31 ENCOUNTER — Encounter: Payer: Self-pay | Admitting: *Deleted

## 2014-01-31 VITALS — BP 144/79 | HR 86 | Temp 98.9°F | Resp 20 | Ht 66.0 in | Wt 153.0 lb

## 2014-01-31 DIAGNOSIS — Z510 Encounter for antineoplastic radiation therapy: Secondary | ICD-10-CM | POA: Diagnosis not present

## 2014-01-31 DIAGNOSIS — C099 Malignant neoplasm of tonsil, unspecified: Secondary | ICD-10-CM

## 2014-01-31 MED ORDER — SODIUM CHLORIDE 0.9 % IJ SOLN
10.0000 mL | Freq: Once | INTRAMUSCULAR | Status: AC
  Administered 2014-01-31: 10 mL via INTRAVENOUS

## 2014-01-31 NOTE — Progress Notes (Addendum)
CT Simulation, IMRT treatment planning note   Outpatient  Diagnosis:    ICD-9-CM ICD-10-CM  1. Tonsil cancer 146.0 C09.9    The patient was taken to the CT simulator and laid in the supine position on the table. An Aquaplast head and shoulder mask was custom fitted to the patient's anatomy. High-resolution CT axial imaging was obtained of the head and neck with contrast. I verified that the quality of the imaging is good for treatment planning. 1 Medically Necessary Treatment Device was fabricated and supervised by me: Aquaplast mask.   Treatment planning note I plan to treat the patient with helical Tomotherapy, IMRT. I plan to treat the patient's tonsil tumor and bilateral neck nodes. I plan to treat to a total dose of 70 Gray in 35  fractions   IMRT planning Note  IMRT is an important modality to deliver adequate dose to the patient's at risk tissues while sparing the patient's normal structures, including the: esophagus, parotid tissue, mandible, brain stem, spinal cord, oral cavity, brachial plexus.  This justifies the use of IMRT in the patient's treatment.    -----------------------------------  Eppie Gibson, MD

## 2014-01-31 NOTE — Progress Notes (Signed)
Patient gave name and dob as identificatin labs on 01/29/14 BUN=15.6, CR=1.1, no c/popain or nausea, sterted IV #22 g x 1in catheter needle in left hand, x 1 attempt, excellent blood return, flushed with 36ml  normal saline,  Secured withwith opsite and taped, patient tolerated well no c/opain, spoke with Aaron Edelman, RT therapist, they can get patient now  2:10 PM

## 2014-02-01 ENCOUNTER — Ambulatory Visit: Payer: Medicare Other | Admitting: Nutrition

## 2014-02-01 NOTE — Progress Notes (Signed)
68 year old male diagnosed with tonsil cancer receiving radiation therapy.  He is a patient of Dr. Isidore Moos.  Past medical history includes renal stones, CAD, dyslipidemia, hyperlipidemia, hypertension, tobacco, and alcohol usage.  Patient reports he also uses marijuana to sleep at night.  Medications include Xanax, vitamin D, garlic, multivitamin, and omega-3 fatty acids.  Labs were reviewed.  Height: 66 inches. Weight: 153.6 pounds September 9. Usual body weight: 130-150 pounds. BMI: 24.8.  Patient denies current nutrition side effects.  Weight is at the high end of his normal range.  States he was told to eat a lot of food to gain weight before treatment.   Patient has questions about using a product called "cell food".  He would like to try it to "heal and bring oxygen" to his cells. Patient also questions use of smoking marijuana for appetite stimulant.  Nutrition diagnosis: Predicted suboptimal energy intake related to diagnosis of tonsil cancer and associated treatments as evidenced by history or presence of a condition for which research shows an increased incidence of suboptimal energy intake.  Intervention: Patient educated on consuming small amounts of food 6 times a day incorporating increased protein.  Provided fact sheets. Reviewed strategies for altering temperatures and textures of foods once radiation side effects occur. Discouraged use of antioxidants in any vitamin/mineral supplement, during radiation therapy.  Encouraged patient to also discuss with physician. Provided dietary education regarding appetite stimulation using food. Discouraged smoking in any capacity.  Encouraged patient to discuss marijuana use with M.D.  Provided samples of oral nutrition supplements.  Questions were answered.  Teach back method was used.  Contact information was provided.  Monitoring, evaluation, goals: Patient will tolerate calories and protein for minimal weight loss throughout  treatment.  Next visit: Friday, October 16, after radiation treatment.  **Disclaimer: This note was dictated with voice recognition software. Similar sounding words can inadvertently be transcribed and this note may contain transcription errors which may not have been corrected upon publication of note.**

## 2014-02-02 NOTE — Progress Notes (Signed)
Spoke with patient s/p CT SIM.  He reported procedure went well.  He denied and needs/concerns; I encouraged him to call me should that change.  Gayleen Orem, RN, BSN, [place] at [place] [phone]

## 2014-02-05 ENCOUNTER — Ambulatory Visit: Payer: Medicare Other

## 2014-02-05 DIAGNOSIS — IMO0001 Reserved for inherently not codable concepts without codable children: Secondary | ICD-10-CM | POA: Diagnosis not present

## 2014-02-06 DIAGNOSIS — Z510 Encounter for antineoplastic radiation therapy: Secondary | ICD-10-CM | POA: Diagnosis not present

## 2014-02-07 ENCOUNTER — Encounter: Payer: Self-pay | Admitting: *Deleted

## 2014-02-07 NOTE — Progress Notes (Signed)
[place] Work  Clinical Social Work was referred by Pension scheme manager for assessment of psychosocial needs.  Clinical Social Worker contacted patient by phone to offer support and assess for needs.  Matthew Moreno informed CSW he is to begin radiation treatment next week.  He reports no concerns at this time, patient stated he was currently on the pier at [employer] and enjoying his week.  CSW briefly shared CSW role and support services with patient.  CSW encouraged patient to contact CSW as needed.   [name], MSW, LCSW, OSW-C Clinical Social Worker [facility] [phone]

## 2014-02-09 DIAGNOSIS — Z510 Encounter for antineoplastic radiation therapy: Secondary | ICD-10-CM | POA: Diagnosis not present

## 2014-02-12 ENCOUNTER — Ambulatory Visit
Admission: RE | Admit: 2014-02-12 | Discharge: 2014-02-12 | Disposition: A | Discharge status: Home / Self Care | Payer: Medicare Other | Source: Ambulatory Visit | Attending: Radiation Oncology | Admitting: Radiation Oncology

## 2014-02-12 ENCOUNTER — Encounter: Payer: Self-pay | Admitting: Radiation Oncology

## 2014-02-12 ENCOUNTER — Encounter: Payer: Self-pay | Admitting: *Deleted

## 2014-02-12 VITALS — BP 141/73 | HR 92 | Temp 98.2°F

## 2014-02-12 DIAGNOSIS — C099 Malignant neoplasm of tonsil, unspecified: Secondary | ICD-10-CM

## 2014-02-12 DIAGNOSIS — Z510 Encounter for antineoplastic radiation therapy: Secondary | ICD-10-CM | POA: Diagnosis not present

## 2014-02-12 NOTE — Progress Notes (Signed)
IMRT Device Note   ICD-9-CM ICD-10-CM  1. Tonsil cancer 146.0 C09.9    9.8 delivered field widths represent one set of IMRT treatment devices. The code is [phone].  -----------------------------------  Eppie Gibson, MD

## 2014-02-12 NOTE — Progress Notes (Signed)
   Weekly Management Note:  Outpatient    ICD-9-CM ICD-10-CM  1. Tonsil cancer 146.0 C09.9    Current Dose:  2 Gy  Projected Dose: 70 Gy   Narrative:  The patient presents for routine under treatment assessment.  CBCT/MVCT images/Port film x-rays were reviewed.  The chart was checked. Doing well, no complaints  Physical Findings:    Vitals with Age-Percentiles 0/05/7492  Length   Systolic 496  Diastolic 73  Pulse 92  Respiration   Weight   BMI   VISIT REPORT   Oropharyngeal mucosa is intact with no thrush or lesions. + Upper left neck palpable cervical  lymphadenopathy. Skin intact and smooth over neck.     CBC    Component Value Date/Time   WBC 6.2 01/30/2014 1430   WBC 5.5 09/07/2013 1032   RBC 4.68 01/30/2014 1430   RBC 4.75 09/07/2013 1032   HGB 15.1 01/30/2014 1430   HGB 15.7 09/07/2013 1032   HCT 44.4 01/30/2014 1430   HCT 45.0 09/07/2013 1032   PLT 290 01/30/2014 1430   PLT 240 09/07/2013 1032   MCV 95.0 01/30/2014 1430   MCV 94.7 09/07/2013 1032   MCH 32.2 01/30/2014 1430   MCH 33.1 09/07/2013 1032   MCHC 33.9 01/30/2014 1430   MCHC 34.9 09/07/2013 1032   RDW 12.9 01/30/2014 1430   RDW 13.7 09/07/2013 1032   LYMPHSABS 1.8 01/30/2014 1430   LYMPHSABS 1.6 09/07/2013 1032   MONOABS 0.7 01/30/2014 1430   MONOABS 0.6 09/07/2013 1032   EOSABS 0.1 01/30/2014 1430   EOSABS 0.1 09/07/2013 1032   BASOSABS 0.1 01/30/2014 1430   BASOSABS 0.1 09/07/2013 1032     CMP     Component Value Date/Time   NA 140 01/30/2014 1431   NA 138 09/07/2013 1032   K 4.5 01/30/2014 1431   K 4.1 09/07/2013 1032   CL 101 09/07/2013 1032   CO2 24 01/30/2014 1431   CO2 26 09/07/2013 1032   GLUCOSE 87 01/30/2014 1431   GLUCOSE 95 09/07/2013 1032   BUN 15.6 01/30/2014 1431   BUN 14 09/07/2013 1032   CREATININE 1.1 01/30/2014 1431   CREATININE 0.88 09/07/2013 1032   CREATININE 0.9 03/29/2009 0912   CALCIUM 10.1 01/30/2014 1431   CALCIUM 9.0 09/07/2013 1032   PROT 7.4 09/07/2013 1032   ALBUMIN 4.1 09/07/2013 1032     AST 28 09/07/2013 1032   ALT 32 09/07/2013 1032   ALKPHOS 63 09/07/2013 1032   BILITOT 0.6 09/07/2013 1032   GFRNONAA 90.61 03/29/2009 0912   GFRAA  Value: >60        The eGFR has been calculated using the MDRD equation. This calculation has not been validated in all clinical situations. eGFR's persistently <60 mL/min signify possible Chronic Kidney Disease. 06/19/2008 0335   Lab Results  Component Value Date   TSH 0.834 01/30/2014     Impression:  The patient is tolerating radiotherapy.   Plan:  Continue radiotherapy as planned.   -----------------------------------  Eppie Gibson, MD

## 2014-02-12 NOTE — Addendum Note (Signed)
Encounter addended by: Deirdre Evener, RN on: 02/12/2014  5:12 PM<BR>     Documentation filed: Vitals Section

## 2014-02-13 ENCOUNTER — Ambulatory Visit
Admission: RE | Admit: 2014-02-13 | Discharge: 2014-02-13 | Disposition: A | Discharge status: Home / Self Care | Payer: Medicare Other | Source: Ambulatory Visit | Attending: Radiation Oncology | Admitting: Radiation Oncology

## 2014-02-13 ENCOUNTER — Encounter: Payer: Self-pay | Admitting: *Deleted

## 2014-02-13 DIAGNOSIS — Z510 Encounter for antineoplastic radiation therapy: Secondary | ICD-10-CM | POA: Diagnosis not present

## 2014-02-13 NOTE — Progress Notes (Signed)
To provide support and encouragement, care continuity and to assess for needs, met with patient after his 2nd Tomo RT. 1. He reported that he was less anxious today. Took 1/2 Xanax prior to tmt, same as yesterday. 2. In f/u to his expressed interest in having a mentor, I provided him [employer] name, explained that he could expect a call from him in the next day or two.  I explained that [facility] understands the confidential nature of this relationship. 3. I provided him an Epic appt calendar with upcoming appts. 4. He did not express any needs or concerns at this time, I encouraged him to contact me if that changes before I see him next, he verbalized agreement.  Gayleen Orem, RN, BSN, [place] at [place] [phone]

## 2014-02-13 NOTE — Progress Notes (Signed)
To provide support and encouragement, met with patient for his New Start on Tomo.  He expressed having anxiety prior to the procedure, indicated he had taken 1/2 0.5 mg Xanax tablet prior to arriving at [facility].   Therapists provided extra encouragement during procedure.  He commented "it wasn't too bad" after tmt but he thought he might take a full 0.5 mg Xanax before tomorrow's tmt.  I assured him that it was fine to take the medication but that after several days of tmt he may discover he no longer requires it.  He verbalized appreciation for everyone's support.  Gayleen Orem, RN, BSN, [place] at [place] [phone]

## 2014-02-14 ENCOUNTER — Ambulatory Visit
Admission: RE | Admit: 2014-02-14 | Discharge: 2014-02-14 | Disposition: A | Discharge status: Home / Self Care | Payer: Medicare Other | Source: Ambulatory Visit | Attending: Radiation Oncology | Admitting: Radiation Oncology

## 2014-02-14 DIAGNOSIS — Z510 Encounter for antineoplastic radiation therapy: Secondary | ICD-10-CM | POA: Diagnosis not present

## 2014-02-15 ENCOUNTER — Ambulatory Visit
Admission: RE | Admit: 2014-02-15 | Discharge: 2014-02-15 | Disposition: A | Discharge status: Home / Self Care | Payer: Medicare Other | Source: Ambulatory Visit | Attending: Radiation Oncology | Admitting: Radiation Oncology

## 2014-02-15 DIAGNOSIS — F419 Anxiety disorder, unspecified: Secondary | ICD-10-CM | POA: Insufficient documentation

## 2014-02-15 DIAGNOSIS — Z510 Encounter for antineoplastic radiation therapy: Secondary | ICD-10-CM | POA: Diagnosis present

## 2014-02-15 DIAGNOSIS — C099 Malignant neoplasm of tonsil, unspecified: Secondary | ICD-10-CM | POA: Insufficient documentation

## 2014-02-16 ENCOUNTER — Ambulatory Visit
Admission: RE | Admit: 2014-02-16 | Discharge: 2014-02-16 | Disposition: A | Discharge status: Home / Self Care | Payer: Medicare Other | Source: Ambulatory Visit | Attending: Radiation Oncology | Admitting: Radiation Oncology

## 2014-02-16 DIAGNOSIS — Z510 Encounter for antineoplastic radiation therapy: Secondary | ICD-10-CM | POA: Diagnosis not present

## 2014-02-19 ENCOUNTER — Ambulatory Visit
Admission: RE | Admit: 2014-02-19 | Discharge: 2014-02-19 | Disposition: A | Discharge status: Home / Self Care | Payer: Medicare Other | Source: Ambulatory Visit | Attending: Radiation Oncology | Admitting: Radiation Oncology

## 2014-02-19 VITALS — BP 151/77 | HR 79 | Temp 98.1°F | Wt 154.1 lb

## 2014-02-19 DIAGNOSIS — C099 Malignant neoplasm of tonsil, unspecified: Secondary | ICD-10-CM

## 2014-02-19 DIAGNOSIS — Z510 Encounter for antineoplastic radiation therapy: Secondary | ICD-10-CM | POA: Diagnosis not present

## 2014-02-19 MED ORDER — BIAFINE EX EMUL
CUTANEOUS | Status: DC | PRN
  Administered 2014-02-19: 13:00:00 via TOPICAL

## 2014-02-19 MED ORDER — LORAZEPAM 0.5 MG PO TABS: ORAL_TABLET | ORAL | Status: DC

## 2014-02-19 NOTE — Progress Notes (Signed)
Weekly assessment.Teaching completed.given biafine.Needs script for anxiety prior to radiation.

## 2014-02-19 NOTE — Progress Notes (Signed)
CC: Dr. Eppie Gibson   Weekly Management Note:  Site: Left tonsil/neck Current Dose:  1200  cGy Projected Dose: 7000  cGy  Narrative: The patient is seen today for routine under treatment assessment. CBCT/MVCT images/port films were reviewed. The chart was reviewed.   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 He also reports slight left throat discomfort.  Physical Examination:  Filed Vitals:   02/19/14 1311  BP: 151/77  Pulse: 79  Temp: 98.1 F (36.7 C)  .  Weight: 154 lb 1.6 oz (69.899 kg). Nodes: There is palpable adenopathy along left level II region. On inspection the oropharynx there is slight erythema along the left oropharynx but no obvious tumoritis/mucositis.  Impression: Tolerating radiation therapy well. He does have moderate anxiety and he is unable to feel his existing prescription for Xanax. I will give him prescription for lorazepam 0.5 mg to take 1-2 tabs sublingually 30 minutes prior to each radiation therapy treatment. He'll see Dr. Isidore Moos later this week.  Plan: Continue radiation therapy as planned.

## 2014-02-20 ENCOUNTER — Ambulatory Visit
Admission: RE | Admit: 2014-02-20 | Discharge: 2014-02-20 | Disposition: A | Discharge status: Home / Self Care | Payer: Medicare Other | Source: Ambulatory Visit | Attending: Radiation Oncology | Admitting: Radiation Oncology

## 2014-02-20 DIAGNOSIS — Z510 Encounter for antineoplastic radiation therapy: Secondary | ICD-10-CM | POA: Diagnosis not present

## 2014-02-21 ENCOUNTER — Ambulatory Visit
Admission: RE | Admit: 2014-02-21 | Discharge: 2014-02-21 | Disposition: A | Discharge status: Home / Self Care | Payer: Medicare Other | Source: Ambulatory Visit | Attending: Radiation Oncology | Admitting: Radiation Oncology

## 2014-02-21 DIAGNOSIS — Z510 Encounter for antineoplastic radiation therapy: Secondary | ICD-10-CM | POA: Diagnosis not present

## 2014-02-22 ENCOUNTER — Encounter: Payer: Self-pay | Admitting: *Deleted

## 2014-02-22 ENCOUNTER — Ambulatory Visit
Admission: RE | Admit: 2014-02-22 | Discharge: 2014-02-22 | Disposition: A | Discharge status: Home / Self Care | Payer: Medicare Other | Source: Ambulatory Visit | Attending: Radiation Oncology | Admitting: Radiation Oncology

## 2014-02-22 DIAGNOSIS — Z510 Encounter for antineoplastic radiation therapy: Secondary | ICD-10-CM | POA: Diagnosis not present

## 2014-02-23 ENCOUNTER — Encounter: Payer: Self-pay | Admitting: Radiation Oncology

## 2014-02-23 ENCOUNTER — Ambulatory Visit
Admission: RE | Admit: 2014-02-23 | Discharge: 2014-02-23 | Disposition: A | Discharge status: Home / Self Care | Payer: Medicare Other | Source: Ambulatory Visit | Attending: Radiation Oncology | Admitting: Radiation Oncology

## 2014-02-23 ENCOUNTER — Inpatient Hospital Stay
Admission: RE | Admit: 2014-02-23 | Discharge: 2014-02-23 | Disposition: A | Discharge status: Home / Self Care | Payer: Medicare Other | Source: Ambulatory Visit | Attending: Radiation Oncology | Admitting: Radiation Oncology

## 2014-02-23 VITALS — BP 138/67 | HR 79 | Temp 98.1°F | Ht 66.0 in | Wt 152.6 lb

## 2014-02-23 DIAGNOSIS — Z510 Encounter for antineoplastic radiation therapy: Secondary | ICD-10-CM | POA: Diagnosis not present

## 2014-02-23 DIAGNOSIS — C099 Malignant neoplasm of tonsil, unspecified: Secondary | ICD-10-CM

## 2014-02-23 MED ORDER — LIDOCAINE VISCOUS 2 % MT SOLN: OROMUCOSAL | Status: DC

## 2014-02-23 NOTE — Progress Notes (Signed)
   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  CBCT/MVCT images/Port film x-rays were reviewed.  The chart was checked. Soreness developing in throat  Physical Findings:  height is $RemoveB'5\' 6"'CqNhSTus$  (1.676 m) and weight is 152 lb 9.6 oz (69.219 kg). His temperature is 98.1 F (36.7 C). His blood pressure is 138/67 and his pulse is 79.  NAD, Oropharyngeal mucosa is intact with no thrush or lesions. + palpable cervical L level II lymphadenopathy. Skin intact and smooth over neck.     CBC    Component Value Date/Time   WBC 6.2 01/30/2014 1430   WBC 5.5 09/07/2013 1032   RBC 4.68 01/30/2014 1430   RBC 4.75 09/07/2013 1032   HGB 15.1 01/30/2014 1430   HGB 15.7 09/07/2013 1032   HCT 44.4 01/30/2014 1430   HCT 45.0 09/07/2013 1032   PLT 290 01/30/2014 1430   PLT 240 09/07/2013 1032   MCV 95.0 01/30/2014 1430   MCV 94.7 09/07/2013 1032   MCH 32.2 01/30/2014 1430   MCH 33.1 09/07/2013 1032   MCHC 33.9 01/30/2014 1430   MCHC 34.9 09/07/2013 1032   RDW 12.9 01/30/2014 1430   RDW 13.7 09/07/2013 1032   LYMPHSABS 1.8 01/30/2014 1430   LYMPHSABS 1.6 09/07/2013 1032   MONOABS 0.7 01/30/2014 1430   MONOABS 0.6 09/07/2013 1032   EOSABS 0.1 01/30/2014 1430   EOSABS 0.1 09/07/2013 1032   BASOSABS 0.1 01/30/2014 1430   BASOSABS 0.1 09/07/2013 1032     CMP     Component Value Date/Time   NA 140 01/30/2014 1431   NA 138 09/07/2013 1032   K 4.5 01/30/2014 1431   K 4.1 09/07/2013 1032   CL 101 09/07/2013 1032   CO2 24 01/30/2014 1431   CO2 26 09/07/2013 1032   GLUCOSE 87 01/30/2014 1431   GLUCOSE 95 09/07/2013 1032   BUN 15.6 01/30/2014 1431   BUN 14 09/07/2013 1032   CREATININE 1.1 01/30/2014 1431   CREATININE 0.88 09/07/2013 1032   CREATININE 0.9 03/29/2009 0912   CALCIUM 10.1 01/30/2014 1431   CALCIUM 9.0 09/07/2013 1032   PROT 7.4 09/07/2013 1032   ALBUMIN 4.1 09/07/2013 1032   AST 28 09/07/2013 1032   ALT 32 09/07/2013 1032   ALKPHOS 63 09/07/2013 1032   BILITOT 0.6 09/07/2013 1032   GFRNONAA 90.61 03/29/2009 0912   GFRAA  Value: >60        The eGFR has been calculated using the MDRD equation. This calculation has not been validated in all clinical situations. eGFR's persistently <60 mL/min signify possible Chronic Kidney Disease. 06/19/2008 0335     Impression:  The patient is tolerating radiotherapy.   Plan:  Continue radiotherapy as planned. Lidocaine mouthwash Rx given.  -----------------------------------  Eppie Gibson, MD

## 2014-02-25 NOTE — Progress Notes (Signed)
To provide support and encouragement, care continuity and to assess for needs, met with patient after his Tomo tmt.  He reported: 1. tmts are going well, no issues or concerns at this point (he has completed 2 wks of radiotherapy). 2. he received call from mentor Vivia Ewing last week, they have talked again, plan to meet for lunch. Subjectively, patient was upbeat and expressed positive attitude. He did not express any needs or concerns at this time, I encouraged him to contact me if that changes before I see him next, he verbalized understanding.  Gayleen Orem, RN, BSN, [place] at [place] [phone]

## 2014-02-26 ENCOUNTER — Inpatient Hospital Stay
Admission: RE | Admit: 2014-02-26 | Discharge: 2014-02-26 | Disposition: A | Discharge status: Home / Self Care | Payer: Self-pay | Source: Ambulatory Visit | Attending: Radiation Oncology | Admitting: Radiation Oncology

## 2014-02-26 ENCOUNTER — Ambulatory Visit
Admission: RE | Admit: 2014-02-26 | Discharge: 2014-02-26 | Disposition: A | Discharge status: Home / Self Care | Payer: Medicare Other | Source: Ambulatory Visit | Attending: Radiation Oncology | Admitting: Radiation Oncology

## 2014-02-26 VITALS — BP 127/71 | HR 79 | Temp 98.2°F | Ht 66.0 in | Wt 152.6 lb

## 2014-02-26 DIAGNOSIS — Z510 Encounter for antineoplastic radiation therapy: Secondary | ICD-10-CM | POA: Diagnosis not present

## 2014-02-26 DIAGNOSIS — C099 Malignant neoplasm of tonsil, unspecified: Secondary | ICD-10-CM

## 2014-02-26 MED ORDER — HYDROCODONE-ACETAMINOPHEN 7.5-325 MG/15ML PO SOLN: 10.0000 mL | Freq: Four times a day (QID) | ORAL | Status: DC | PRN

## 2014-02-26 NOTE — Progress Notes (Signed)
Matthew Moreno has received 11 fractions to his tonsillar region.  He reports pain at a level 4/10 when he is swallowing and at this time he has not nedded to change to softer foods.  Note raised, red area in the left upper cheek which he states is sore.

## 2014-02-26 NOTE — Progress Notes (Signed)
Weekly Management Note:  Site: Left tonsil/neck Current Dose:  2200  cGy Projected Dose: 7000  cGy  Narrative: The patient is seen today for routine under treatment assessment. CBCT/MVCT images/port films were reviewed. The chart was reviewed.   He does report throat discomfort which is reasonably well controlled with viscous Xylocaine. He does not have any pain medication other than Xylocaine.  Physical Examination:  Filed Vitals:   02/26/14 1304  BP: 127/71  Pulse: 79  Temp: 98.2 F (36.8 C)  .  Weight:  . There is tumoritis along the left tonsil and upper left neck adenopathy. No candidiasis  Impression: Tolerating radiation therapy well. I suspect that he will need hydrocodone elixir within the next week and I will go ahead and get him a prescription.  Plan: Continue radiation therapy as planned.

## 2014-02-27 ENCOUNTER — Ambulatory Visit
Admission: RE | Admit: 2014-02-27 | Discharge: 2014-02-27 | Disposition: A | Discharge status: Home / Self Care | Payer: Medicare Other | Source: Ambulatory Visit | Attending: Radiation Oncology | Admitting: Radiation Oncology

## 2014-02-27 ENCOUNTER — Ambulatory Visit (HOSPITAL_COMMUNITY): Payer: Self-pay | Admitting: Dentistry

## 2014-02-27 ENCOUNTER — Encounter (HOSPITAL_COMMUNITY): Payer: Self-pay | Admitting: Dentistry

## 2014-02-27 VITALS — BP 123/68 | HR 75 | Temp 97.6°F | Wt 151.0 lb

## 2014-02-27 DIAGNOSIS — R1310 Dysphagia, unspecified: Secondary | ICD-10-CM

## 2014-02-27 DIAGNOSIS — Z0189 Encounter for other specified special examinations: Secondary | ICD-10-CM

## 2014-02-27 DIAGNOSIS — R432 Parageusia: Secondary | ICD-10-CM

## 2014-02-27 DIAGNOSIS — K117 Disturbances of salivary secretion: Secondary | ICD-10-CM

## 2014-02-27 DIAGNOSIS — C099 Malignant neoplasm of tonsil, unspecified: Secondary | ICD-10-CM

## 2014-02-27 DIAGNOSIS — R682 Dry mouth, unspecified: Principal | ICD-10-CM

## 2014-02-27 DIAGNOSIS — Z08 Encounter for follow-up examination after completed treatment for malignant neoplasm: Secondary | ICD-10-CM

## 2014-02-27 DIAGNOSIS — Z510 Encounter for antineoplastic radiation therapy: Secondary | ICD-10-CM | POA: Diagnosis not present

## 2014-02-27 NOTE — Progress Notes (Signed)
02/27/2014  Patient Name:   Matthew Moreno Date of Birth:   04/21/1946 Medical Record Number: [id number]  BP 123/68  Pulse 75  Temp(Src) 97.6 F (36.4 C) (Oral)  Wt 151 lb (68.493 kg)  [name] presents for oral examination during radiation therapy. Patient has completed 12/35 radiation treatments. No chemotherapy.  REVIEW OF CHIEF COMPLAINTS: DRY MOUTH: Yes, slightly dry.  HARD TO SWALLOW: Yes, at times.  HURT TO SWALLOW: Yes, at times. TASTE CHANGES: Taste is changing, but still has taste. SORES IN MOUTH: Yes, lower left cheek. TRISMUS: No problems with trismus. WEIGHT: 151 pounds  HOME OH REGIMEN:  BRUSHING: 3 times a day FLOSSING: 1 time a day RINSING: Using salt water and baking soda rinses and Biotene rinses. FLUORIDE: Using fluoride at bedtime. TRISMUS EXERCISES:  Maximum interincisal opening: 50 mm. Patient is doing trismus exercises without problems.   DENTAL EXAM:  Oral Hygiene:(PLAQUE): Good oral hygiene LOCATION OF MUCOSITIS: Left buccal mucosa. DESCRIPTION OF SALIVA: Decreased, foamy saliva. ANY EXPOSED BONE: None noted OTHER WATCHED AREAS: Remaining teeth. DX: Xerostomia, Dysgeusia, Dysphagia, Odynophagia, Weight Loss and Mucositis  RECOMMENDATIONS: 1. Brush after meals and at bedtime.  Use fluoride at bedtime. 2. Use trismus exercises as directed. 3. Use Biotene Rinse or salt water/baking soda rinses. 4. Multiple sips of water as needed. 5. Return to clinic in two months for oral exam after radiation therapy. Call if problems before then.  Lenn Cal, DDS

## 2014-02-27 NOTE — Patient Instructions (Signed)
RECOMMENDATIONS: 1. Brush after meals and at bedtime.  Use fluoride at bedtime. 2. Use trismus exercises as directed. 3. Use Biotene Rinse or salt water/baking soda rinses. 4. Multiple sips of water as needed. 5. Return to clinic in two months for oral exam after radiation therapy. Call if problems before then.  Ronald F. Kulinski, DDS   

## 2014-02-28 ENCOUNTER — Other Ambulatory Visit (INDEPENDENT_AMBULATORY_CARE_PROVIDER_SITE_OTHER): Payer: Medicare Other

## 2014-02-28 ENCOUNTER — Ambulatory Visit (HOSPITAL_COMMUNITY): Payer: Self-pay | Admitting: Dentistry

## 2014-02-28 ENCOUNTER — Ambulatory Visit
Admission: RE | Admit: 2014-02-28 | Discharge: 2014-02-28 | Disposition: A | Discharge status: Home / Self Care | Payer: Medicare Other | Source: Ambulatory Visit | Attending: Radiation Oncology | Admitting: Radiation Oncology

## 2014-02-28 ENCOUNTER — Encounter: Payer: Self-pay | Admitting: *Deleted

## 2014-02-28 DIAGNOSIS — Z23 Encounter for immunization: Secondary | ICD-10-CM

## 2014-02-28 DIAGNOSIS — Z510 Encounter for antineoplastic radiation therapy: Secondary | ICD-10-CM | POA: Diagnosis not present

## 2014-02-28 NOTE — Progress Notes (Signed)
Meet briefly with patient in [facility] lobby.  He reported tmts are going well, beginning to feel sore throat SE but not at level which interferes with oral intake.  He understands he can call me with concerns.  Gayleen Orem, RN, BSN, [place] at [place] [phone]

## 2014-03-01 ENCOUNTER — Ambulatory Visit
Admission: RE | Admit: 2014-03-01 | Discharge: 2014-03-01 | Disposition: A | Discharge status: Home / Self Care | Payer: Medicare Other | Source: Ambulatory Visit | Attending: Radiation Oncology | Admitting: Radiation Oncology

## 2014-03-01 DIAGNOSIS — Z510 Encounter for antineoplastic radiation therapy: Secondary | ICD-10-CM | POA: Diagnosis not present

## 2014-03-02 ENCOUNTER — Ambulatory Visit: Payer: Medicare Other | Admitting: Nutrition

## 2014-03-02 ENCOUNTER — Ambulatory Visit
Admission: RE | Admit: 2014-03-02 | Discharge: 2014-03-02 | Disposition: A | Discharge status: Home / Self Care | Payer: Medicare Other | Source: Ambulatory Visit | Attending: Radiation Oncology | Admitting: Radiation Oncology

## 2014-03-02 DIAGNOSIS — Z510 Encounter for antineoplastic radiation therapy: Secondary | ICD-10-CM | POA: Diagnosis not present

## 2014-03-02 NOTE — Progress Notes (Signed)
Nutrition followup completed with patient.  Weight decreased slightly and documented as 151 pounds October 13.  Patient reports appetite is good.  His throat is beginning to get sore.  He is drinking ensure or boost or [employer] twice a day.  Nutrition diagnosis: Predicted suboptimal energy intake has evolved into inadequate oral intake related to tonsil cancer and associated treatments as evidenced by patient's self-report of decreased intake and 2 pound weight loss.  Intervention: Patient educated to increase oral nutrition supplements 4 times a day. Educated patient on altering temperatures and textures of foods. Questions were answered.  Teach back method used.  Monitoring, evaluation, goals: Patient will increase oral intake to minimize further weight loss.  Next visit: Friday, October 23.  After radiation.   **Disclaimer: This note was dictated with voice recognition software. Similar sounding words can inadvertently be transcribed and this note may contain transcription errors which may not have been corrected upon publication of note.**

## 2014-03-05 ENCOUNTER — Ambulatory Visit
Admission: RE | Admit: 2014-03-05 | Discharge: 2014-03-05 | Disposition: A | Discharge status: Home / Self Care | Payer: Medicare Other | Source: Ambulatory Visit | Attending: Radiation Oncology | Admitting: Radiation Oncology

## 2014-03-05 ENCOUNTER — Encounter: Payer: Self-pay | Admitting: Radiation Oncology

## 2014-03-05 VITALS — BP 141/74 | HR 78 | Temp 97.5°F | Resp 14 | Wt 149.2 lb

## 2014-03-05 DIAGNOSIS — Z510 Encounter for antineoplastic radiation therapy: Secondary | ICD-10-CM | POA: Diagnosis not present

## 2014-03-05 DIAGNOSIS — C099 Malignant neoplasm of tonsil, unspecified: Secondary | ICD-10-CM

## 2014-03-05 MED ORDER — SUCRALFATE 1 G PO TABS: ORAL_TABLET | ORAL | Status: DC

## 2014-03-05 NOTE — Progress Notes (Signed)
He rates his pain as a 3 on a scale of 0-10. Pt complains of itching and sore throat, Poor Appetite, Pain-sore throat and Pain Occurs -Constantly. Reports the Xylocaine doesn't help.  Pt presenting appropriate quality, quantity and organization of sentences. Pt reports painful swallowing. PO Diet: eating foods that are soft and easy to eat, reports drinking oral supplement (boost/ensure) 3-4 cans a day. Has lost approx 2lbs since last appointment. Oral exam reveals mucous membranes moist, pharynx normal without lesions, noted a small amount of yellow exudate on tongue. Skin erythema and Dryness over neck, continues to apply Biafine BID.

## 2014-03-05 NOTE — Progress Notes (Signed)
 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  CBCT/MVCT images/Port film x-rays were reviewed.  The chart was checked. He rates his pain as a 3 on a scale of 0-10. Pt complains of itching and sore throat, Poor Appetite, Pain-sore throat and Pain Occurs -Constantly. Reports the Xylocaine doesn't help.   Pt reports painful swallowing. PO Diet: eating foods that are soft and easy to eat, reports drinking oral supplement (boost/ensure) 3-4 cans a day. Has lost approx 2lbs since last appointment. Skin erythema and Dryness over neck, continues to apply Biafine BID   Physical Findings:  weight is 149 lb 3.2 oz (67.677 kg). His oral temperature is 97.5 F (36.4 C). His blood pressure is 141/74 and his pulse is 78. His respiration is 14 and oxygen saturation is 100%.  erythema of palate.  No thrush.  Neck skin is dry.  CBC    Component Value Date/Time   WBC 6.2 01/30/2014 1430   WBC 5.5 09/07/2013 1032   RBC 4.68 01/30/2014 1430   RBC 4.75 09/07/2013 1032   HGB 15.1 01/30/2014 1430   HGB 15.7 09/07/2013 1032   HCT 44.4 01/30/2014 1430   HCT 45.0 09/07/2013 1032   PLT 290 01/30/2014 1430   PLT 240 09/07/2013 1032   MCV 95.0 01/30/2014 1430   MCV 94.7 09/07/2013 1032   MCH 32.2 01/30/2014 1430   MCH 33.1 09/07/2013 1032   MCHC 33.9 01/30/2014 1430   MCHC 34.9 09/07/2013 1032   RDW 12.9 01/30/2014 1430   RDW 13.7 09/07/2013 1032   LYMPHSABS 1.8 01/30/2014 1430   LYMPHSABS 1.6 09/07/2013 1032   MONOABS 0.7 01/30/2014 1430   MONOABS 0.6 09/07/2013 1032   EOSABS 0.1 01/30/2014 1430   EOSABS 0.1 09/07/2013 1032   BASOSABS 0.1 01/30/2014 1430   BASOSABS 0.1 09/07/2013 1032     CMP     Component Value Date/Time   NA 140 01/30/2014 1431   NA 138 09/07/2013 1032   K 4.5 01/30/2014 1431   K 4.1 09/07/2013 1032   CL 101 09/07/2013 1032   CO2 24 01/30/2014 1431   CO2 26 09/07/2013 1032   GLUCOSE 87 01/30/2014 1431   GLUCOSE 95 09/07/2013 1032   BUN 15.6 01/30/2014 1431   BUN 14 09/07/2013 1032   CREATININE 1.1 01/30/2014 1431   CREATININE 0.88 09/07/2013 1032   CREATININE 0.9 03/29/2009 0912   CALCIUM 10.1 01/30/2014 1431   CALCIUM 9.0 09/07/2013 1032   PROT 7.4 09/07/2013 1032   ALBUMIN 4.1 09/07/2013 1032   AST 28 09/07/2013 1032   ALT 32 09/07/2013 1032   ALKPHOS 63 09/07/2013 1032   BILITOT 0.6 09/07/2013 1032   GFRNONAA 90.61 03/29/2009 0912   GFRAA  Value: >60        The eGFR has been calculated using the MDRD equation. This calculation has not been validated in all clinical situations. eGFR's persistently <60 mL/min signify possible Chronic Kidney Disease. 06/19/2008 0335    Lab Results  Component Value Date   TSH 0.834 01/30/2014    Impression:  The patient is tolerating radiotherapy.   Plan:  Continue radiotherapy as planned. Start sucralfate for heartburn, odynophagia.  Hydrocodone PRN.  Push PO fluids, nutrition - eggs work well for him. Labs next week.  -----------------------------------     , MD 

## 2014-03-06 ENCOUNTER — Ambulatory Visit: Payer: Medicare Other

## 2014-03-06 ENCOUNTER — Other Ambulatory Visit: Payer: Self-pay | Admitting: Radiation Oncology

## 2014-03-06 MED ORDER — OXYCODONE HCL 5 MG/5ML PO SOLN: 5.0000 mg | ORAL | Status: DC | PRN

## 2014-03-07 ENCOUNTER — Ambulatory Visit
Admission: RE | Admit: 2014-03-07 | Discharge: 2014-03-07 | Disposition: A | Discharge status: Home / Self Care | Payer: Medicare Other | Source: Ambulatory Visit | Attending: Radiation Oncology | Admitting: Radiation Oncology

## 2014-03-07 ENCOUNTER — Other Ambulatory Visit: Payer: Self-pay | Admitting: Radiation Oncology

## 2014-03-07 DIAGNOSIS — C099 Malignant neoplasm of tonsil, unspecified: Secondary | ICD-10-CM

## 2014-03-07 DIAGNOSIS — Z510 Encounter for antineoplastic radiation therapy: Secondary | ICD-10-CM | POA: Diagnosis not present

## 2014-03-07 MED ORDER — OXYCODONE HCL 5 MG PO TABS: 5.0000 mg | ORAL_TABLET | ORAL | Status: DC | PRN

## 2014-03-08 ENCOUNTER — Ambulatory Visit
Admission: RE | Admit: 2014-03-08 | Discharge: 2014-03-08 | Disposition: A | Discharge status: Home / Self Care | Payer: Medicare Other | Source: Ambulatory Visit | Attending: Radiation Oncology | Admitting: Radiation Oncology

## 2014-03-08 DIAGNOSIS — Z510 Encounter for antineoplastic radiation therapy: Secondary | ICD-10-CM | POA: Diagnosis not present

## 2014-03-09 ENCOUNTER — Ambulatory Visit
Admission: RE | Admit: 2014-03-09 | Discharge: 2014-03-09 | Disposition: A | Discharge status: Home / Self Care | Payer: Medicare Other | Source: Ambulatory Visit | Attending: Radiation Oncology | Admitting: Radiation Oncology

## 2014-03-09 ENCOUNTER — Ambulatory Visit: Payer: Medicare Other | Admitting: Nutrition

## 2014-03-09 DIAGNOSIS — Z510 Encounter for antineoplastic radiation therapy: Secondary | ICD-10-CM | POA: Diagnosis not present

## 2014-03-09 NOTE — Progress Notes (Signed)
Nutrition followup completed with patient being treated with radiation therapy for tonsil cancer. Weight decreased slightly and documented as 148 pounds October 23, down from 149.2 pounds October 19. Patient reports appetite remains good.  He is still able to tolerate soft foods.  He is drinking at least 4 oral nutrition supplements daily.  Utilizing ensure or boost or [employer].   Patient reports heartburn after dinner but has medication.  Nutrition diagnosis: Predicted suboptimal energy intake continues.  Intervention:  Patient to continue oral nutrition supplements at least 4 times a day.   Encouraged patient to consume smaller, more frequent meals.   Questions were answered.  Teach back method used.  Monitoring, evaluation, goals: Patient will continue increased oral intake to minimize weight loss.  Next visit: Friday, November 6,  after radiation therapy.     **Disclaimer: This note was dictated with voice recognition software. Similar sounding words can inadvertently be transcribed and this note may contain transcription errors which may not have been corrected upon publication of note.**

## 2014-03-12 ENCOUNTER — Ambulatory Visit
Admission: RE | Admit: 2014-03-12 | Discharge: 2014-03-12 | Disposition: A | Discharge status: Home / Self Care | Payer: Medicare Other | Source: Ambulatory Visit | Attending: Radiation Oncology | Admitting: Radiation Oncology

## 2014-03-12 ENCOUNTER — Encounter: Payer: Self-pay | Admitting: Radiation Oncology

## 2014-03-12 VITALS — BP 132/63 | HR 75 | Temp 98.1°F | Resp 12 | Wt 146.7 lb

## 2014-03-12 DIAGNOSIS — C099 Malignant neoplasm of tonsil, unspecified: Secondary | ICD-10-CM

## 2014-03-12 DIAGNOSIS — Z510 Encounter for antineoplastic radiation therapy: Secondary | ICD-10-CM | POA: Diagnosis not present

## 2014-03-12 LAB — CBC WITH DIFFERENTIAL/PLATELET
BASO%: 0.5 % (ref 0.0–2.0)
Basophils Absolute: 0 10*3/uL (ref 0.0–0.1)
EOS%: 2.9 % (ref 0.0–7.0)
Eosinophils Absolute: 0.1 10*3/uL (ref 0.0–0.5)
HCT: 38.9 % (ref 38.4–49.9)
HEMOGLOBIN: 13.4 g/dL (ref 13.0–17.1)
LYMPH%: 10.5 % — ABNORMAL LOW (ref 14.0–49.0)
MCH: 32 pg (ref 27.2–33.4)
MCHC: 34.4 g/dL (ref 32.0–36.0)
MCV: 92.8 fL (ref 79.3–98.0)
MONO#: 0.5 10*3/uL (ref 0.1–0.9)
MONO%: 12.1 % (ref 0.0–14.0)
NEUT#: 3.1 10*3/uL (ref 1.5–6.5)
NEUT%: 74 % (ref 39.0–75.0)
Platelets: 241 10*3/uL (ref 140–400)
RBC: 4.19 10*6/uL — ABNORMAL LOW (ref 4.20–5.82)
RDW: 11.8 % (ref 11.0–14.6)
WBC: 4.2 10*3/uL (ref 4.0–10.3)
lymph#: 0.4 10*3/uL — ABNORMAL LOW (ref 0.9–3.3)

## 2014-03-12 LAB — BASIC METABOLIC PANEL (CC13)
ANION GAP: 8 meq/L (ref 3–11)
BUN: 20 mg/dL (ref 7.0–26.0)
CO2: 26 mEq/L (ref 22–29)
Calcium: 10.3 mg/dL (ref 8.4–10.4)
Chloride: 107 mEq/L (ref 98–109)
Creatinine: 1.3 mg/dL (ref 0.7–1.3)
Glucose: 105 mg/dl (ref 70–140)
POTASSIUM: 4 meq/L (ref 3.5–5.1)
Sodium: 141 mEq/L (ref 136–145)

## 2014-03-12 NOTE — Progress Notes (Signed)
He rates his pain as a 1 on a scale of 0-10. Pt complains of skin sensitiveness and itching and sore throat.  Pt presenting appropriate quality, quantity and organization of sentences. Pt denies dysphagia. PO Diet: Regular, soft. Reports drinking Ensure/boost 4 cans a day. Oral exam reveals mucous membranes moist, pharynx normal without lesions. Skin erythema - neck/chest.

## 2014-03-12 NOTE — Progress Notes (Signed)
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  CBCT/MVCT images/Port film x-rays were reviewed.  The chart was checked. Doing well, no new complaints. Prefers TUMS to sucralfate.  Minimal pain. Skin over neck is dry, stings to apply Biafine to lower neck  Physical Findings:  weight is 146 lb 11.2 oz (66.543 kg). His oral temperature is 98.1 F (36.7 C). His blood pressure is 132/63 and his pulse is 75. His respiration is 12 and oxygen saturation is 100%.  Oropharyngeal mucosa- patch of mucositis in left soft palate. No thrush..  Skin dry over neck.     CBC    Component Value Date/Time   WBC 4.2 03/12/2014 0821   WBC 5.5 09/07/2013 1032   RBC 4.19* 03/12/2014 0821   RBC 4.75 09/07/2013 1032   HGB 13.4 03/12/2014 0821   HGB 15.7 09/07/2013 1032   HCT 38.9 03/12/2014 0821   HCT 45.0 09/07/2013 1032   PLT 241 03/12/2014 0821   PLT 240 09/07/2013 1032   MCV 92.8 03/12/2014 0821   MCV 94.7 09/07/2013 1032   MCH 32.0 03/12/2014 0821   MCH 33.1 09/07/2013 1032   MCHC 34.4 03/12/2014 0821   MCHC 34.9 09/07/2013 1032   RDW 11.8 03/12/2014 0821   RDW 13.7 09/07/2013 1032   LYMPHSABS 0.4* 03/12/2014 0821   LYMPHSABS 1.6 09/07/2013 1032   MONOABS 0.5 03/12/2014 0821   MONOABS 0.6 09/07/2013 1032   EOSABS 0.1 03/12/2014 0821   EOSABS 0.1 09/07/2013 1032   BASOSABS 0.0 03/12/2014 0821   BASOSABS 0.1 09/07/2013 1032     CMP     Component Value Date/Time   NA 141 03/12/2014 0820   NA 138 09/07/2013 1032   K 4.0 03/12/2014 0820   K 4.1 09/07/2013 1032   CL 101 09/07/2013 1032   CO2 26 03/12/2014 0820   CO2 26 09/07/2013 1032   GLUCOSE 105 03/12/2014 0820   GLUCOSE 95 09/07/2013 1032   BUN 20.0 03/12/2014 0820   BUN 14 09/07/2013 1032   CREATININE 1.3 03/12/2014 0820   CREATININE 0.88 09/07/2013 1032   CREATININE 0.9 03/29/2009  0912   CALCIUM 10.3 03/12/2014 0820   CALCIUM 9.0 09/07/2013 1032   PROT 7.4 09/07/2013 1032   ALBUMIN 4.1 09/07/2013 1032   AST 28 09/07/2013 1032   ALT 32 09/07/2013 1032   ALKPHOS 63 09/07/2013 1032   BILITOT 0.6 09/07/2013 1032   GFRNONAA 90.61 03/29/2009 0912   GFRAA  Value: >60        The eGFR has been calculated using the MDRD equation. This calculation has not been validated in all clinical situations. eGFR's persistently <60 mL/min signify possible Chronic Kidney Disease. 06/19/2008 0335     Impression:  The patient is tolerating radiotherapy.   Plan:  Continue radiotherapy as planned. Apply aloe vera gel to areas of skin when Biafine stings too much. Use topicals TID. Increase nutritional shakes by 2 per day (weight loss).  -----------------------------------  Eppie Gibson, MD

## 2014-03-13 ENCOUNTER — Ambulatory Visit
Admission: RE | Admit: 2014-03-13 | Discharge: 2014-03-13 | Disposition: A | Discharge status: Home / Self Care | Payer: Medicare Other | Source: Ambulatory Visit | Attending: Radiation Oncology | Admitting: Radiation Oncology

## 2014-03-13 DIAGNOSIS — Z510 Encounter for antineoplastic radiation therapy: Secondary | ICD-10-CM | POA: Diagnosis not present

## 2014-03-14 ENCOUNTER — Ambulatory Visit
Admission: RE | Admit: 2014-03-14 | Discharge: 2014-03-14 | Disposition: A | Discharge status: Home / Self Care | Payer: Medicare Other | Source: Ambulatory Visit | Attending: Radiation Oncology | Admitting: Radiation Oncology

## 2014-03-14 DIAGNOSIS — C099 Malignant neoplasm of tonsil, unspecified: Secondary | ICD-10-CM

## 2014-03-14 DIAGNOSIS — Z510 Encounter for antineoplastic radiation therapy: Secondary | ICD-10-CM | POA: Diagnosis not present

## 2014-03-14 MED ORDER — BIAFINE EX EMUL
Freq: Two times a day (BID) | CUTANEOUS | Status: DC
  Administered 2014-03-14: 09:00:00 via TOPICAL

## 2014-03-15 ENCOUNTER — Ambulatory Visit
Admission: RE | Admit: 2014-03-15 | Discharge: 2014-03-15 | Disposition: A | Discharge status: Home / Self Care | Payer: Medicare Other | Source: Ambulatory Visit | Attending: Radiation Oncology | Admitting: Radiation Oncology

## 2014-03-15 DIAGNOSIS — Z510 Encounter for antineoplastic radiation therapy: Secondary | ICD-10-CM | POA: Diagnosis not present

## 2014-03-16 ENCOUNTER — Ambulatory Visit
Admission: RE | Admit: 2014-03-16 | Discharge: 2014-03-16 | Disposition: A | Discharge status: Home / Self Care | Payer: Medicare Other | Source: Ambulatory Visit | Attending: Radiation Oncology | Admitting: Radiation Oncology

## 2014-03-16 DIAGNOSIS — Z510 Encounter for antineoplastic radiation therapy: Secondary | ICD-10-CM | POA: Diagnosis not present

## 2014-03-19 ENCOUNTER — Inpatient Hospital Stay
Admission: RE | Admit: 2014-03-19 | Discharge: 2014-03-19 | Disposition: A | Discharge status: Home / Self Care | Payer: Medicare Other | Source: Ambulatory Visit | Attending: Radiation Oncology | Admitting: Radiation Oncology

## 2014-03-19 ENCOUNTER — Ambulatory Visit: Payer: Medicare Other | Attending: Radiation Oncology

## 2014-03-19 ENCOUNTER — Ambulatory Visit: Payer: Medicare Other

## 2014-03-19 ENCOUNTER — Ambulatory Visit
Admission: RE | Admit: 2014-03-19 | Discharge: 2014-03-19 | Disposition: A | Discharge status: Home / Self Care | Payer: Medicare Other | Source: Ambulatory Visit | Attending: Radiation Oncology | Admitting: Radiation Oncology

## 2014-03-19 VITALS — BP 119/63 | HR 78 | Temp 98.0°F | Resp 12 | Wt 145.4 lb

## 2014-03-19 DIAGNOSIS — R293 Abnormal posture: Secondary | ICD-10-CM | POA: Diagnosis not present

## 2014-03-19 DIAGNOSIS — Z5189 Encounter for other specified aftercare: Secondary | ICD-10-CM | POA: Diagnosis not present

## 2014-03-19 DIAGNOSIS — R1310 Dysphagia, unspecified: Secondary | ICD-10-CM

## 2014-03-19 DIAGNOSIS — Z510 Encounter for antineoplastic radiation therapy: Secondary | ICD-10-CM | POA: Diagnosis not present

## 2014-03-19 DIAGNOSIS — C099 Malignant neoplasm of tonsil, unspecified: Secondary | ICD-10-CM

## 2014-03-19 NOTE — Progress Notes (Signed)
   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  CBCT/MVCT images/Port film x-rays were reviewed.  The chart was checked. Doing well, no new complaints. Minimal pain. Using Aloe and Biafine over neck. Eating burgers and hotdogs.  Taste is still intact.  Physical Findings:  weight is 145 lb 6.4 oz (65.953 kg). His oral temperature is 98 F (36.7 C). His blood pressure is 119/63 and his pulse is 78. His respiration is 12 and oxygen saturation is 98%.  Oropharyngeal mucosa moist with patch of mucositis in left soft palate. No thrush.  Skin dry over neck.     CBC    Component Value Date/Time   WBC 4.2 03/12/2014 0821   WBC 5.5 09/07/2013 1032   RBC 4.19* 03/12/2014 0821   RBC 4.75 09/07/2013 1032   HGB 13.4 03/12/2014 0821   HGB 15.7 09/07/2013 1032   HCT 38.9 03/12/2014 0821   HCT 45.0 09/07/2013 1032   PLT 241 03/12/2014 0821   PLT 240 09/07/2013 1032   MCV 92.8 03/12/2014 0821   MCV 94.7 09/07/2013 1032   MCH 32.0 03/12/2014 0821   MCH 33.1 09/07/2013 1032   MCHC 34.4 03/12/2014 0821   MCHC 34.9 09/07/2013 1032   RDW 11.8 03/12/2014 0821   RDW 13.7 09/07/2013 1032   LYMPHSABS 0.4* 03/12/2014 0821   LYMPHSABS 1.6 09/07/2013 1032   MONOABS 0.5 03/12/2014 0821   MONOABS 0.6 09/07/2013 1032   EOSABS 0.1 03/12/2014 0821   EOSABS 0.1 09/07/2013 1032   BASOSABS 0.0 03/12/2014 0821   BASOSABS 0.1 09/07/2013 1032     CMP     Component Value Date/Time   NA 141 03/12/2014 0820   NA 138 09/07/2013 1032   K 4.0 03/12/2014 0820   K 4.1 09/07/2013 1032   CL 101 09/07/2013 1032   CO2 26 03/12/2014 0820   CO2 26 09/07/2013 1032   GLUCOSE 105 03/12/2014 0820   GLUCOSE 95 09/07/2013 1032   BUN 20.0 03/12/2014 0820   BUN 14 09/07/2013 1032   CREATININE 1.3 03/12/2014 0820   CREATININE 0.88 09/07/2013 1032   CREATININE 0.9 03/29/2009 0912   CALCIUM 10.3 03/12/2014 0820   CALCIUM 9.0 09/07/2013 1032   PROT 7.4 09/07/2013 1032   ALBUMIN 4.1 09/07/2013 1032   AST 28 09/07/2013 1032   ALT 32 09/07/2013 1032   ALKPHOS 63 09/07/2013 1032   BILITOT 0.6 09/07/2013 1032   GFRNONAA 90.61 03/29/2009 0912   GFRAA  06/19/2008 0335    >60        The eGFR has been calculated using the MDRD equation. This calculation has not been validated in all clinical situations. eGFR's persistently <60 mL/min signify possible Chronic Kidney Disease.     Impression:  The patient is tolerating radiotherapy.   Plan:  Continue radiotherapy as planned.  -----------------------------------   , MD   

## 2014-03-19 NOTE — Patient Instructions (Addendum)
Continue HEP as prescribed Remember why completing HEP - no muscle hardening! Take pain med/s prior to completing HEP if you need to, in order to get necessary reps in per day. Do HEP during TV at night during commercials, and once more straight through during the day.  SWALLOWING EXERCISES 1. Effortful Swallows - Squeeze hard with the muscles in your neck while you swallow your  saliva or a sip of water - Repeat up to 20 times, 2 times a day, and use whenever you eat or drink  2. Masako Swallow - swallow with your tongue sticking out - Stick tongue out and gently bite tongue with your teeth - Swallow, while holding your tongue with your teeth - Repeat up to 20 times, 2 times a day  3. Pitch Raise - Repeat "he", once per second in as high of a pitch as you can - Repeat 20 times, 2 times a day  4. Mendelsohn Maneuver - "half swallow" exercise - Start to swallow, and keep your Adam's apple up by squeezing hard with the muscles of the throat - Hold the squeeze for 5 seconds and then relax - Repeat up to 20 times, 2 times a day  5. Tongue Press - Press your entire tongue as hard as you can against the roof of your mouth for 10 seconds - Repeat 10 times, 2 times a day  6. Breath Hold - Say "HUH!" loudly, holding your breath tightly at the level of your voice box for 3 seconds - Repeat 20 times, 2 times a day  7. Chin pushback - Open your mouth  - Place your fist UNDER your chin near your neck, and push back with your fist for 5 seconds - Repeat up to 20 times, 2 times a day  Pt was provided a paper handout of the swallowing exercises above on 02-05-14 (reviewed on 03-19-14)

## 2014-03-19 NOTE — Progress Notes (Addendum)
He is currently in no pain. Pt complains of skin sensitiveness and itching and sore throat.  Pt presenting appropriate quality, quantity and organization of sentences. Denies Dysphagia. PO Diet: Regular, mechanically soft. Oral exam reveals mucous membranes moist, pharynx normal without lesions. Skin erythema, Dryness, Pruritus and yellow scabbing over neck, clavicle and upper back. Reports applying Biafine tid.

## 2014-03-19 NOTE — Therapy (Signed)
Speech Language Pathology Treatment  Patient Details  Name: Matthew Moreno MRN: [id number] Date of Birth: 02/17/1946  Encounter Date: 03/19/2014      End of Session - 03/19/14 1407    Visit Number 2   Number of Visits 3   Date for OT Re-Evaluation 04/06/14   SLP Start Time 50   SLP Stop Time 1403   SLP Time Calculation (min) 43 min   Activity Tolerance Patient tolerated treatment well      Past Medical History  Diagnosis Date  . Nephrolithiasis     H/O multiple "kidney stones", Lithotripsies, J-Stents  . CAD (coronary artery disease) 2008  . Dyslipidemia   . Panic attacks   . Family history of heart disease   . Hypertension   . Statin intolerance   . 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 Colonoscopy  2010    Dr. Ardis Hughs  . Biceps tendon repair Left   . Cardiac catheterization  06/19/2008    normal left main, Cfx with 3 OMs (50% osital narrowing in OM1), LAD densely calcified in the proximal half of vessel with eccentric narrowing in mid region (40-50% severity), diag 1 & 2 free of disease, RCA with less than 30% narrowing in midportion (Dr. Loni Muse. [place])    There were no vitals taken for this visit.  Visit Diagnosis: Dysphagia          ADULT SLP TREATMENT - 03/19/14 1300    Behavior/Cognition Alert;Cooperative;Pleasant mood  "I do some (of the HEP) every day," not frequency prescribed   Treatment provided Dysphagia   Treatment Methods Therapeutic exercise;Patient/caregiver education;Skilled observation   Patient observed directly with PO's Yes   Type of PO's observed Dysphagia 3 (soft);Thin liquids   Other treatment/comments Ten rad tx left. Pt needed to read 3 exercises off of HEP. Mod A rarely with breath hold, and Mendelsohn. Pt ate ham and drank H2O without overt s/s aspiration today. Reports starchy foods require H2O wash. Encouraged pt to complete HEP after pain med administration, PRN, as pt c/o inability  to perform reps due to "being too much" to do. SLP encouraged pt to complete reps and suggested  one full regimen with additional frequency, piecemeal, during TV at night, etc.   Pain Assessment 0-10   Pain Score 1    Pain Location --  throat   Pain Descriptors / Indicators Discomfort   Pain Intervention(s) Monitored during session   Plan Continue with current plan of care   Progression toward goals Progressing toward goals  STG 2 met          Education - 03/19/14 1406    Education provided Yes   Education Details HEP frequency, # reps needed and why (inhibit muscle fibrosis)   Education Details Patient   Methods Explanation   Comprehension Verbalized understanding          SLP Short Term Goals - 03/19/14 1411    Title pt will complete HEP with min A rarely   Time 4   Period Weeks   Status New   Title pt will tell SLP why he is completing HEP with modified independence   Time 4   Period Weeks   Status Achieved          SLP Long Term Goals - 03/19/14 1412    Title pt will demonstrate independence with HEP   Time 8   Period Weeks   Status  New   Title pt will tell SLP three signs of aspiration PNA with modified assistance   Time 8   Period Weeks   Status New   Title pt will tell SLP three types of foods recommended during healing from rad tx   Time 8   Period Weeks   Status New          Plan - 03/19/14 1416    Speech Therapy Frequency min 1 x/week        Problem List Patient Active Problem List   Diagnosis Date Noted  . Tonsil cancer 01/23/2014  . Statin intolerance 07/13/2012  . Hypertriglyceridemia 06/23/2011  . CAD (coronary artery disease) 06/23/2011  . Former smoker 06/23/2011  . DYSPEPSIA&OTHER [facility] DISORDERS FUNCTION STOMACH 03/29/2009                                             [facility] 03/19/2014, 2:16 PM

## 2014-03-20 ENCOUNTER — Ambulatory Visit
Admission: RE | Admit: 2014-03-20 | Discharge: 2014-03-20 | Disposition: A | Discharge status: Home / Self Care | Payer: Medicare Other | Source: Ambulatory Visit | Attending: Radiation Oncology | Admitting: Radiation Oncology

## 2014-03-20 DIAGNOSIS — Z510 Encounter for antineoplastic radiation therapy: Secondary | ICD-10-CM | POA: Diagnosis not present

## 2014-03-21 ENCOUNTER — Ambulatory Visit
Admission: RE | Admit: 2014-03-21 | Discharge: 2014-03-21 | Disposition: A | Discharge status: Home / Self Care | Payer: Medicare Other | Source: Ambulatory Visit | Attending: Radiation Oncology | Admitting: Radiation Oncology

## 2014-03-21 DIAGNOSIS — Z510 Encounter for antineoplastic radiation therapy: Secondary | ICD-10-CM | POA: Diagnosis not present

## 2014-03-22 ENCOUNTER — Ambulatory Visit
Admission: RE | Admit: 2014-03-22 | Discharge: 2014-03-22 | Disposition: A | Discharge status: Home / Self Care | Payer: Medicare Other | Source: Ambulatory Visit | Attending: Radiation Oncology | Admitting: Radiation Oncology

## 2014-03-22 DIAGNOSIS — Z510 Encounter for antineoplastic radiation therapy: Secondary | ICD-10-CM | POA: Diagnosis not present

## 2014-03-23 ENCOUNTER — Ambulatory Visit
Admission: RE | Admit: 2014-03-23 | Discharge: 2014-03-23 | Disposition: A | Discharge status: Home / Self Care | Payer: Medicare Other | Source: Ambulatory Visit | Attending: Radiation Oncology | Admitting: Radiation Oncology

## 2014-03-23 ENCOUNTER — Ambulatory Visit: Payer: Medicare Other | Admitting: Nutrition

## 2014-03-23 DIAGNOSIS — C099 Malignant neoplasm of tonsil, unspecified: Secondary | ICD-10-CM

## 2014-03-23 DIAGNOSIS — Z510 Encounter for antineoplastic radiation therapy: Secondary | ICD-10-CM | POA: Diagnosis not present

## 2014-03-23 MED ORDER — BIAFINE EX EMUL
CUTANEOUS | Status: DC | PRN
  Administered 2014-03-23: 09:00:00 via TOPICAL

## 2014-03-23 NOTE — Progress Notes (Signed)
Nutrition followup completed with patient being treated with radiation therapy for tonsil cancer. Weight decreased and documented as 145.4 pounds November 2, down from 148 pounds October 23. Patient reports eating is difficult.   Taste alterations are beginning to occur.   He is drinking between 2 and 4 oral nutrition supplements daily.  Utilizing boost plus or [employer]. Patient has one more week of radiation therapy.  Nutrition diagnosis: Predicted suboptimal energy intake has evolved into inadequate oral intake related to tonsil cancer and radiation therapy.  As evidenced by 3 pound weight loss.  Intervention: Patient educated to increase oral nutrition supplements to 4 - 6 bottles daily. Provided patient with recipes to alter and improve the taste of oral nutrition supplements. Encouraged patient to use blenderized foods. Questions were answered.  Teach back method used.  Monitoring, evaluation, goals: Patient will continue to attempt to increase oral intake to minimize furtherweight loss.  Next visit: Friday, November 13, after radiation therapy.  **Disclaimer: This note was dictated with voice recognition software. Similar sounding words can inadvertently be transcribed and this note may contain transcription errors which may not have been corrected upon publication of note.**

## 2014-03-26 ENCOUNTER — Encounter: Payer: Self-pay | Admitting: *Deleted

## 2014-03-26 ENCOUNTER — Telehealth: Payer: Self-pay | Admitting: *Deleted

## 2014-03-26 ENCOUNTER — Encounter: Payer: Self-pay | Admitting: Radiation Oncology

## 2014-03-26 ENCOUNTER — Ambulatory Visit
Admission: RE | Admit: 2014-03-26 | Discharge: 2014-03-26 | Disposition: A | Discharge status: Home / Self Care | Payer: Medicare Other | Source: Ambulatory Visit | Attending: Radiation Oncology | Admitting: Radiation Oncology

## 2014-03-26 VITALS — BP 123/71 | HR 88 | Temp 99.3°F | Resp 16 | Wt 142.6 lb

## 2014-03-26 DIAGNOSIS — C099 Malignant neoplasm of tonsil, unspecified: Secondary | ICD-10-CM

## 2014-03-26 DIAGNOSIS — Z510 Encounter for antineoplastic radiation therapy: Secondary | ICD-10-CM | POA: Diagnosis not present

## 2014-03-26 LAB — BASIC METABOLIC PANEL (CC13)
Anion Gap: 10 mEq/L (ref 3–11)
BUN: 20.1 mg/dL (ref 7.0–26.0)
CALCIUM: 10 mg/dL (ref 8.4–10.4)
CHLORIDE: 106 meq/L (ref 98–109)
CO2: 26 mEq/L (ref 22–29)
CREATININE: 1.2 mg/dL (ref 0.7–1.3)
Glucose: 101 mg/dl (ref 70–140)
Potassium: 4 mEq/L (ref 3.5–5.1)
Sodium: 142 mEq/L (ref 136–145)

## 2014-03-26 NOTE — Progress Notes (Signed)
To provide support and encouragement, care continuity and to assess for needs, met with patient during his weekly UT with Dr. Isidore Moos.  He reported: 1. Looking forward to final tmt next Monday. 2. Throat is sore, using Lidocaine before eating/drinking with some relief.  We discussed how throat discomfort will begin to resolve 2-3 weeks after final tmt.  3. Recognition of a few more lbs weight loss, happy with current weight "I was too heavy before".  Diligent about drinking 4 cans of nutritional supplement daily. 4. Applying Biafine to neck 3-4 times daily.  Using Dove soap on neck/splashing water to cleanse.  We discussed that warm water shower spray to neck area is also beneficial. 5. Experiences heart burn with fluid/food intake, resolves with TUMS.  He discussed with Dr. Isidore Moos. 6. Seeing/talking with mentor Vivia Ewing on a weekly basis. He denied additional needs/concerns, knows he can contact me.  Gayleen Orem, RN, BSN, [place] [facility] [place] [phone]

## 2014-03-26 NOTE — Progress Notes (Signed)
   Weekly Management Note:  Outpatient    ICD-9-CM ICD-10-CM   1. Tonsil cancer 146.0 S96.2 Basic metabolic panel    Current Dose:  60 Gy  Projected Dose: 70 Gy   Narrative:  The patient presents for routine under treatment assessment.  CBCT/MVCT images/Port film x-rays were reviewed.  The chart was checked. Doing relatively well.  Flaking of skin over neck. Scratchy throat.  No nausea. Drinking boost, eating soft foods, and a bottle of water daily.   Physical Findings:  weight is 142 lb 9.6 oz (64.683 kg). His oral temperature is 99.3 F (37.4 C). His blood pressure is 123/71 and his pulse is 88. His respiration is 16.  NAD, dry desquamation over neck.  Confluent mucositis, left tonsillar region. No thrush.  CBC    Component Value Date/Time   WBC 4.2 03/12/2014 0821   WBC 5.5 09/07/2013 1032   RBC 4.19* 03/12/2014 0821   RBC 4.75 09/07/2013 1032   HGB 13.4 03/12/2014 0821   HGB 15.7 09/07/2013 1032   HCT 38.9 03/12/2014 0821   HCT 45.0 09/07/2013 1032   PLT 241 03/12/2014 0821   PLT 240 09/07/2013 1032   MCV 92.8 03/12/2014 0821   MCV 94.7 09/07/2013 1032   MCH 32.0 03/12/2014 0821   MCH 33.1 09/07/2013 1032   MCHC 34.4 03/12/2014 0821   MCHC 34.9 09/07/2013 1032   RDW 11.8 03/12/2014 0821   RDW 13.7 09/07/2013 1032   LYMPHSABS 0.4* 03/12/2014 0821   LYMPHSABS 1.6 09/07/2013 1032   MONOABS 0.5 03/12/2014 0821   MONOABS 0.6 09/07/2013 1032   EOSABS 0.1 03/12/2014 0821   EOSABS 0.1 09/07/2013 1032   BASOSABS 0.0 03/12/2014 0821   BASOSABS 0.1 09/07/2013 1032     CMP     Component Value Date/Time   NA 141 03/12/2014 0820   NA 138 09/07/2013 1032   K 4.0 03/12/2014 0820   K 4.1 09/07/2013 1032   CL 101 09/07/2013 1032   CO2 26 03/12/2014 0820   CO2 26 09/07/2013 1032   GLUCOSE 105 03/12/2014 0820   GLUCOSE 95 09/07/2013 1032   BUN 20.0 03/12/2014 0820   BUN 14 09/07/2013 1032   CREATININE 1.3 03/12/2014 0820   CREATININE 0.88 09/07/2013 1032   CREATININE  0.9 03/29/2009 0912   CALCIUM 10.3 03/12/2014 0820   CALCIUM 9.0 09/07/2013 1032   PROT 7.4 09/07/2013 1032   ALBUMIN 4.1 09/07/2013 1032   AST 28 09/07/2013 1032   ALT 32 09/07/2013 1032   ALKPHOS 63 09/07/2013 1032   BILITOT 0.6 09/07/2013 1032   GFRNONAA 90.61 03/29/2009 0912   GFRAA  06/19/2008 0335    >60        The eGFR has been calculated using the MDRD equation. This calculation has not been validated in all clinical situations. eGFR's persistently <60 mL/min signify possible Chronic Kidney Disease.     Impression:  The patient is tolerating radiotherapy.   Plan:  Continue radiotherapy as planned. Check labs today to eval for IVF, if needed. Push PO intake.  -----------------------------------  Eppie Gibson, MD

## 2014-03-26 NOTE — Progress Notes (Signed)
Weekly rad tx here before tx tonsil, has complemented 29 txs so far,  dry desquamation on neck, erythema, scratchy throat , loss 3 lbs since 03/19/14, sitting b/p=118/55,P=82,T=99.3 standing  B/p=123/71,P=82 RR+16, drinks  4 boost daily, ate cod fish baked last night,  No nausea, 35 minutes later felt scratchy throat, then was sore rest of the night, saw B.Neff 03/23/14 eat s big meals, suggested to graze and have smaller meals, takes 3-4 rolaids for heartburn, takes lidocaine gel with scant water numbs throat 25-20 minutes No nausea 7:41 AM

## 2014-03-27 ENCOUNTER — Ambulatory Visit
Admission: RE | Admit: 2014-03-27 | Discharge: 2014-03-27 | Disposition: A | Discharge status: Home / Self Care | Payer: Medicare Other | Source: Ambulatory Visit | Attending: Radiation Oncology | Admitting: Radiation Oncology

## 2014-03-27 DIAGNOSIS — Z510 Encounter for antineoplastic radiation therapy: Secondary | ICD-10-CM | POA: Diagnosis not present

## 2014-03-27 NOTE — Telephone Encounter (Addendum)
Matthew Moreno notified on 03/26/14 that his labs work does not require any fluid hydration at this time, but advised to continue to hydrate and increase po intake.  He stated agreement with this advice

## 2014-03-28 ENCOUNTER — Ambulatory Visit
Admission: RE | Admit: 2014-03-28 | Discharge: 2014-03-28 | Disposition: A | Discharge status: Home / Self Care | Payer: Medicare Other | Source: Ambulatory Visit | Attending: Radiation Oncology | Admitting: Radiation Oncology

## 2014-03-28 DIAGNOSIS — Z510 Encounter for antineoplastic radiation therapy: Secondary | ICD-10-CM | POA: Diagnosis not present

## 2014-03-29 ENCOUNTER — Ambulatory Visit
Admission: RE | Admit: 2014-03-29 | Discharge: 2014-03-29 | Disposition: A | Discharge status: Home / Self Care | Payer: Medicare Other | Source: Ambulatory Visit | Attending: Radiation Oncology | Admitting: Radiation Oncology

## 2014-03-29 DIAGNOSIS — Z510 Encounter for antineoplastic radiation therapy: Secondary | ICD-10-CM | POA: Diagnosis not present

## 2014-03-30 ENCOUNTER — Ambulatory Visit: Payer: Medicare Other | Admitting: Nutrition

## 2014-03-30 ENCOUNTER — Other Ambulatory Visit: Payer: Self-pay | Admitting: Internal Medicine

## 2014-03-30 ENCOUNTER — Ambulatory Visit
Admission: RE | Admit: 2014-03-30 | Discharge: 2014-03-30 | Disposition: A | Discharge status: Home / Self Care | Payer: Medicare Other | Source: Ambulatory Visit | Attending: Radiation Oncology | Admitting: Radiation Oncology

## 2014-03-30 DIAGNOSIS — Z510 Encounter for antineoplastic radiation therapy: Secondary | ICD-10-CM | POA: Diagnosis not present

## 2014-03-30 DIAGNOSIS — C099 Malignant neoplasm of tonsil, unspecified: Secondary | ICD-10-CM

## 2014-03-30 MED ORDER — BIAFINE EX EMUL
Freq: Two times a day (BID) | CUTANEOUS | Status: DC
  Administered 2014-03-30: 10:00:00 via TOPICAL

## 2014-03-30 NOTE — Progress Notes (Signed)
Nutrition followup completed with patient being treated with radiation therapy for tonsil cancer  His last radiation treatment is Monday, November 18. Eating continues to be difficult.  He is trying to drink oral nutrition supplements daily.  He reports taste alterations.  Patient describes instances of nausea with gagging.  Nutrition diagnosis: Inadequate oral intake continues.  Intervention: Patient educated to increase oral nutrition supplements to a minimum of 4-6 bottles daily. Encouraged soft foods as tolerated. Encouraged patient to discuss nausea medication with MD if nausea continues.  Monitoring, evaluation, goals: Patient will work to increase oral intake to promote weight maintenance.  Patient agrees to contact me in the future as needed.  **Disclaimer: This note was dictated with voice recognition software. Similar sounding words can inadvertently be transcribed and this note may contain transcription errors which may not have been corrected upon publication of note.**

## 2014-04-02 ENCOUNTER — Encounter: Payer: Self-pay | Admitting: *Deleted

## 2014-04-02 ENCOUNTER — Ambulatory Visit: Payer: Medicare Other

## 2014-04-02 ENCOUNTER — Encounter: Payer: Self-pay | Admitting: Radiation Oncology

## 2014-04-02 ENCOUNTER — Ambulatory Visit
Admission: RE | Admit: 2014-04-02 | Discharge: 2014-04-02 | Disposition: A | Discharge status: Home / Self Care | Payer: Medicare Other | Source: Ambulatory Visit | Attending: Radiation Oncology | Admitting: Radiation Oncology

## 2014-04-02 VITALS — BP 119/62 | HR 75 | Temp 98.1°F | Ht 66.0 in

## 2014-04-02 DIAGNOSIS — Z510 Encounter for antineoplastic radiation therapy: Secondary | ICD-10-CM | POA: Diagnosis not present

## 2014-04-02 DIAGNOSIS — C099 Malignant neoplasm of tonsil, unspecified: Secondary | ICD-10-CM

## 2014-04-02 NOTE — Progress Notes (Signed)
  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    Plan: The patient has completed radiation treatment. The patient will return to radiation oncology clinic for routine followup in one half month. I advised them to call or return sooner if they have any questions or concerns related to their recovery or treatment.  -----------------------------------  Eppie Gibson, MD

## 2014-04-02 NOTE — Progress Notes (Signed)
   Weekly Management Note  Outpatient    ICD-9-CM ICD-10-CM   1. Tonsil cancer 146.0 C09.9     Completed Radiotherapy. Total Dose:  70Gy   Narrative:  The patient presents for routine under treatment assessment on last day of radiotherapy.  CBCT/MVCT images/Port film x-rays were reviewed.  The chart was checked. Drinking well. 5 shakes daily. H20 - plently. Not lightheaded.  Feels as good as last week.    Physical Findings:  height is 5\' 6"  (1.676 m). His temperature is 98.1 F (36.7 C). His blood pressure is 119/62 and his pulse is 75.   Patchy mucositis in throat. Dry scaling skin over neck  Impression:  The patient has tolerated radiotherapy.  Plan:  Routine follow-up in 2 weeks. Discussed signs of dehydration to warrant f/u sooner, if needed. ________________________________   Eppie Gibson, M.D.

## 2014-04-02 NOTE — Progress Notes (Signed)
Matthew Moreno has completed radiation therapy today tonsil and bilateral neck.  He c/o level 4/10 soreness of his throat.  Eats soft foods and eats a combination  of boost, ensure and Instant Breakfast for a total of 5 cans daily.  Note redness with dry crustig of skin on the left side of neck.  Continues to use Biafine BID.  Note mucositis in the posterior, left throat region.

## 2014-04-17 NOTE — Progress Notes (Signed)
Met with pt during final RT to offer support and to celebrate end of radiation treatment.   I explained that my role as navigator will continue for several more months and that I will be calling and/or joining him during follow-up visits.  Pt verbalized understanding.  Gayleen Orem, RN, BSN, [place] [facility] [place] [phone]

## 2014-04-19 ENCOUNTER — Encounter: Payer: Self-pay | Admitting: Radiation Oncology

## 2014-04-20 ENCOUNTER — Encounter: Payer: Self-pay | Admitting: Radiation Oncology

## 2014-04-20 ENCOUNTER — Ambulatory Visit
Admission: RE | Admit: 2014-04-20 | Discharge: 2014-04-20 | Disposition: A | Discharge status: Home / Self Care | Payer: Medicare Other | Source: Ambulatory Visit | Attending: Radiation Oncology | Admitting: Radiation Oncology

## 2014-04-20 VITALS — BP 147/64 | HR 88 | Temp 97.9°F | Ht 66.0 in | Wt 143.2 lb

## 2014-04-20 DIAGNOSIS — R634 Abnormal weight loss: Secondary | ICD-10-CM

## 2014-04-20 DIAGNOSIS — C099 Malignant neoplasm of tonsil, unspecified: Secondary | ICD-10-CM

## 2014-04-20 HISTORY — DX: Personal history of irradiation: Z92.3

## 2014-04-20 NOTE — Progress Notes (Signed)
Radiation Oncology         [phone] ________________________________  Name: Matthew Moreno MRN: [id number]  Date: 04/20/2014  DOB: 05/16/1946  Follow-Up Visit Note  CC: [name], MD  [name], *  Diagnosis and Prior Radiotherapy:       ICD-9-CM ICD-10-CM   1. 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     He reports that he is eating without any pain nor difficulty swallowing, but notes formation of blisters in the posterior region of his mouth when eating anything acidic. Doing well, pain free. Skin has healed nicely.   ALLERGIES:  is allergic to bystolic; lipitor; lisinopril; metoprolol; pravastatin; red yeast rice; sulfonamide derivatives; and zocor.  Meds: Current Outpatient Prescriptions  Medication Sig Dispense Refill  . aspirin 81 MG tablet Take 160 mg by mouth daily.    . calcium carbonate (TUMS EX) 750 MG chewable tablet Chew 1 tablet by mouth daily.    . fenofibrate 160 MG tablet Take 1 tablet (160 mg total) by mouth daily. 30 tablet 6  . GARLIC PO Take 1 capsule by mouth daily.    . metoprolol tartrate (LOPRESSOR) 25 MG tablet TAKE 1/2 TABLET (12.5mg ) BY MOUTH TWICE DAILY 90 tablet 1  . Multiple Vitamins-Minerals (MULTIVITAMIN WITH MINERALS) tablet Take 1 tablet by mouth daily.    . Omega-3 Fatty Acids (FISH OIL) 1000 MG CAPS Take 1,000 mg by mouth 2 (two) times daily.    . sodium fluoride (FLUORISHIELD) 1.1 % GEL dental gel Instill 1 drop into each tooth space of fluoride tray. Place over teeth for 5 minutes. Remove. Spit out excess.  Repeat nightly. 120 mL prn  . ALPRAZolam (XANAX) 0.5 MG tablet Take 1 tablet (0.5 mg total) by mouth at bedtime as needed. (Patient not taking: Reported on 04/20/2014) 30 tablet 4   No current facility-administered medications for this encounter.   Facility-Administered Medications Ordered in Other Encounters  Medication Dose Route Frequency Provider Last Rate Last Dose  . influenza  inactive virus vaccine (FLUZONE/FLUARIX) injection 0.5 mL  0.5 mL Intramuscular Once Denita Lung, MD        Physical Findings: The patient is in no acute distress. Patient is alert and oriented.  height is 5\' 6"  (1.676 m) and weight is 143 lb 3.2 oz (64.955 kg). His temperature is 97.9 F (36.6 C). His blood pressure is 147/64 and his pulse is 88. Marland Kitchen  Oropharyngeal mucosa with no thrush or lesions except granulation tissue in small portion of L tonsillar fossa. Small blisters in L buccal mucosa.  No palpable cervical or supraclavicular lymphadenopathy. Skin intact and smooth over neck.     Lab Findings: Lab Results  Component Value Date   WBC 4.2 03/12/2014   HGB 13.4 03/12/2014   HCT 38.9 03/12/2014   MCV 92.8 03/12/2014   PLT 241 03/12/2014    Lab Results  Component Value Date   TSH 0.834 01/30/2014    Radiographic Findings: No results found.  Impression/Plan:    1) Head and Neck Cancer Status: healing well after RT  2) Nutritional Status: - weight: stabilizing, about 10lb less than at consult. - PEG tube: none  3) Risk Factors: The patient has been educated about risk factors including alcohol and tobacco abuse; they understand that avoidance of alcohol and tobacco is important to prevent recurrences as well as other cancers  4) Swallowing: good  5) Dental: Encouraged to continue regular followup with dentistry, and dental hygiene including fluoride rinses.   6) Thyroid function: re check TSH in 3.5 mo  7) Social: No active social issues to address at this time  8) Other:  none  9) Follow-up in 3.5 months with PET and TSH. The patient was encouraged to call with any issues or questions before then.   ------------------------------------------------------  Eppie Gibson, MD

## 2014-04-20 NOTE — Progress Notes (Signed)
Mr. Wisenbaker here today for reassessment s/p radiation therapy to his left tonsil and bilateral.  He reports that he is eating without any pain nor difficulty swallowing, but notes formation of blisters in the posterior region of his mouth when eating anything acidic.  However he notes, on occasion, the presence of blisters in the left buccal mucosal region, not associated with any acid foods. Upon oral inspection note a raised area in the left upper buccal mucosal region with pink appearance.. Skin on neck very smooth and intact.

## 2014-04-23 ENCOUNTER — Telehealth: Payer: Self-pay | Admitting: *Deleted

## 2014-04-23 NOTE — Telephone Encounter (Signed)
CALLED PATIENT TO INFORM OF PET AND FU VISIT, SPOKE WITH PATIENT AND HE IS AWARE OF THESE APPTS.

## 2014-04-24 ENCOUNTER — Telehealth: Payer: Self-pay | Admitting: *Deleted

## 2014-04-24 NOTE — Telephone Encounter (Signed)
Called patient to check on status.  He reported: 1. Eating and drinking back to baseline, eating/drinking whatever he wants (though acidic foods cause mouth sores).  Drinking 2 cans of Boost/Ensure daily.  Is going to try to wean himself off supplements since he is doing so well with regular food. 2. Has gained 4 #. 3. Energy at baseline. 4. Spoke with his mentor last week.  Their relationship continues. He understands he can contact me with questions/concerns.  Gayleen Orem, RN, BSN, [facility] [place] [phone]

## 2014-04-26 ENCOUNTER — Encounter (HOSPITAL_COMMUNITY): Payer: Self-pay | Admitting: Dentistry

## 2014-04-26 ENCOUNTER — Encounter (INDEPENDENT_AMBULATORY_CARE_PROVIDER_SITE_OTHER): Payer: Self-pay

## 2014-04-26 ENCOUNTER — Ambulatory Visit (HOSPITAL_COMMUNITY): Payer: Self-pay | Admitting: Dentistry

## 2014-04-26 VITALS — BP 110/59 | HR 61 | Temp 97.8°F | Wt 143.0 lb

## 2014-04-26 DIAGNOSIS — R682 Dry mouth, unspecified: Secondary | ICD-10-CM

## 2014-04-26 DIAGNOSIS — K117 Disturbances of salivary secretion: Secondary | ICD-10-CM

## 2014-04-26 DIAGNOSIS — R432 Parageusia: Secondary | ICD-10-CM

## 2014-04-26 DIAGNOSIS — C099 Malignant neoplasm of tonsil, unspecified: Secondary | ICD-10-CM

## 2014-04-26 DIAGNOSIS — Z923 Personal history of irradiation: Secondary | ICD-10-CM

## 2014-04-26 DIAGNOSIS — Z0189 Encounter for other specified special examinations: Secondary | ICD-10-CM

## 2014-04-26 DIAGNOSIS — Z08 Encounter for follow-up examination after completed treatment for malignant neoplasm: Secondary | ICD-10-CM

## 2014-04-26 NOTE — Progress Notes (Signed)
04/26/2014  Patient Name:   Matthew Moreno Date of Birth:   05-30-1945 Medical Record Number: [id number]  BP 110/59 mmHg  Pulse 61  Temp(Src) 97.8 F (36.6 C) (Oral)  Wt 143 lb (64.864 kg)  [name] presents for oral examination after radiation therapy. Patient has completed radiation treatment from 02/12/2014 through 04/02/2014.  The patient did not have any chemotherapy.  REVIEW OF CHIEF COMPLAINTS:  DRY MOUTH: Slightly dry. HARD TO SWALLOW: No  HURT TO SWALLOW: No TASTE CHANGES: Taste is returning and is good  SORES IN MOUTH: None TRISMUS: No problems with trismus WEIGHT: 143 pounds down from an initial 152 pounds.  HOME OH REGIMEN:  BRUSHING: 3 times a day FLOSSING: 3 times a day RINSING: Using salt water and baking soda rinses and Biotene rinses. FLUORIDE: Use his fluoride at bedtime. TRISMUS EXERCISES:  Maximum interincisal opening: 43 mm. Patient is doing trismus exercises without problems.   DENTAL EXAM:  Oral Hygiene:(PLAQUE): Good oral hygiene LOCATION OF MUCOSITIS: None DESCRIPTION OF SALIVA: Incipient xerostomia ANY EXPOSED BONE: None OTHER WATCHED AREAS: None DX: Xerostomia and Dysgeusia  RECOMMENDATIONS: 1. Brush after meals and at bedtime.  Use fluoride at bedtime. 2. Use trismus exercises as directed. 3. Use Biotene Rinse or salt water/baking soda rinses. 4. Multiple sips of water as needed. 5. Return to Dr. Ashok Pall for an exam and cleaning in 2 months. Ideally patient should be seen 2-3 times a year initially and then twice a year after that due to risk for radiation caries. Patient will try but does have some economic concerns.   Lenn Cal, DDS

## 2014-04-26 NOTE — Patient Instructions (Addendum)
RECOMMENDATIONS: 1. Brush after meals and at bedtime.  Use fluoride at bedtime. 2. Use trismus exercises as directed. 3. Use Biotene Rinse or salt water/baking soda rinses. 4. Multiple sips of water as needed. 5. Return to Dr. Ashok Pall for an exam and cleaning in 2 months. Ideally patient should be seen 2-3 times a year initially and then twice a year after that due to risk for radiation caries. Patient will try but does have some economic concerns.   Lenn Cal, DDS

## 2014-05-01 ENCOUNTER — Ambulatory Visit: Payer: Medicare Other | Attending: Radiation Oncology

## 2014-05-01 ENCOUNTER — Ambulatory Visit (HOSPITAL_COMMUNITY): Payer: Self-pay | Admitting: Dentistry

## 2014-05-01 DIAGNOSIS — R1310 Dysphagia, unspecified: Secondary | ICD-10-CM

## 2014-05-01 DIAGNOSIS — Z5189 Encounter for other specified aftercare: Secondary | ICD-10-CM | POA: Insufficient documentation

## 2014-05-01 DIAGNOSIS — R293 Abnormal posture: Secondary | ICD-10-CM | POA: Diagnosis not present

## 2014-05-01 NOTE — Therapy (Signed)
[facility] [address] [place], [place], [address] Phone: [phone]   Fax:  [phone]  Speech Language Pathology Treatment  Patient Details  Name: Matthew Moreno MRN: [id number] Date of Birth: 28-Aug-1945  Encounter Date: 05/01/2014      End of Session - 05/01/14 1346    Visit Number 3   Number of Visits 3   Date for SLP Re-Evaluation 05/01/14   SLP Start Time 8   SLP Stop Time  1345   SLP Time Calculation (min) 27 min   Activity Tolerance Patient tolerated treatment well      Past Medical History  Diagnosis Date  . Nephrolithiasis     H/O multiple "kidney stones", Lithotripsies, J-Stents  . CAD (coronary artery disease) 2008  . Dyslipidemia   . Panic attacks   . Family history of heart disease   . Hypertension   . Statin intolerance   . 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 S/P radiation therapy 02/12/2014-04/02/2014    Left tonsil and bilateral neck / 70 Gy in 35 fractions to gross disease, 63 Gy in 35 fractions to high risk nodalechelons, and 56 Gy in 35 fractions to intermediate risk nodal echelons     Past Surgical History  Procedure Laterality Date  . Colonoscopy  2010    Dr. Ardis Hughs  . Biceps tendon repair Left   . Cardiac catheterization  06/19/2008    normal left main, Cfx with 3 OMs (50% osital narrowing in OM1), LAD densely calcified in the proximal half of vessel with eccentric narrowing in mid region (40-50% severity), diag 1 & 2 free of disease, RCA with less than 30% narrowing in midportion (Dr. Loni Muse. [place])    There were no vitals taken for this visit.  Visit Diagnosis: Dysphagia - Plan: SLP plan of care cert/re-cert      Subjective Assessment - 05/01/14 1323    Symptoms Pt has eaten chicken, hamburgers, stew beef.   Currently in Pain? No/denies            ADULT SLP TREATMENT - 05/01/14 1324    General Information   Behavior/Cognition Alert;Pleasant mood;Cooperative   Treatment  Provided   Treatment provided Dysphagia   Dysphagia Treatment   Other treatment/comments Pt completed HEP with independence. Told SLP aspiration PNA s/s with modified independence. Ate cereal bar and drank H2O.without overt s/s aspiration. SLP reiterated to pt to complete HEP as prescribed (x2/day) until 6 months after date of last rad tx, then x3/week following that date. Pt verbalized understanding.    Pain Assessment   Pain Assessment No/denies pain   Assessment / Recommendations / Plan   Plan Discharge SLP treatment due to (comment)  meeting all goals   Dysphagia Recommendations   Diet recommendations --  as tolerated   Progression Toward Goals   Progression toward goals Goals met, education completed, patient discharged from [place] - 05/01/14 1339    SLP [place] #1   Title pt will complete HEP with min A rarely   Time 4   Status Achieved          SLP Long Term Goals - 05/01/14 1339    SLP LONG TERM GOAL #1   Title pt will demonstrate independence with HEP   Time 8   Period Weeks   Status Achieved   SLP LONG TERM GOAL #2   Title pt will  tell SLP three signs of aspiration PNA with modified assistance   Time 8   Period Weeks   Status Achieved   SLP LONG TERM GOAL #3   Title pt will tell SLP three types of foods recommended during healing from rad tx   Time 8   Period Weeks   Status Achieved          Plan - 2014/05/04 1346    Clinical Impression Statement See d/c summary.   Treatment/Interventions Pharyngeal strengthening exercises;Oral motor exercises  assessment of safety with POs   SLP Home Exercise Plan pt expressed understanding of need for HEP BID until 6 months after last rad tx, then x3/week following that day.   Consulted and Agree with Plan of Care Patient          G-Codes - 2014-05-04 1348    Functional Assessment Tool Used noms   Functional Limitations Swallowing   Swallow Goal Status [phone]) At least 1  percent but less than 20 percent impaired, limited or restricted   Swallow Discharge Status [phone]) 0 percent impaired, limited or restricted               Problem List Patient Active Problem List   Diagnosis Date Noted  . Tonsil cancer 01/23/2014  . Statin intolerance 07/13/2012  . Hypertriglyceridemia 06/23/2011  . CAD (coronary artery disease) 06/23/2011  . Former smoker 06/23/2011  . DYSPEPSIA&OTHER [place] DISORDERS FUNCTION STOMACH 03/29/2009       SPEECH THERAPY DISCHARGE SUMMARY  Visits from Start of Care: 3  Current functional level related to goals / functional outcomes: Pt met all STGs and LTGs. He ate dys III item and drank H2O in session today without overt s/s aspiration. He has no overt s/s aspiration PNA today, and none reported. He appears safe presently with diet as tolerated and thin liquids.   Remaining deficits: None appear today.    Education / Equipment: Signs/symptoms aspiration PNA, HEP, late effects head/neck radiation, mucositis, xerostomia.   Plan: Patient agrees to discharge.  Patient goals were met. Patient is being discharged due to meeting the stated rehab goals.  ?????  He was instructed should he have more difficulty with swallowing to contact his PCP, or radiation oncologist, should she still be following pt.   Garald Balding, [place], [place]  05-04-14 1:54 PM  Phone: [phone] Fax: [phone]

## 2014-05-01 NOTE — Patient Instructions (Signed)
Complete exercises x2 day until 6 months from last day of radiation, then do exercises x3/week after that day.  Any problems arise with swallowing, contact your family doctor or your cancer doctors, if they are still following you with appointments.

## 2014-05-14 ENCOUNTER — Telehealth: Payer: Self-pay | Admitting: *Deleted

## 2014-05-14 NOTE — Telephone Encounter (Signed)
Entry error

## 2014-05-14 NOTE — Telephone Encounter (Signed)
I returned patient's VM.  He reported he has been experiencing persistent/notable neck lymphedema over the past several days; previously, fluid build-up was intermittent and resolved on its own.  He requests a Lymphedema PT referral with Biagio Borg, the therapist who conducted his initial evaluation.  Dr. Valere Dross and Patric Dykes, RN, informed.  Gayleen Orem, RN, BSN, [place] at [place] [phone]

## 2014-05-16 ENCOUNTER — Other Ambulatory Visit: Payer: Self-pay | Admitting: *Deleted

## 2014-05-16 ENCOUNTER — Telehealth: Payer: Self-pay | Admitting: *Deleted

## 2014-05-16 DIAGNOSIS — C099 Malignant neoplasm of tonsil, unspecified: Secondary | ICD-10-CM

## 2014-05-16 NOTE — Telephone Encounter (Signed)
Called patient to inform that referral for lymphedema PT has been made.  Gayleen Orem, RN, BSN, [place] [facility] [place] [phone]

## 2014-05-16 NOTE — Telephone Encounter (Signed)
CALLED PATIENT TO INFORM OF APPT. FOR LYMPHEDEMA ON 05-22-14- ARRIVING @ 12:50 PM @ [place], SPOKE WITH PATIENT AND HE IS AWARE OF THIS APPT.

## 2014-05-16 NOTE — Telephone Encounter (Signed)
Verbal order given for consultation and management of neck lymphedema by Physical Therapy Biagio Borg). RM

## 2014-05-22 ENCOUNTER — Ambulatory Visit: Payer: Medicare Other | Attending: Radiation Oncology | Admitting: Physical Therapy

## 2014-05-22 DIAGNOSIS — I890 Lymphedema, not elsewhere classified: Secondary | ICD-10-CM

## 2014-05-22 DIAGNOSIS — Z5189 Encounter for other specified aftercare: Secondary | ICD-10-CM | POA: Diagnosis present

## 2014-05-22 DIAGNOSIS — R293 Abnormal posture: Secondary | ICD-10-CM | POA: Insufficient documentation

## 2014-05-22 NOTE — Therapy (Signed)
[facility] [address] [place], [place], [address] Phone: [phone]   Fax:  [phone]  Physical Therapy Evaluation  Patient Details  Name: Matthew Moreno MRN: [id number] Date of Birth: 04/21/1946  Encounter Date: 05/22/2014      PT End of Session - 05/22/14 1351    Visit Number 1   Number of Visits 9   Date for PT Re-Evaluation 06/21/14   PT Start Time 1300   PT Stop Time 1345   PT Time Calculation (min) 45 min   Activity Tolerance Patient tolerated treatment well   Behavior During Therapy [facility] for tasks assessed/performed      Past Medical History  Diagnosis Date  . Nephrolithiasis     H/O multiple "kidney stones", Lithotripsies, J-Stents  . CAD (coronary artery disease) 2008  . Dyslipidemia   . Panic attacks   . Family history of heart disease   . Hypertension   . Statin intolerance   . 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 S/P radiation therapy 02/12/2014-04/02/2014    Left tonsil and bilateral neck / 70 Gy in 35 fractions to gross disease, 63 Gy in 35 fractions to high risk nodalechelons, and 56 Gy in 35 fractions to intermediate risk nodal echelons     Past Surgical History  Procedure Laterality Date  . Colonoscopy  2010    Dr. Christella Hartigan  . Biceps tendon repair Left   . Cardiac catheterization  06/19/2008    normal left main, Cfx with 3 OMs (50% osital narrowing in OM1), LAD densely calcified in the proximal half of vessel with eccentric narrowing in mid region (40-50% severity), diag 1 & 2 free of disease, RCA with less than 30% narrowing in midportion (Dr. Mervyn Skeeters. [place])    There were no vitals taken for this visit.  Visit Diagnosis:  Lymphedema      Subjective Assessment - 05/22/14 1304    Symptoms Here about neck lymphedema.  Feels it's gotten worse over the last couple weeks.   Pertinent History Left tonsil cancer with radiation treatment, which was completed 04/02/14.  Lost 10-12 lbs. with treatment.   HTN controlled with meds.  High triglycerides.  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   Currently in Pain? No/denies          [facility] PT Assessment - 05/22/14 0001    Assessment   Medical Diagnosis tonsil cancer   Precautions   Precautions Other (comment)   Precaution Comments cancer precautions   Restrictions   Weight Bearing Restrictions No   Balance Screen   Has the patient fallen in the past 6 months No   Has the patient had a decrease in activity level because of a fear of falling?  No   Is the patient reluctant to leave their home because of a fear of falling?  No   Home Environment   Living Enviornment Private residence   Living Arrangements Alone   Type of Home House   Home Layout One level   Prior Function   Level of Independence Independent with basic ADLs;Independent with homemaking with ambulation;Independent with gait   Vocation Retired   Observation/Other Assessments   Observations moderately firm tissue at anterior neck; some tightness to palpation at lateral aspects of neck   Posture/Postural Control   Posture/Postural Control Postural limitations   Postural Limitations Rounded Shoulders;Forward head   AROM   Overall AROM  Deficits   Overall AROM Comments Shoulders WFL; left elbow incomplete extension from an old biceps injury and surgery   Cervical Flexion WNL   Cervical Extension 50% loss   Cervical - Right Side Bend 90% loss   Cervical - Left Side Bend 75% loss   Cervical - Right Rotation 25% loss   Cervical - Left Rotation 25% loss           LYMPHEDEMA/ONCOLOGY QUESTIONNAIRE - 05/22/14 1309    Type   Cancer Type tonsil on left   Treatment   Active Radiation Treatment No   Past Radiation Treatment Yes   Date 04/02/14   Lymphedema Assessments   Lymphedema Assessments Head and Neck   Head and Neck   4 cm superior to sternal notch around neck 37.8 cm   6 cm superior to  sternal notch around neck 37.3 cm   8 cm superior to sternal notch around neck 38 cm   Other 10 cm. superior: 38.8 cm.   Other 12 cm. superior:  40.7 cm.      Therapist fashioned a pack of foam chips in stockinette and applied this with slight pressure to swollen area of neck using a short stretch bandage that had velcro sewn to it to hold it in place on top of his head.  Pt. reported it was comfortable.                 PT Education - 05/22/14 1350    Education provided Yes   Education Details wear foam chip pack with short stretch bandage for compression on neck at comfortable level of snugness 4 hours/day if possible, not during sleep   Person(s) Educated Patient   Methods Explanation;Demonstration   Comprehension Verbalized understanding                Long Term Clinic Goals - 05/22/14 1355    CC Long Term Goal  #1   Title Pt. will be independent in performing self-manual lymph drainage for his neck swelling.   Time 4   Period Weeks   Status New   CC Long Term Goal  #2   Title Pt. will be knowledgeable about compression garment options for his neck swelling, including where and how to obtain one if desired.   Time 4   Period Weeks   Status New   CC Long Term Goal  #3   Title Pt. will show at least 1 cm. decrease in circumference of neck at 10 cm. superior to sternal notch.   Baseline 38.8 cm.   Time 4   Period Weeks   Status New            Plan - 05/22/14 1351    Clinical Impression Statement Pt. with onset and worsening of neck lymhedema post radiation treatment for tonsil cancer will benefit from treatment and education about self-care.   Pt will benefit from skilled therapeutic intervention in order to improve on the following deficits Increased edema;Decreased knowledge of precautions;Decreased knowledge of use of DME   Rehab Potential Good   PT Frequency 2x / week   PT Duration 4 weeks   PT Treatment/Interventions Manual lymph  drainage;Compression bandaging;Patient/family education;ADLs/Self Care Home Management   PT Next Visit Plan Assess benefit of chip pack so far; begin manual lymph drainage.  Later, teach self-manual lymph drainage; show and discuss compression garment options.   Consulted and Agree with Plan of Care Patient          G-Codes -  05/22/14 1358    Functional Assessment Tool Used clinical judgement   Functional Limitation Other PT primary   Other PT Primary Current Status (W0981) At least 40 percent but less than 60 percent impaired, limited or restricted   Other PT Primary Goal Status (X9147) At least 20 percent but less than 40 percent impaired, limited or restricted       Problem List Patient Active Problem List   Diagnosis Date Noted  . Tonsil cancer 01/23/2014  . Statin intolerance 07/13/2012  . Hypertriglyceridemia 06/23/2011  . CAD (coronary artery disease) 06/23/2011  . Former smoker 06/23/2011  . DYSPEPSIA&OTHER [facility] DISORDERS FUNCTION STOMACH 03/29/2009    Eppie Barhorst 05/22/2014, 2:00 PM  [facility] [address] [place], [place], [address] Phone: [phone]   Fax:  [phone]  Micheline Maze, PT

## 2014-05-29 ENCOUNTER — Other Ambulatory Visit: Payer: Self-pay | Admitting: Internal Medicine

## 2014-05-29 ENCOUNTER — Ambulatory Visit: Payer: Medicare Other

## 2014-05-29 DIAGNOSIS — I890 Lymphedema, not elsewhere classified: Secondary | ICD-10-CM

## 2014-05-29 DIAGNOSIS — Z5189 Encounter for other specified aftercare: Secondary | ICD-10-CM | POA: Diagnosis not present

## 2014-05-29 DIAGNOSIS — R1310 Dysphagia, unspecified: Secondary | ICD-10-CM

## 2014-05-29 NOTE — Therapy (Signed)
[place], [place], [address] Phone: [phone]   Fax:  [phone]  Physical Therapy Treatment  Patient Details  Name: Matthew Moreno MRN: [id number] Date of Birth: 06-11-1945 Referring Provider:  Denita Lung, MD  Encounter Date: 05/29/2014      PT End of Session - 05/29/14 1104    Visit Number 2   Number of Visits 9   Date for PT Re-Evaluation 06/21/14   PT Start Time 1022   PT Stop Time 1103   PT Time Calculation (min) 41 min      Past Medical History  Diagnosis Date  . Nephrolithiasis     H/O multiple "kidney stones", Lithotripsies, J-Stents  . CAD (coronary artery disease) 2008  . Dyslipidemia   . Panic attacks   . Family history of heart disease   . Hypertension   . Statin intolerance   . 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 S/P radiation therapy 02/12/2014-04/02/2014    Left tonsil and bilateral neck / 70 Gy in 35 fractions to gross disease, 63 Gy in 35 fractions to high risk nodalechelons, and 56 Gy in 35 fractions to intermediate risk nodal echelons     Past Surgical History  Procedure Laterality Date  . Colonoscopy  2010    Dr. Ardis Hughs  . Biceps tendon repair Left   . Cardiac catheterization  06/19/2008    normal left main, Cfx with 3 OMs (50% osital narrowing in OM1), LAD densely calcified in the proximal half of vessel with eccentric narrowing in mid region (40-50% severity), diag 1 & 2 free of disease, RCA with less than 30% narrowing in midportion (Dr. Loni Muse. [place])    There were no vitals taken for this visit.  Visit Diagnosis:  Lymphedema  Dysphagia      Subjective Assessment - 05/29/14 1024    Symptoms Chip pack seems to work well, reduces the fluid for 2-3 hours. My neck and scapula pain was really bad after last visit, had to take pain pills.                    [facility] Adult PT Treatment/Exercise - 05/29/14 0001    Manual Therapy   Manual Lymphatic  Drainage (MLD) In Supine: Short neck, bil shoulder collectors, bil axillae nodes, superficial and deep abdominals, anterior upper quadrants, anterior, lateral, and posterior neck, submental and submandibular nodes, bil cheeks and forehead and pre- and retroauricular nodes all directing towards lateral neck.   Passive ROM Cervical PROM into bil side bends, rotation and manual traction during manual lymph drainage today, pt reported no pain with this                        [facility] Goals - 05/22/14 1355    CC Long Term Goal  #1   Title Pt. will be independent in performing self-manual lymph drainage for his neck swelling.   Time 4   Period Weeks   Status New   CC Long Term Goal  #2   Title Pt. will be knowledgeable about compression garment options for his neck swelling, including where and how to obtain one if desired.   Time 4   Period Weeks   Status New   CC Long Term Goal  #3   Title Pt. will show at least 1 cm. decrease in circumference of neck at 10 cm. superior to sternal notch.   Baseline  38.8 cm.   Time 4   Period Weeks   Status New            Plan - 05/29/14 1106    Clinical Impression Statement Pt had manual lymph drainage for first time today, instructed in this during sessiona and seemed to have a good rudimentary knowledge after treatment. Tolerated gentle cervical PROM well.   Pt will benefit from skilled therapeutic intervention in order to improve on the following deficits Increased edema;Decreased knowledge of precautions;Decreased knowledge of use of DME   Rehab Potential Good   PT Frequency 2x / week   PT Duration 4 weeks   PT Treatment/Interventions Manual lymph drainage;Compression bandaging;Patient/family education;ADLs/Self Care Home Management   PT Next Visit Plan Assess benefit of chip pack so far; begin manual lymph drainage.  Later, teach self-manual lymph drainage; show and discuss compression garment options.        Problem  List Patient Active Problem List   Diagnosis Date Noted  . Tonsil cancer 01/23/2014  . Statin intolerance 07/13/2012  . Hypertriglyceridemia 06/23/2011  . CAD (coronary artery disease) 06/23/2011  . Former smoker 06/23/2011  . DYSPEPSIA&OTHER [facility] DISORDERS FUNCTION STOMACH 03/29/2009    Collie Siad Ann,PTA 05/29/2014, 11:09 AM  [place], [place], [address] Phone: [phone]   Fax:  [phone]

## 2014-06-01 ENCOUNTER — Ambulatory Visit: Payer: Medicare Other | Admitting: Physical Therapy

## 2014-06-01 ENCOUNTER — Telehealth: Payer: Self-pay | Admitting: Internal Medicine

## 2014-06-01 DIAGNOSIS — Z5189 Encounter for other specified aftercare: Secondary | ICD-10-CM | POA: Diagnosis not present

## 2014-06-01 DIAGNOSIS — I890 Lymphedema, not elsewhere classified: Secondary | ICD-10-CM

## 2014-06-01 DIAGNOSIS — R1310 Dysphagia, unspecified: Secondary | ICD-10-CM

## 2014-06-01 NOTE — Therapy (Signed)
[place], [place], [address] Phone: [phone]   Fax:  [phone]  Physical Therapy Treatment  Patient Details  Name: Matthew Moreno MRN: [id number] Date of Birth: January 04, 1946 Referring Provider:  Denita Lung, MD  Encounter Date: 06/01/2014      PT End of Session - 06/01/14 1253    Visit Number 3   Number of Visits 9   Date for PT Re-Evaluation 06/21/14   PT Start Time 0935   PT Stop Time 1015   PT Time Calculation (min) 40 min      Past Medical History  Diagnosis Date  . Nephrolithiasis     H/O multiple "kidney stones", Lithotripsies, J-Stents  . CAD (coronary artery disease) 2008  . Dyslipidemia   . Panic attacks   . Family history of heart disease   . Hypertension   . Statin intolerance   . 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 S/P radiation therapy 02/12/2014-04/02/2014    Left tonsil and bilateral neck / 70 Gy in 35 fractions to gross disease, 63 Gy in 35 fractions to high risk nodalechelons, and 56 Gy in 35 fractions to intermediate risk nodal echelons     Past Surgical History  Procedure Laterality Date  . Colonoscopy  2010    Dr. Ardis Hughs  . Biceps tendon repair Left   . Cardiac catheterization  06/19/2008    normal left main, Cfx with 3 OMs (50% osital narrowing in OM1), LAD densely calcified in the proximal half of vessel with eccentric narrowing in mid region (40-50% severity), diag 1 & 2 free of disease, RCA with less than 30% narrowing in midportion (Dr. Loni Muse. [place])    There were no vitals taken for this visit.  Visit Diagnosis:  Lymphedema  Dysphagia      Subjective Assessment - 06/01/14 0942    Symptoms (p) pt states the he wears his chip pack 2-3 times a day for a couple hours at a time.    Currently in Pain? (p) No/denies          Manual lymph drainage In Supine: Short neck, bil shoulder collectors, bil axillae nodes, superficial and deep abdominals,  anterior upper quadrants, anterior, lateral, and posterior neck, submental and submandibular nodes, bil cheeks and forehead and pre- and retroauricular nodes all directing towards lateral neck.            [place] - 06/01/14 1259    CC Long Term Goal  #1   Title Pt. will be independent in performing self-manual lymph drainage for his neck swelling.   Time 4   Period Weeks   Status On-going   CC Long Term Goal  #2   Time 4   Period Weeks   Status On-going   CC Long Term Goal  #3   Title Pt. will show at least 1 cm. decrease in circumference of neck at 10 cm. superior to sternal notch.   Time 4   Period Weeks   Status On-going            Plan - 06/01/14 1254    Clinical Impression Statement Pt reports he is using his compression bandage chip pack at home and feels it is helping him. 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 Only parital active cervical rotation  today during treatment . 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  Pt cautioned to avoid too much neck extension and he said he plans to use a roller and is aware to protect his neck   PT Next Visit Plan continue with manual lymph drainage  Remeasure next        Problem List Patient Active Problem List   Diagnosis Date Noted  . Tonsil cancer 01/23/2014  . Statin intolerance 07/13/2012  . Hypertriglyceridemia 06/23/2011  . CAD (coronary artery disease) 06/23/2011  . Former smoker 06/23/2011  . DYSPEPSIA&OTHER [facility] DISORDERS FUNCTION STOMACH 03/29/2009   Donato Heinz. Owens Shark, PT   06/01/2014, 1:00 PM  [place], [place], [address] Phone: [phone]   Fax:  [phone]

## 2014-06-01 NOTE — Telephone Encounter (Signed)
ok 

## 2014-06-01 NOTE — Telephone Encounter (Signed)
Pt called in stating that his insurance will no longer be covering his Fenofibrate so after he is done with his last prescription he will not be taking it anymore. He just wanted Dr. Debara Pickett to be informed of this.   Thanks

## 2014-06-01 NOTE — Telephone Encounter (Signed)
Message routed to Dr. Debara Pickett to review and advise on alternatives as appropriate.

## 2014-06-04 ENCOUNTER — Ambulatory Visit: Payer: Medicare Other

## 2014-06-04 DIAGNOSIS — I890 Lymphedema, not elsewhere classified: Secondary | ICD-10-CM

## 2014-06-04 DIAGNOSIS — R1310 Dysphagia, unspecified: Secondary | ICD-10-CM

## 2014-06-04 DIAGNOSIS — Z5189 Encounter for other specified aftercare: Secondary | ICD-10-CM | POA: Diagnosis not present

## 2014-06-04 NOTE — Therapy (Signed)
[place], [place], [address] Phone: [phone]   Fax:  [phone]  Physical Therapy Treatment  Patient Details  Name: Matthew Moreno MRN: [id number] Date of Birth: 1945/08/08 Referring Provider:  Denita Lung, MD  Encounter Date: 06/04/2014      PT End of Session - 06/04/14 1152    Visit Number 4   Number of Visits 9   Date for PT Re-Evaluation 06/21/14   PT Start Time 1109   PT Stop Time 1150   PT Time Calculation (min) 41 min      Past Medical History  Diagnosis Date  . Nephrolithiasis     H/O multiple "kidney stones", Lithotripsies, J-Stents  . CAD (coronary artery disease) 2008  . Dyslipidemia   . Panic attacks   . Family history of heart disease   . Hypertension   . Statin intolerance   . 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 S/P radiation therapy 02/12/2014-04/02/2014    Left tonsil and bilateral neck / 70 Gy in 35 fractions to gross disease, 63 Gy in 35 fractions to high risk nodalechelons, and 56 Gy in 35 fractions to intermediate risk nodal echelons     Past Surgical History  Procedure Laterality Date  . Colonoscopy  2010    Dr. Ardis Hughs  . Biceps tendon repair Left   . Cardiac catheterization  06/19/2008    normal left main, Cfx with 3 OMs (50% osital narrowing in OM1), LAD densely calcified in the proximal half of vessel with eccentric narrowing in mid region (40-50% severity), diag 1 & 2 free of disease, RCA with less than 30% narrowing in midportion (Dr. Loni Muse. Little)    There were no vitals taken for this visit.  Visit Diagnosis:  Lymphedema  Dysphagia      Subjective Assessment - 06/04/14 1109    Symptoms Been using the chip pack 3x/day, it really helps reduce my fluid. Been trying to start massaging myself, though want to learn the sequence better. The passive stretching you did on my last visit with you got the soreness out.                    [facility]  Adult PT Treatment/Exercise - 06/04/14 0001    Manual Therapy   Manual Lymphatic Drainage (MLD) In supine: Short and long neck, bil supraclavicular and shoulder collectors and bil axillae nodes, superficial and anterior nodes, and anterior upper quadrants, anterior, lateral and posterior neck, submental and submandibular nodes, bil masseters, pre-a nd retroauricular nodes and forehead all directing towards lateral neck.   Passive ROM Cervical PROM during manual lymph drainage into bil side bends, rotations and manual traction to tolerance.                        [place] - 06/01/14 1259    CC Long Term Goal  #1   Title Pt. will be independent in performing self-manual lymph drainage for his neck swelling.   Time 4   Period Weeks   Status On-going   CC Long Term Goal  #2   Time 4   Period Weeks   Status On-going   CC Long Term Goal  #3   Title Pt. will show at least 1 cm. decrease in circumference of neck at 10 cm. superior to sternal notch.   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 Continues to use the chip pack 3x/day and continues to tolerate passive neck stretching without increase pain.   Pt will benefit from skilled therapeutic intervention in order to improve on the following deficits Increased edema;Decreased knowledge of precautions;Decreased knowledge of use of DME   Rehab Potential Good   PT Frequency 2x / week   PT Duration 4 weeks   PT Treatment/Interventions Manual lymph drainage;Compression bandaging;Patient/family education;ADLs/Self Care Home Management   PT Next Visit Plan continue with manual lymph drainage  Remeasure next   Consulted and Agree with Plan of Care Patient        Problem List Patient Active Problem List   Diagnosis Date Noted   . Tonsil cancer 01/23/2014  . Statin intolerance 07/13/2012  . Hypertriglyceridemia 06/23/2011  . CAD (coronary artery disease) 06/23/2011  . Former smoker 06/23/2011  . DYSPEPSIA&OTHER [facility] DISORDERS FUNCTION STOMACH 03/29/2009    Otelia Limes, PTA 06/04/2014, 11:55 AM  [place], [place], [address] Phone: [phone]   Fax:  [phone]

## 2014-06-06 ENCOUNTER — Ambulatory Visit: Payer: Medicare Other

## 2014-06-06 DIAGNOSIS — Z5189 Encounter for other specified aftercare: Secondary | ICD-10-CM | POA: Diagnosis not present

## 2014-06-06 DIAGNOSIS — I890 Lymphedema, not elsewhere classified: Secondary | ICD-10-CM

## 2014-06-06 DIAGNOSIS — R1310 Dysphagia, unspecified: Secondary | ICD-10-CM

## 2014-06-06 NOTE — Therapy (Signed)
[place], [place], [address] Phone: [phone]   Fax:  [phone]  Physical Therapy Treatment  Patient Details  Name: Matthew Moreno MRN: [id number] Date of Birth: 11-04-1945 Referring Provider:  Denita Lung, MD  Encounter Date: 06/06/2014      PT End of Session - 06/06/14 1018    Visit Number 5   Number of Visits 9   Date for PT Re-Evaluation 06/21/14   PT Start Time 0936   PT Stop Time 1016   PT Time Calculation (min) 40 min      Past Medical History  Diagnosis Date  . Nephrolithiasis     H/O multiple "kidney stones", Lithotripsies, J-Stents  . CAD (coronary artery disease) 2008  . Dyslipidemia   . Panic attacks   . Family history of heart disease   . Hypertension   . Statin intolerance   . 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 S/P radiation therapy 02/12/2014-04/02/2014    Left tonsil and bilateral neck / 70 Gy in 35 fractions to gross disease, 63 Gy in 35 fractions to high risk nodalechelons, and 56 Gy in 35 fractions to intermediate risk nodal echelons     Past Surgical History  Procedure Laterality Date  . Colonoscopy  2010    Dr. Ardis Hughs  . Biceps tendon repair Left   . Cardiac catheterization  06/19/2008    normal left main, Cfx with 3 OMs (50% osital narrowing in OM1), LAD densely calcified in the proximal half of vessel with eccentric narrowing in mid region (40-50% severity), diag 1 & 2 free of disease, RCA with less than 30% narrowing in midportion (Dr. Loni Muse. Little)    There were no vitals taken for this visit.  Visit Diagnosis:  No diagnosis found.      Subjective Assessment - 06/06/14 0937    Symptoms Neck feels great, swelling feels about the same as when we started. Some mornings are worse than others.             LYMPHEDEMA/ONCOLOGY QUESTIONNAIRE - 06/06/14 2694    Head and Neck   4 cm superior to sternal notch around neck 37.7 cm   6 cm superior to  sternal notch around neck 37.4 cm   8 cm superior to sternal notch around neck 38.5 cm   Other 10 cm superior 39.5 cm   Other 12 cm. superior:  41.3 cm.               [place] Adult PT Treatment/Exercise - 06/06/14 0001    Manual Therapy   Manual Lymphatic Drainage (MLD) In Supine: Short and long neck, bil shoulder collectors, and bil axillae nodes, anterior upper quadrants, superficial and deep abdominals, anterior, latera nd posterior neck, submental and submandibular nodes, bil massters, pre- and retroauricular nodes, and forehead area directing towards lateral neck.   Passive ROM Cervical PROM into bil side bends, rotation and manual traction throughout treatment today.                        [place] - 06/06/14 0945    CC Long Term Goal  #1   Title Pt. will be independent in performing self-manual lymph drainage for his neck swelling.   Status On-going   CC Long Term Goal  #2   Title Pt. will be knowledgeable about compression garment options for his neck swelling, including where and how to obtain one  if desired.   Status On-going   CC Long Term Goal  #3   Title Pt. will show at least 1 cm. decrease in circumference of neck at 10 cm. superior to sternal notch.   Baseline 39.5 cm 06/06/14, but measurements taken in morning and before pt could wear chip pack today.   Status On-going            Plan - 06/06/14 1018    Clinical Impression Statement Increase in some circumference measurements taken today, however this is time of day when pts swelling is worse and he has not gotten to wear his chip pack yet.    Pt will benefit from skilled therapeutic intervention in order to improve on the following deficits Increased edema;Decreased knowledge of precautions;Decreased knowledge of use of DME   Rehab Potential Good   PT Frequency 2x / week   PT Duration 4 weeks   PT Treatment/Interventions Manual lymph drainage;Compression bandaging;Patient/family  education;ADLs/Self Care Home Management   PT Next Visit Plan continue with manual lymph drainage          Problem List Patient Active Problem List   Diagnosis Date Noted  . Tonsil cancer 01/23/2014  . Statin intolerance 07/13/2012  . Hypertriglyceridemia 06/23/2011  . CAD (coronary artery disease) 06/23/2011  . Former smoker 06/23/2011  . DYSPEPSIA&OTHER [facility] DISORDERS FUNCTION STOMACH 03/29/2009    Otelia Limes, PTA 06/06/2014, 10:22 AM  [place], [place], [address] Phone: [phone]   Fax:  [phone]

## 2014-06-11 ENCOUNTER — Ambulatory Visit: Payer: Medicare Other | Admitting: Physical Therapy

## 2014-06-11 DIAGNOSIS — I890 Lymphedema, not elsewhere classified: Secondary | ICD-10-CM

## 2014-06-11 DIAGNOSIS — Z5189 Encounter for other specified aftercare: Secondary | ICD-10-CM | POA: Diagnosis not present

## 2014-06-11 NOTE — Therapy (Signed)
[place], [place], [address] Phone: [phone]   Fax:  [phone]  Physical Therapy Treatment  Patient Details  Name: Matthew Moreno MRN: [id number] Date of Birth: 23-Jun-1945 Referring Provider:  Denita Lung, MD  Encounter Date: 06/11/2014      PT End of Session - 06/11/14 1711    Visit Number 6   Number of Visits 9   Date for PT Re-Evaluation 06/21/14   PT Start Time 1301   PT Stop Time 1345   PT Time Calculation (min) 44 min   Activity Tolerance Patient tolerated treatment well      Past Medical History  Diagnosis Date  . Nephrolithiasis     H/O multiple "kidney stones", Lithotripsies, J-Stents  . CAD (coronary artery disease) 2008  . Dyslipidemia   . Panic attacks   . Family history of heart disease   . Hypertension   . Statin intolerance   . 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 S/P radiation therapy 02/12/2014-04/02/2014    Left tonsil and bilateral neck / 70 Gy in 35 fractions to gross disease, 63 Gy in 35 fractions to high risk nodalechelons, and 56 Gy in 35 fractions to intermediate risk nodal echelons     Past Surgical History  Procedure Laterality Date  . Colonoscopy  2010    Dr. Ardis Hughs  . Biceps tendon repair Left   . Cardiac catheterization  06/19/2008    normal left main, Cfx with 3 OMs (50% osital narrowing in OM1), LAD densely calcified in the proximal half of vessel with eccentric narrowing in mid region (40-50% severity), diag 1 & 2 free of disease, RCA with less than 30% narrowing in midportion (Dr. Loni Muse. Little)    There were no vitals taken for this visit.  Visit Diagnosis:  Lymphedema      Subjective Assessment - 06/11/14 1302    Symptoms Can't tell if therapy is making a difference.  The foam chip pack does make a difference in reducing the neck swelling.   Currently in Pain? No/denies                    [facility] Adult PT Treatment/Exercise -  06/11/14 0001    Manual Therapy   Manual Therapy Myofascial release   Myofascial Release Cervical occipito-atlantal release; gentle manual cervical traction and small oscillation neck rotation.                PT Education - 06/11/14 1708    Education provided Yes   Education Details Self manual lymph drainage in sitting in front of a mirror for neck and face.   Person(s) Educated Patient   Methods Explanation;Demonstration;Tactile cues;Verbal cues;Handout   Comprehension Verbalized understanding;Returned demonstration      Also:  Showed patient pictures of various types of head/neck compression garments and discussed these with him; currently he chooses simply to continue to use the foam chip pack with short stretch bandage that he was given here.  Loaned patient self-manual lymph drainage DVD to reinforce what he learned here today.        [facility] Goals - 06/11/14 1714    CC Long Term Goal  #1   Status On-going   CC Long Term Goal  #2   Status Achieved            Plan - 06/11/14 1711    Clinical Impression Statement Neck tissue looks and feels soft, patient having  worn his chip pack before coming to therapy today.  Pt. is unsure how much therapy is helping, but says that using foam chip pack helps.   PT Treatment/Interventions Manual lymph drainage;Compression bandaging;Patient/family education;ADLs/Self Care Home Management   PT Next Visit Plan Remeasure; review self-manual lymph drainage as needed; continue that; continue neck PROM/HEP   Consulted and Agree with Plan of Care Patient        Problem List Patient Active Problem List   Diagnosis Date Noted  . Tonsil cancer 01/23/2014  . Statin intolerance 07/13/2012  . Hypertriglyceridemia 06/23/2011  . CAD (coronary artery disease) 06/23/2011  . Former smoker 06/23/2011  . DYSPEPSIA&OTHER [facility] DISORDERS FUNCTION STOMACH 03/29/2009    SALIS[place],DONNA 06/11/2014, 5:15 PM  [place], [place], [address] Phone: [phone]   Fax:  [phone]  Serafina Royals, [place]

## 2014-06-14 ENCOUNTER — Ambulatory Visit: Payer: Medicare Other

## 2014-06-14 DIAGNOSIS — Z5189 Encounter for other specified aftercare: Secondary | ICD-10-CM | POA: Diagnosis not present

## 2014-06-14 DIAGNOSIS — R1310 Dysphagia, unspecified: Secondary | ICD-10-CM

## 2014-06-14 DIAGNOSIS — I890 Lymphedema, not elsewhere classified: Secondary | ICD-10-CM

## 2014-06-14 NOTE — Therapy (Signed)
[place], [place], [address] Phone: [phone]   Fax:  [phone]  Physical Therapy Treatment  Patient Details  Name: Matthew Moreno MRN: [id number] Date of Birth: [birth year] Referring Provider:  Denita Lung, MD  Encounter Date: 06/14/2014      PT End of Session - 06/14/14 1439    Visit Number 8   Number of Visits 9   Date for PT Re-Evaluation 06/21/14   PT Start Time 1351   PT Stop Time 1431   PT Time Calculation (min) 40 min      Past Medical History  Diagnosis Date  . Nephrolithiasis     H/O multiple "kidney stones", Lithotripsies, J-Stents  . CAD (coronary artery disease) 2008  . Dyslipidemia   . Panic attacks   . Family history of heart disease   . Hypertension   . Statin intolerance   . 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 S/P radiation therapy 02/12/2014-04/02/2014    Left tonsil and bilateral neck / 70 Gy in 35 fractions to gross disease, 63 Gy in 35 fractions to high risk nodalechelons, and 56 Gy in 35 fractions to intermediate risk nodal echelons     Past Surgical History  Procedure Laterality Date  . Colonoscopy  2010    Dr. Ardis Hughs  . Biceps tendon repair Left   . Cardiac catheterization  06/19/2008    normal left main, Cfx with 3 OMs (50% osital narrowing in OM1), LAD densely calcified in the proximal half of vessel with eccentric narrowing in mid region (40-50% severity), diag 1 & 2 free of disease, RCA with less than 30% narrowing in midportion (Dr. Loni Muse. Little)    There were no vitals taken for this visit.  Visit Diagnosis:  Lymphedema  Dysphagia      Subjective Assessment - 06/14/14 1352    Symptoms Nothing new to report.                    [facility] Adult PT Treatment/Exercise - 06/14/14 0001    Manual Therapy   Manual Lymphatic Drainage (MLD) In Supine: Short and long neck, bil shoulder collectors and bil axillae nodes, superficial and deep  abdominals, bil anterior upper quadrants, anterior, lateral, and posterior neck, submental and submandibular nodes, bil masseters, pre- and retroauricular nodes, and forehead all directing towards lateral neck.   Passive ROM Cervical PROM into bil side bends, rotation , and manual traction throughout treatment today.                        [facility] Goals - 06/11/14 1714    CC Long Term Goal  #1   Status On-going   CC Long Term Goal  #2   Status Achieved            Plan - 06/14/14 1440    Clinical Impression Statement Pt has increased cervical PROM since start of care and reports that he would like to continue therapy as he reports realizing that his ROM is much better and fluid is softer.   Pt will benefit from skilled therapeutic intervention in order to improve on the following deficits Increased edema;Decreased knowledge of precautions;Decreased knowledge of use of DME   Rehab Potential Good   PT Frequency 2x / week   PT Duration 4 weeks   PT Treatment/Interventions Manual lymph drainage;Compression bandaging;Patient/family education;ADLs/Self Care Home Management   PT Next Visit Plan Remeasure;  review self-manual lymph drainage as needed; continue that; continue neck PROM/HEP        Problem List Patient Active Problem List   Diagnosis Date Noted  . Tonsil cancer 01/23/2014  . Statin intolerance 07/13/2012  . Hypertriglyceridemia 06/23/2011  . CAD (coronary artery disease) 06/23/2011  . Former smoker 06/23/2011  . DYSPEPSIA&OTHER [facility] DISORDERS FUNCTION STOMACH 03/29/2009    Collie Siad Ann,PTA 06/14/2014, 2:42 PM  [place], [place], [address] Phone: [phone]   Fax:  [phone]

## 2014-06-14 NOTE — Therapy (Signed)
[place], [place], [address] Phone: [phone]   Fax:  [phone]  Physical Therapy Treatment  Patient Details  Name: Matthew Moreno MRN: [id number] Date of Birth: 1946/05/08 Referring Provider:  Denita Lung, MD  Encounter Date: 06/14/2014      PT End of Session - 06/14/14 1439    Visit Number 8   Number of Visits 9   Date for PT Re-Evaluation 06/21/14   PT Start Time 1351   PT Stop Time 1431   PT Time Calculation (min) 40 min      Past Medical History  Diagnosis Date  . Nephrolithiasis     H/O multiple "kidney stones", Lithotripsies, J-Stents  . CAD (coronary artery disease) 2008  . Dyslipidemia   . Panic attacks   . Family history of heart disease   . Hypertension   . Statin intolerance   . 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 S/P radiation therapy 02/12/2014-04/02/2014    Left tonsil and bilateral neck / 70 Gy in 35 fractions to gross disease, 63 Gy in 35 fractions to high risk nodalechelons, and 56 Gy in 35 fractions to intermediate risk nodal echelons     Past Surgical History  Procedure Laterality Date  . Colonoscopy  2010    Dr. Ardis Hughs  . Biceps tendon repair Left   . Cardiac catheterization  06/19/2008    normal left main, Cfx with 3 OMs (50% osital narrowing in OM1), LAD densely calcified in the proximal half of vessel with eccentric narrowing in mid region (40-50% severity), diag 1 & 2 free of disease, RCA with less than 30% narrowing in midportion (Dr. Loni Muse. Little)    There were no vitals taken for this visit.  Visit Diagnosis:  Lymphedema  Dysphagia      Subjective Assessment - 06/14/14 1352    Symptoms Nothing new to report except I am now interested in getting a compression garment for my face. Will go to [facility] and see what they have like Butch Penny mentioned awhile ago.                    [facility] Adult PT Treatment/Exercise - 06/14/14 0001    Manual Therapy   Manual Lymphatic Drainage (MLD) In Supine: Short and long neck, bil shoulder collectors and bil axillae nodes, superficial and deep abdominals, bil anterior upper quadrants, anterior, lateral, and posterior neck, submental and submandibular nodes, bil masseters, pre- and retroauricular nodes, and forehead all directing towards lateral neck.   Passive ROM Cervical PROM into bil side bends, rotation , and manual traction throughout treatment today.                        [facility] Goals - 06/11/14 1714    CC Long Term Goal  #1   Status On-going   CC Long Term Goal  #2   Status Achieved            Plan - 06/14/14 1440    Clinical Impression Statement Pt has increased cervical PROM since start of care and reports that he would like to continue therapy as he reports realizing that his ROM is much better and fluid is softer.   Pt will benefit from skilled therapeutic intervention in order to improve on the following deficits Increased edema;Decreased knowledge of precautions;Decreased knowledge of use of DME   Rehab Potential Good   PT Frequency 2x /  week   PT Duration 4 weeks   PT Treatment/Interventions Manual lymph drainage;Compression bandaging;Patient/family education;ADLs/Self Care Home Management   PT Next Visit Plan Remeasure; review self-manual lymph drainage as needed; continue that; continue neck PROM/HEP        Problem List Patient Active Problem List   Diagnosis Date Noted  . Tonsil cancer 01/23/2014  . Statin intolerance 07/13/2012  . Hypertriglyceridemia 06/23/2011  . CAD (coronary artery disease) 06/23/2011  . Former smoker 06/23/2011  . DYSPEPSIA&OTHER [facility] DISORDERS FUNCTION STOMACH 03/29/2009    Collie Siad Ann,PTA 06/14/2014, 2:52 PM  [place], [place], [address] Phone: [phone]   Fax:  [phone]

## 2014-06-18 ENCOUNTER — Ambulatory Visit: Payer: Medicare Other | Attending: Radiation Oncology

## 2014-06-18 DIAGNOSIS — Z5189 Encounter for other specified aftercare: Secondary | ICD-10-CM | POA: Insufficient documentation

## 2014-06-18 DIAGNOSIS — R1310 Dysphagia, unspecified: Secondary | ICD-10-CM

## 2014-06-18 DIAGNOSIS — R293 Abnormal posture: Secondary | ICD-10-CM | POA: Insufficient documentation

## 2014-06-18 DIAGNOSIS — I890 Lymphedema, not elsewhere classified: Secondary | ICD-10-CM

## 2014-06-18 NOTE — Therapy (Signed)
[place], [place], [address] Phone: [phone]   Fax:  [phone]  Physical Therapy Treatment  Patient Details  Name: Matthew Moreno MRN: [id number] Date of Birth: 02/02/1946 Referring Provider:  Denita Lung, MD  Encounter Date: 06/18/2014      PT End of Session - 06/18/14 1156    Visit Number 9   Number of Visits 9   Date for PT Re-Evaluation 06/21/14   PT Start Time 1108   PT Stop Time 1149   PT Time Calculation (min) 41 min      Past Medical History  Diagnosis Date  . Nephrolithiasis     H/O multiple "kidney stones", Lithotripsies, J-Stents  . CAD (coronary artery disease) 2008  . Dyslipidemia   . Panic attacks   . Family history of heart disease   . Hypertension   . Statin intolerance   . 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 S/P radiation therapy 02/12/2014-04/02/2014    Left tonsil and bilateral neck / 70 Gy in 35 fractions to gross disease, 63 Gy in 35 fractions to high risk nodalechelons, and 56 Gy in 35 fractions to intermediate risk nodal echelons     Past Surgical History  Procedure Laterality Date  . Colonoscopy  2010    Dr. Ardis Hughs  . Biceps tendon repair Left   . Cardiac catheterization  06/19/2008    normal left main, Cfx with 3 OMs (50% osital narrowing in OM1), LAD densely calcified in the proximal half of vessel with eccentric narrowing in mid region (40-50% severity), diag 1 & 2 free of disease, RCA with less than 30% narrowing in midportion (Dr. Loni Muse. Little)    There were no vitals taken for this visit.  Visit Diagnosis:  Lymphedema  Dysphagia      Subjective Assessment - 06/18/14 1109    Symptoms Went to [facility] but they didnt have any garments for me to see. Okay for you to send my info to [facility] to get measured.    Currently in Pain? No/denies                    [facility] Adult PT Treatment/Exercise - 06/18/14 0001    Manual  Therapy   Manual Lymphatic Drainage (MLD) In Supine: Short and long neck, bil shoulder collectors, bil axillae nodes, anterior upper quadrants, superficial and deep abdominals, anterior, lateral and posterior neck, submental and submandibular nodes, bil masseters, pre- and retroauricular nodes and forehead all directing towards lateral neck.   Passive ROM Cervical PROM into bil side bends, rotation and manual traction throughout treatment                        [place] - 06/11/14 1714    CC Long Term Goal  #1   Status On-going   CC Long Term Goal  #2   Status Achieved            Plan - 06/18/14 1157    Clinical Impression Statement Pt would like to continue therapy for another month to see how much more improvement with ROM and decrease swelling he can get. Continues to notice swelling worse in the morning, but some mornings better than others now.   Pt will benefit from skilled therapeutic intervention in order to improve on the following deficits Increased edema;Decreased knowledge of precautions;Decreased knowledge of use of DME   Rehab Potential Good  PT Frequency 2x / week   PT Duration 4 weeks   PT Treatment/Interventions Manual lymph drainage;Compression bandaging;Patient/family education;ADLs/Self Care Home Management   PT Next Visit Plan Renewal needed next visit. Will fax info to [facility] today. Remeasure; review self-manual lymph drainage as needed; continue that; continue neck PROM/HEP.   Consulted and Agree with Plan of Care Patient        Problem List Patient Active Problem List   Diagnosis Date Noted  . Tonsil cancer 01/23/2014  . Statin intolerance 07/13/2012  . Hypertriglyceridemia 06/23/2011  . CAD (coronary artery disease) 06/23/2011  . Former smoker 06/23/2011  . DYSPEPSIA&OTHER [facility] DISORDERS FUNCTION STOMACH 03/29/2009    Otelia Limes, PTA 06/18/2014, 12:02 PM  [place], [place], [address] Phone: [phone]   Fax:  [phone]

## 2014-06-21 ENCOUNTER — Ambulatory Visit: Payer: Medicare Other

## 2014-06-21 DIAGNOSIS — Z5189 Encounter for other specified aftercare: Secondary | ICD-10-CM | POA: Diagnosis not present

## 2014-06-21 DIAGNOSIS — R1310 Dysphagia, unspecified: Secondary | ICD-10-CM

## 2014-06-21 DIAGNOSIS — I890 Lymphedema, not elsewhere classified: Secondary | ICD-10-CM

## 2014-06-21 NOTE — Therapy (Addendum)
[place], [place], [address] Phone: [phone]   Fax:  [phone]  Physical Therapy Treatment  Patient Details  Name: Matthew Moreno MRN: [id number] Date of Birth: 1945-12-14 Referring Provider:  Denita Lung, MD  Encounter Date: 06/21/2014      PT End of Session - 06/22/14 1246    Number of Visits 8   Date for PT Re-Evaluation 07/20/14      Past Medical History  Diagnosis Date  . Nephrolithiasis     H/O multiple "kidney stones", Lithotripsies, J-Stents  . CAD (coronary artery disease) 2008  . Dyslipidemia   . Panic attacks   . Family history of heart disease   . Hypertension   . Statin intolerance   . 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 S/P radiation therapy 02/12/2014-04/02/2014    Left tonsil and bilateral neck / 70 Gy in 35 fractions to gross disease, 63 Gy in 35 fractions to high risk nodalechelons, and 56 Gy in 35 fractions to intermediate risk nodal echelons     Past Surgical History  Procedure Laterality Date  . Colonoscopy  2010    Dr. Ardis Hughs  . Biceps tendon repair Left   . Cardiac catheterization  06/19/2008    normal left main, Cfx with 3 OMs (50% osital narrowing in OM1), LAD densely calcified in the proximal half of vessel with eccentric narrowing in mid region (40-50% severity), diag 1 & 2 free of disease, RCA with less than 30% narrowing in midportion (Dr. Loni Muse. Little)    There were no vitals taken for this visit.  Visit Diagnosis:  Lymphedema  Dysphagia      Subjective Assessment - 06/21/14 1442    Symptoms Got measured for my garment yesterday with Rexford Maus and she said she is going to try to get it more affordable for me.                                     Long Term Clinic Goals - 06/22/14 1249    CC Long Term Goal  #1   Title Pt. will be independent in performing self-manual lymph drainage for his neck swelling.   Time 4    Period Weeks   Status On-going   CC Long Term Goal  #2   Title Pt. will be knowledgeable about compression garment options for his neck swelling, including where and how to obtain one if desired.   Status Achieved   CC Long Term Goal  #3   Title Pt. will show at least 1 cm. decrease in circumference of neck at 10 cm. superior to sternal notch.   Time 4   Period Weeks   Status On-going            Plan - 06/21/14 1520    Clinical Impression Statement Wearing chip pack 3x/day and wearing before bed and doing massage before bed has made his morning swelling better and can swallow easier upon waking. Feels like taste has fully returned as well.    Pt will benefit from skilled therapeutic intervention in order to improve on the following deficits Increased edema;Decreased knowledge of precautions;Decreased knowledge of use of DME   Rehab Potential Good   PT Frequency 2x / week   PT Duration 4 weeks   PT Treatment/Interventions Manual lymph drainage;Compression bandaging;Patient/family education;ADLs/Self Care Home Management   PT Next Visit  Plan Renewal needed next visit. Remeasure; review self-manual lymph drainage as needed; continue that; continue neck PROM/HEP.   Consulted and Agree with Plan of Care Patient          G-Codes - July 04, 2014 1245    Functional Assessment Tool Used clinical judgement   Functional Limitation Other PT primary   Other PT Primary Current Status (Y3709) At least 40 percent but less than 60 percent impaired, limited or restricted   Other PT Primary Goal Status (U4383) At least 20 percent but less than 40 percent impaired, limited or restricted      Problem List Patient Active Problem List   Diagnosis Date Noted  . Tonsil cancer 01/23/2014  . Statin intolerance 07/13/2012  . Hypertriglyceridemia 07-05-2011  . CAD (coronary artery disease) 07-05-2011  . Former smoker Jul 05, 2011  . DYSPEPSIA&OTHER [facility] DISORDERS FUNCTION STOMACH 03/29/2009     Norwood Levo, PTA July 04, 2014, 12:50 PM  [place], [place], [address] Phone: [phone]   Fax:  [phone]

## 2014-06-22 DIAGNOSIS — Z5189 Encounter for other specified aftercare: Secondary | ICD-10-CM | POA: Diagnosis not present

## 2014-06-22 NOTE — Addendum Note (Signed)
Addended by: Kipp Laurence on: 06/22/2014 12:53 PM   Modules accepted: Orders

## 2014-06-25 ENCOUNTER — Ambulatory Visit: Payer: Medicare Other | Admitting: Physical Therapy

## 2014-06-25 ENCOUNTER — Encounter: Payer: Self-pay | Admitting: Physical Therapy

## 2014-06-25 DIAGNOSIS — Z5189 Encounter for other specified aftercare: Secondary | ICD-10-CM | POA: Diagnosis not present

## 2014-06-25 DIAGNOSIS — R29898 Other symptoms and signs involving the musculoskeletal system: Secondary | ICD-10-CM

## 2014-06-25 DIAGNOSIS — I890 Lymphedema, not elsewhere classified: Secondary | ICD-10-CM

## 2014-06-25 NOTE — Therapy (Signed)
[place], [place], [address] Phone: [phone]   Fax:  [phone]  Physical Therapy Treatment  Patient Details  Name: Matthew Moreno MRN: [id number] Date of Birth: Jan 30, 1946 Referring Provider:  Denita Lung, MD  Encounter Date: 06/25/2014      PT End of Session - 06/25/14 1500    Visit Number 11   Number of Visits 12   Date for PT Re-Evaluation 07/20/14   PT Start Time 9323   PT Stop Time 1430   PT Time Calculation (min) 45 min   Activity Tolerance Patient tolerated treatment well   Behavior During Therapy [facility] for tasks assessed/performed      Past Medical History  Diagnosis Date  . Nephrolithiasis     H/O multiple "kidney stones", Lithotripsies, J-Stents  . CAD (coronary artery disease) 2008  . Dyslipidemia   . Panic attacks   . Family history of heart disease   . Hypertension   . Statin intolerance   . 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 S/P radiation therapy 02/12/2014-04/02/2014    Left tonsil and bilateral neck / 70 Gy in 35 fractions to gross disease, 63 Gy in 35 fractions to high risk nodalechelons, and 56 Gy in 35 fractions to intermediate risk nodal echelons     Past Surgical History  Procedure Laterality Date  . Colonoscopy  2010    Dr. Ardis Hughs  . Biceps tendon repair Left   . Cardiac catheterization  06/19/2008    normal left main, Cfx with 3 OMs (50% osital narrowing in OM1), LAD densely calcified in the proximal half of vessel with eccentric narrowing in mid region (40-50% severity), diag 1 & 2 free of disease, RCA with less than 30% narrowing in midportion (Dr. Loni Muse. Little)    There were no vitals taken for this visit.  Visit Diagnosis:  Lymphedema  Decreased range of motion of neck      Subjective Assessment - 06/25/14 1351    Symptoms Feel good without complaints of pain.  I'm not sure if therapy is helping.  I got fitted for my head garment and I'm waiting to  hear from her about insurance coverage.             [facility] Adult PT Treatment/Exercise - 06/25/14 0001    Manual Therapy   Manual Lymphatic Drainage (MLD) In Supine: Short neck, bil shoulder collectors, bil axillary nodes, bilateral shoulder collectors, anterior upper quadrants, anterior, lateral, and posterior neck, submental and submandibular nodes, bil cheeks and forehead and pre- and retroauricular nodes all directing towards lateral neck.   Passive ROM Cervical PROM into bil side bends and bilateral rotation  in supine to patient's tolerance to increase mobility.   Other Manual Therapy Issued another chip pack compression garment.  This one ties behind his neck and he complained that the velcro previously used stuck to his hair.  Patient reported comfort while wearing.           Long Term Clinic Goals - 06/25/14 1505    CC Long Term Goal  #1   Time 4   Period Weeks   Status On-going   CC Long Term Goal  #2   Time 4   Period Weeks   Status Achieved   CC Long Term Goal  #3   Time 4   Period Weeks   Status On-going            Plan - 06/25/14 1501  Clinical Impression Statement Patient reports he is pleased with how well he is doing.  He reports he has returned to all prior activities and his only complaint is swelling in his anterior neck which he reports is much better controlled with his chip pack.  He reports being unsure about the effectiveness of manual lymph drainage and feels his chip pack helps more than anything else.  He is walking 30 minutes 3 times each day with his dog and is staying otherwise very busy with gardening and other wood working activities.  He reports being happy with how he is doing and feels he can continue doing self massage and wearing the chip pack on his own after this week.  He agrees to discharge at his next visit.   Pt will benefit from skilled therapeutic intervention in order to improve on the following deficits Increased edema;Decreased  knowledge of precautions;Decreased knowledge of use of DME   Rehab Potential Good   PT Frequency 2x / week   PT Duration 4 weeks   PT Treatment/Interventions Manual lymph drainage;Compression bandaging;Patient/family education;ADLs/Self Care Home Management   PT Next Visit Plan Discharge.  Remeasure circumference, review self manual lymph drainage and discharge him to the Maintenance Phase.  Patient plans to contact DME rep about getting head and neck garment and he reprots if insurance does not cover that, he may decline.   Consulted and Agree with Plan of Care Patient        Problem List Patient Active Problem List   Diagnosis Date Noted  . Tonsil cancer 01/23/2014  . Statin intolerance 07/13/2012  . Hypertriglyceridemia 06/23/2011  . CAD (coronary artery disease) 06/23/2011  . Former smoker 06/23/2011  . DYSPEPSIA&OTHER [facility] DISORDERS FUNCTION STOMACH 03/29/2009    [name], PT 06/25/2014, 3:11 PM  [place], [place], [address] Phone: [phone]   Fax:  [phone]

## 2014-06-28 ENCOUNTER — Ambulatory Visit: Payer: Medicare Other

## 2014-06-28 DIAGNOSIS — R1310 Dysphagia, unspecified: Secondary | ICD-10-CM

## 2014-06-28 DIAGNOSIS — R29898 Other symptoms and signs involving the musculoskeletal system: Secondary | ICD-10-CM

## 2014-06-28 DIAGNOSIS — I890 Lymphedema, not elsewhere classified: Secondary | ICD-10-CM

## 2014-06-28 DIAGNOSIS — Z5189 Encounter for other specified aftercare: Secondary | ICD-10-CM | POA: Diagnosis not present

## 2014-06-28 NOTE — Therapy (Signed)
[place], [place], [address] Phone: [phone]   Fax:  [phone]  Physical Therapy Treatment  Patient Details  Name: Matthew Moreno MRN: [id number] Date of Birth: 1945/06/12 Referring Provider:  Denita Lung, MD  Encounter Date: 06/28/2014      PT End of Session - 06/28/14 1518    Visit Number 12   Number of Visits 12   Date for PT Re-Evaluation 07/20/14   PT Start Time 1435   PT Stop Time 1517   PT Time Calculation (min) 42 min      Past Medical History  Diagnosis Date  . Nephrolithiasis     H/O multiple "kidney stones", Lithotripsies, J-Stents  . CAD (coronary artery disease) 2008  . Dyslipidemia   . Panic attacks   . Family history of heart disease   . Hypertension   . Statin intolerance   . 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 S/P radiation therapy 02/12/2014-04/02/2014    Left tonsil and bilateral neck / 70 Gy in 35 fractions to gross disease, 63 Gy in 35 fractions to high risk nodalechelons, and 56 Gy in 35 fractions to intermediate risk nodal echelons     Past Surgical History  Procedure Laterality Date  . Colonoscopy  2010    Dr. Ardis Hughs  . Biceps tendon repair Left   . Cardiac catheterization  06/19/2008    normal left main, Cfx with 3 OMs (50% osital narrowing in OM1), LAD densely calcified in the proximal half of vessel with eccentric narrowing in mid region (40-50% severity), diag 1 & 2 free of disease, RCA with less than 30% narrowing in midportion (Dr. Loni Muse. Little)    There were no vitals taken for this visit.  Visit Diagnosis:  Lymphedema  Decreased range of motion of neck  Dysphagia      Subjective Assessment - 06/28/14 1436    Symptoms They contacted me about the garment and its going to be $152 and thats just too much, so Im not going to get it now.             LYMPHEDEMA/ONCOLOGY QUESTIONNAIRE - 06/28/14 1440    Head and Neck   4 cm superior to  sternal notch around neck 37.6 cm   6 cm superior to sternal notch around neck 37.4 cm   8 cm superior to sternal notch around neck 38.4 cm   Other 10 cm superior 39.2 cm   Other 12 cm. superior:  40.6 cm.               [facility] Adult PT Treatment/Exercise - 06/28/14 0001    Manual Therapy   Manual Lymphatic Drainage (MLD) In Supine: Short and long neck, bil shoulder collectors, and bil axillae nodes, superficial and deep abdominals, anterior upper quadrants, anterio, lateral and posterior neck, submental and submandibular nodes, bil masseters, pre-a nd retroauricular nodes and forehead all directing towards lateral neck.   Passive ROM Cervical PROM throughout treatment into bil side bends and rotation and manual traction to pts tolerance.                        [facility] Goals - 06/28/14 1449    CC Long Term Goal  #1   Title Pt. will be independent in performing self-manual lymph drainage for his neck swelling.   Status Achieved   CC Long Term Goal  #3   Title Pt. will  show at least 1 cm. decrease in circumference of neck at 10 cm. superior to sternal notch.  39.2 cm on 06/28/14   Status Not Met            Plan - 06/28/14 1518    Clinical Impression Statement Pt discharged today. Is compliant with self manual lymph drainage and wearing chip pack.   Pt will benefit from skilled therapeutic intervention in order to improve on the following deficits Increased edema;Decreased knowledge of precautions;Decreased knowledge of use of DME   Rehab Potential Good   PT Frequency 2x / week   PT Duration 4 weeks   PT Treatment/Interventions Manual lymph drainage;Compression bandaging;Patient/family education;ADLs/Self Care Home Management   PT Next Visit Plan Discharge visit.   Consulted and Agree with Plan of Care Patient        Problem List Patient Active Problem List   Diagnosis Date Noted  . Tonsil cancer 01/23/2014  . Statin intolerance 07/13/2012  .  Hypertriglyceridemia 06/23/2011  . CAD (coronary artery disease) 06/23/2011  . Former smoker 06/23/2011  . DYSPEPSIA&OTHER [facility] DISORDERS FUNCTION STOMACH 03/29/2009    Matthew Moreno, PTA 06/28/2014, 3:21 PM  PHYSICAL THERAPY DISCHARGE SUMMARY  Visits from Start of Care: 12 Current functional level related to goals / functional outcomes: Patient is currently independent with performing self manual lymph drainage to reduce and manage his swelling.  He reports being pleased with his progress although there was no significant measurable reduction in swelling.  He reported his swelling would reduce at times and then be increased at others.  He is using his chip pack around his neck to manage his swelling.  He is independent with the Maintenance Phase of lympehedema care and agrees to contact us if his symptoms worsen.   Remaining deficits: He continues to have mild swelling present on his anterior neck.   Education / Equipment: Chip pack to be worn around his neck to reduce and manage swelling which was made by his therapist.  Plan: Patient agrees to discharge.  Patient goals were partially met. Patient is being discharged due to being pleased with the current functional level.  ?????       Matthew Moreno, [place] 07/01/2014, 4:08 PM   [place], [place], [address] Phone: [phone]   Fax:  [phone]

## 2014-07-02 ENCOUNTER — Ambulatory Visit: Payer: Medicare Other

## 2014-07-05 ENCOUNTER — Ambulatory Visit: Payer: Medicare Other | Admitting: Physical Therapy

## 2014-07-09 ENCOUNTER — Ambulatory Visit: Payer: Medicare Other | Admitting: Physical Therapy

## 2014-07-16 ENCOUNTER — Ambulatory Visit: Payer: Medicare Other | Admitting: Physical Therapy

## 2014-07-20 ENCOUNTER — Encounter: Payer: Self-pay | Admitting: Radiation Oncology

## 2014-07-20 NOTE — Progress Notes (Signed)
Clld AIM for [employer] auth for PET scan on 3/15-auth#[id number] 07/20/14-01/20/15.

## 2014-07-26 ENCOUNTER — Telehealth: Payer: Self-pay | Admitting: *Deleted

## 2014-07-26 NOTE — Telephone Encounter (Signed)
CALLED PATIENT TO ASK ABOUT COMING IN FOR A TSH, SPOKE WITH PATIENT AND HE WILL BE HERE ON 07-31-14 @ 12:15 PM

## 2014-07-31 ENCOUNTER — Ambulatory Visit
Admission: RE | Admit: 2014-07-31 | Discharge: 2014-07-31 | Disposition: A | Discharge status: Home / Self Care | Payer: Medicare Other | Source: Ambulatory Visit | Attending: Radiation Oncology | Admitting: Radiation Oncology

## 2014-07-31 ENCOUNTER — Ambulatory Visit (HOSPITAL_COMMUNITY)
Admission: RE | Admit: 2014-07-31 | Discharge: 2014-07-31 | Disposition: A | Discharge status: Home / Self Care | Payer: Medicare Other | Source: Ambulatory Visit | Attending: Radiation Oncology | Admitting: Radiation Oncology

## 2014-07-31 DIAGNOSIS — C099 Malignant neoplasm of tonsil, unspecified: Secondary | ICD-10-CM

## 2014-07-31 DIAGNOSIS — Z923 Personal history of irradiation: Secondary | ICD-10-CM | POA: Diagnosis not present

## 2014-07-31 DIAGNOSIS — R634 Abnormal weight loss: Secondary | ICD-10-CM

## 2014-07-31 DIAGNOSIS — Z08 Encounter for follow-up examination after completed treatment for malignant neoplasm: Secondary | ICD-10-CM | POA: Diagnosis present

## 2014-07-31 LAB — TSH CHCC: TSH: 1.068 m[IU]/L (ref 0.320–4.118)

## 2014-07-31 LAB — GLUCOSE, CAPILLARY: GLUCOSE-CAPILLARY: 103 mg/dL — AB (ref 70–99)

## 2014-07-31 MED ORDER — FLUDEOXYGLUCOSE F - 18 (FDG) INJECTION
6.7600 | Freq: Once | INTRAVENOUS | Status: AC | PRN
  Administered 2014-07-31: 6.76 via INTRAVENOUS

## 2014-08-01 ENCOUNTER — Encounter: Payer: Self-pay | Admitting: Radiation Oncology

## 2014-08-01 ENCOUNTER — Ambulatory Visit
Admission: RE | Admit: 2014-08-01 | Discharge: 2014-08-01 | Disposition: A | Discharge status: Home / Self Care | Payer: Medicare Other | Source: Ambulatory Visit | Attending: Radiation Oncology | Admitting: Radiation Oncology

## 2014-08-01 VITALS — BP 137/65 | HR 65 | Temp 98.0°F | Resp 18 | Wt 135.5 lb

## 2014-08-01 DIAGNOSIS — C099 Malignant neoplasm of tonsil, unspecified: Secondary | ICD-10-CM

## 2014-08-01 NOTE — Progress Notes (Signed)
Radiation Oncology         [phone]) [phone] ________________________________  Name: Matthew Moreno MRN: [id number]  Date: 08/01/2014  DOB: January 24, 1946  Follow-Up Visit Note  CC: [name], MD  [name], *  Diagnosis and Prior Radiotherapy:       ICD-9-CM ICD-10-CM   1. 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 He states, "I feel great." Patient plans to work in his garden this afternoon. Eight pound weight loss noted since December 2015, but this has been stable for past month, he reports.  He is pleased at this weight and intends to maintain it.. Patient states, "My appetite is great." Denies pain or difficulty associated with swallowing. No sores or ulcerations noted of oral mucosa. Skin of anterior neck of normal color and appearance. Denies headache, dizziness, nausea, vomiting or diarrhea.      ALLERGIES:  is allergic to bystolic; lipitor; lisinopril; metoprolol; pravastatin; red yeast rice; sulfonamide derivatives; and zocor.  Meds: Current Outpatient Prescriptions  Medication Sig Dispense Refill  . ALPRAZolam (XANAX) 0.5 MG tablet Take 1 tablet (0.5 mg total) by mouth at bedtime as needed. 30 tablet 4  . aspirin 81 MG tablet Take 160 mg by mouth daily.    . calcium carbonate (TUMS EX) 750 MG chewable tablet Chew 1 tablet by mouth daily.    . fenofibrate 160 MG tablet TAKE 1 TABLET (160 MG TOTAL) BY MOUTH DAILY. 30 tablet 1  . GARLIC PO Take 1 capsule by mouth daily.    . metoprolol tartrate (LOPRESSOR) 25 MG tablet TAKE 1/2 TABLET (12.5mg ) BY MOUTH  TWICE DAILY 90 tablet 1  . Multiple Vitamins-Minerals (MULTIVITAMIN WITH MINERALS) tablet Take 1 tablet by mouth daily.    . Omega-3 Fatty Acids (FISH OIL) 1000 MG CAPS Take 1,000 mg by mouth 2 (two) times daily.    . sodium fluoride (FLUORISHIELD) 1.1 % GEL dental gel Instill 1 drop into each tooth space of fluoride tray. Place over teeth for 5 minutes. Remove. Spit out excess. Repeat nightly. 120 mL prn   No current facility-administered medications for this encounter.   Facility-Administered Medications Ordered in Other Encounters  Medication Dose Route Frequency Provider Last Rate Last Dose  . influenza  inactive virus vaccine (FLUZONE/FLUARIX) injection 0.5 mL  0.5 mL Intramuscular Once Denita Lung, MD        Physical Findings:  Wt Readings from Last 3 Encounters:  04/26/14 143 lb (64.864 kg)  03/26/14 142 lb 9.6 oz (64.683 kg)  03/09/14 148 lb (67.132 kg)    The patient is in no acute distress. Patient is alert and oriented.  weight is 135 lb 8 oz (61.462 kg). His oral temperature is 98 F (36.7 C). His blood pressure is 137/65 and his pulse is 65. His respiration is 18 and oxygen saturation is 100%. .  Oropharyngeal mucosa with no thrush or lesions.  No palpable cervical or supraclavicular lymphadenopathy. Skin intact and smooth over neck.     Lab Findings: Lab Results  Component Value Date   WBC 4.2 03/12/2014  HGB 13.4 03/12/2014   HCT 38.9 03/12/2014   MCV 92.8 03/12/2014   PLT 241 03/12/2014    Lab Results  Component Value Date   TSH 1.068 07/31/2014    Radiographic Findings: Nm Pet Image Restag (ps) Skull Base To Thigh  07/31/2014   CLINICAL DATA:  Subsequent treatment strategy for tonsillar carcinoma. Status post radiation therapy.  EXAM: NUCLEAR MEDICINE PET SKULL BASE TO THIGH  TECHNIQUE: 6.76 mCi F-18 FDG was injected intravenously. Full-ring PET imaging was performed from the skull base to thigh after the radiotracer. CT data was obtained and used for  attenuation correction and anatomic localization.  FASTING BLOOD GLUCOSE:  Value: 103 mg/dl  COMPARISON:  PET-CT 01/16/2014.  FINDINGS: NECK  No residual hypermetabolic cervical lymph nodes identified.There are no lesions of the pharyngeal mucosal space. Moderate diffuse atherosclerosis of the carotid arteries noted. There is mild edema throughout the subcutaneous fat attributed to prior radiation therapy.  CHEST  There are no hypermetabolic mediastinal, hilar or axillary lymph nodes. There is no abnormal pulmonary metabolic activity. The lungs are stable with mild biapical scarring. Diffuse atherosclerosis of the aorta, great vessels and coronary arteries noted. There is a stable sebaceous cyst in the mid right back.  ABDOMEN/PELVIS  There is no hypermetabolic activity within the liver, adrenal glands, spleen or pancreas. There is no hypermetabolic nodal activity. Right renal cyst, nonobstructing left renal calculus, aortoiliac atherosclerosis and prostatic dystrophic calcifications are grossly stable.  SKELETON  There is no hypermetabolic activity to suggest osseous metastatic disease.  IMPRESSION: 1. Interval resolution of hypermetabolic left cervical adenopathy. No evidence of local recurrence. 2. No evidence of distant metastatic disease or new primary malignancy in the chest. 3. Stable incidental findings as described.   Electronically Signed   By: Richardean Sale M.D.   On: 07/31/2014 13:32    Impression/Plan:    1) Head and Neck Cancer Status: NED  2) Nutritional Status: - weight: overall decreased, but stable over past month. Pt pleased with current wieght. knows to call Dory Peru if weight goes <130lb No PEG.  3) Risk Factors: The patient has been educated about risk factors including alcohol and tobacco abuse; they understand that avoidance of alcohol and tobacco is important to prevent recurrences as well as other cancers. He quit smoking in 1985.  4) Swallowing: good  5) Dental:  Encouraged to continue regular followup with dentistry, and dental hygiene including fluoride rinses.   6) Thyroid function: normal  7) Social: No active social issues to address at this time  8) Other: none  9) Follow-up in 40mo.  Gayleen Orem, RN, our Head and Neck Oncology Navigator will arrange f/u with Dr Lucia Gaskins in 3 mo.  NCCN guidelines discussed, which recommend surveillance imaging PRN only, for concerning signs/symptoms.  Otherwise, just physical exams are recommended.   The patient was encouraged to call with any issues or questions before then ------------------------------------------------------  Eppie Gibson, MD

## 2014-08-01 NOTE — Progress Notes (Signed)
Patient states, "I feel great." Patient plans to work in his garden this afternoon. Eight pound weight loss noted since December 2015. Patient states, "My appetite is great." Denies pain or difficulty associated with swallowing. No sores or ulcerations noted of oral mucosa. Skin of anterior neck of normal color and appearance. Denies headache, dizziness, nausea, vomiting or diarrhea.

## 2014-08-03 ENCOUNTER — Telehealth: Payer: Self-pay | Admitting: *Deleted

## 2014-08-03 NOTE — Telephone Encounter (Signed)
Spoke with Gay Filler at Dr. Pollie Friar office, indicated Dr. Isidore Moos requests patient have 3 month follow-up appt with Dr. Lucia Gaskins.  She verbalized understanding, stated they will call patient to arrange.  Gayleen Orem, RN, BSN, [facility] [phone]

## 2014-10-30 ENCOUNTER — Other Ambulatory Visit: Payer: Self-pay | Admitting: Internal Medicine

## 2014-10-30 ENCOUNTER — Encounter: Payer: Self-pay | Admitting: Internal Medicine

## 2014-10-30 ENCOUNTER — Ambulatory Visit (INDEPENDENT_AMBULATORY_CARE_PROVIDER_SITE_OTHER): Payer: Medicare Other | Admitting: Internal Medicine

## 2014-10-30 VITALS — BP 124/66 | HR 64 | Ht 66.0 in | Wt 130.4 lb

## 2014-10-30 DIAGNOSIS — Z889 Allergy status to unspecified drugs, medicaments and biological substances status: Secondary | ICD-10-CM

## 2014-10-30 DIAGNOSIS — Z789 Other specified health status: Secondary | ICD-10-CM

## 2014-10-30 DIAGNOSIS — I2510 Atherosclerotic heart disease of native coronary artery without angina pectoris: Secondary | ICD-10-CM

## 2014-10-30 DIAGNOSIS — E781 Pure hyperglyceridemia: Secondary | ICD-10-CM

## 2014-10-30 DIAGNOSIS — I2583 Coronary atherosclerosis due to lipid rich plaque: Secondary | ICD-10-CM

## 2014-10-30 DIAGNOSIS — C099 Malignant neoplasm of tonsil, unspecified: Secondary | ICD-10-CM | POA: Diagnosis not present

## 2014-10-30 NOTE — Progress Notes (Signed)
OFFICE NOTE  Chief Complaint:  Routine followup  Primary Care Physician: [name], MD  HPI:  Matthew Moreno is a 69 year old gentleman we are following for history of coronary disease, moderate, in 2010 on catheterization, 50% LAD, 40% RCA and 50% OM lesion. He also has hypertension difficult to control, dyslipidemia and intolerance to statins. He has returned today with worsening leg pain which now he is attributing to fenofibrate. His most recent lipid profile obtained on April 11, 2012, showed total cholesterol 241, triglycerides 217, LDL was 158, HDL 40. His triglycerides are actually down from about 400 last year with the fenofibrate so that seems to be working. Unfortunately, he was directed to take Livalo 2 mg daily last year at his last visit but did not start that and is now contemplating discontinuing the fenofibrate as he is concerned this is causing his muscle pain. Since his last visit, he has stopped both the little and fenofibrate. Prior to his last blood work, he went out with some friends and was drinking bourbon in his triglycerides were 1400. Therefore difficult to know what his LDL actually is without measuring a direct LDL. We discussed options as alternatives to statins including WelChol and his idea, however he is not interested in any further medications.  Mr. Cupps has no new complaints today. 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 He is undergone radiation therapy and is apparently clear of this. Unfortunately he was intolerant to the statins and cannot afford to take fenofibrate. He is currently not on any therapy for lipid lowering and does have moderate coronary artery disease. We have discussed this in the past and felt there were a few additional options. He does say that he lost 20 or 30 pounds recently due to his cancer and wonders if his cholesterol is improved.  PMHx:  Past Medical History  Diagnosis Date  .  Nephrolithiasis     H/O multiple "kidney stones", Lithotripsies, J-Stents  . CAD (coronary artery disease) 2008  . Dyslipidemia   . Panic attacks   . Family history of heart disease   . Hypertension   . Statin intolerance   . 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 S/P radiation therapy 02/12/2014-04/02/2014    Left tonsil and bilateral neck / 70 Gy in 35 fractions to gross disease, 63 Gy in 35 fractions to high risk nodalechelons, and 56 Gy in 35 fractions to intermediate risk nodal echelons     Past Surgical History  Procedure Laterality Date  . Colonoscopy  2010    Dr. Ardis Hughs  . Biceps tendon repair Left   . Cardiac catheterization  06/19/2008    normal left main, Cfx with 3 OMs (50% osital narrowing in OM1), LAD densely calcified in the proximal half of vessel with eccentric narrowing in mid region (40-50% severity), diag 1 & 2 free of disease, RCA with less than 30% narrowing in midportion (Dr. Rockne Menghini)    FAMHx:  Family History  Problem Relation Age of Onset  . Heart Problems Mother   . Heart Problems Father     SOCHx:   reports that he quit smoking about 31 years ago. His smoking use included Cigarettes. He has a 15 pack-year smoking history. He has never used smokeless tobacco. He reports that he drinks about 1.8 oz of alcohol per week. He reports that he does not use illicit drugs.  ALLERGIES:  Allergies  Allergen Reactions  . Bystolic [Nebivolol Hcl]   .  Lipitor [Atorvastatin] Other (See Comments)    Muscle aches with statins  . Lisinopril   . Metoprolol   . Pravastatin   . Red Yeast Rice [Cholestin]   . Sulfonamide Derivatives     REACTION: hives  . Zocor [Simvastatin]     ROS: A comprehensive review of systems was negative.  HOME MEDS: Current Outpatient Prescriptions  Medication Sig Dispense Refill  . ALPRAZolam (XANAX) 0.5 MG tablet Take 1 tablet (0.5 mg total) by mouth at bedtime as needed. 30 tablet 4  . aspirin 81 MG tablet Take 160 mg by mouth daily.     . calcium carbonate (TUMS EX) 750 MG chewable tablet Chew 1 tablet by mouth daily.    . fenofibrate 160 MG tablet TAKE 1 TABLET (160 MG TOTAL) BY MOUTH DAILY. 30 tablet 1  . GARLIC PO Take 1 capsule by mouth daily.    . metoprolol tartrate (LOPRESSOR) 25 MG tablet TAKE 1/2 TABLET TWICE DAILY 30 tablet 6  . Multiple Vitamins-Minerals (MULTIVITAMIN WITH MINERALS) tablet Take 1 tablet by mouth daily.    . Omega-3 Fatty Acids (FISH OIL) 1000 MG CAPS Take 1,000 mg by mouth 2 (two) times daily.    . sodium fluoride (FLUORISHIELD) 1.1 % GEL dental gel Instill 1 drop into each tooth space of fluoride tray. Place over teeth for 5 minutes. Remove. Spit out excess. Repeat nightly. 120 mL prn   No current facility-administered medications for this visit.   Facility-Administered Medications Ordered in Other Visits  Medication Dose Route Frequency Provider Last Rate Last Dose  . influenza  inactive virus vaccine (FLUZONE/FLUARIX) injection 0.5 mL  0.5 mL Intramuscular Once Denita Lung, MD        LABS/IMAGING: No results found for this or any previous visit (from the past 48 hour(s)). No results found.  VITALS: BP 124/66 mmHg  Pulse 64  Ht 5\' 6"  (1.676 m)  Wt 130 lb 6.4 oz (59.149 kg)  BMI 21.06 kg/m2  EXAM: General appearance: alert and no distress Neck: no adenopathy, no carotid bruit, no JVD, supple, symmetrical, trachea midline and thyroid not enlarged, symmetric, no tenderness/mass/nodules Lungs: clear to auscultation bilaterally Heart: regular rate and rhythm, S1, S2 normal, no murmur, click, rub or gallop Abdomen: soft, non-tender; bowel sounds normal; no masses,  no organomegaly Extremities: extremities normal, atraumatic, no cyanosis or edema Pulses: 2+ and symmetric Skin: Skin color, texture, turgor normal. No rashes or lesions Neurologic: Grossly normal Psych: Mood, affect normal  EKG: Normal sinus rhythm at 90  ASSESSMENT: 1. Moderate nonobstructive coronary artery  disease 2. Hypertension 3. Dyslipidemia 4. Statin intolerant 5. Recent tonsillar cancer  PLAN: 1.   Mr. Clauson unfortunately had recent tonsillar cancer. He is recovering from this. He underwent radiation therapy to the neck. He has been statin intolerant and has a significant dyslipidemia. Will need to reassess that as he's lost about 20-30 pounds. We again need to consider an option such as PCSK9 inhibitors however he has to be on max tolerated dose of a minimal statin. He denies any chest pain or worsening shortness of breath. We'll plan to see him back annually or sooner as necessary changes to his cholesterol management could be made once we see his numbers.  Pixie Casino, MD, [facility] Attending Cardiologist [place] [facility] 10/30/2014, 1:52 PM

## 2014-10-30 NOTE — Patient Instructions (Signed)
Your physician wants you to follow-up in: 1 Year You will receive a reminder letter in the mail two months in advance. If you don't receive a letter, please call our office to schedule the follow-up appointment.  Your physician recommends that you return for lab work Fasting lipids

## 2014-11-01 LAB — LIPID PANEL
CHOL/HDL RATIO: 5 ratio
CHOLESTEROL: 196 mg/dL (ref 0–200)
HDL: 39 mg/dL — AB (ref >=40)
LDL Cholesterol: 102 mg/dL — ABNORMAL HIGH (ref 0–99)
Triglycerides: 276 mg/dL — ABNORMAL HIGH (ref <150)
VLDL: 55 mg/dL — AB (ref 0–40)

## 2014-11-02 ENCOUNTER — Telehealth: Payer: Self-pay | Admitting: Internal Medicine

## 2014-11-02 NOTE — Telephone Encounter (Signed)
Pt would like his lab results from yesterday please. °

## 2014-11-02 NOTE — Telephone Encounter (Signed)
Spoke with pt, aware of prilimary lipid results. Aware dr Debara Pickett has not reviewed.

## 2014-11-05 ENCOUNTER — Encounter: Payer: Self-pay | Admitting: *Deleted

## 2015-01-23 NOTE — Progress Notes (Signed)
BP 107/57 mmHg  Pulse 67  Temp(Src) 97.9 F (36.6 C)  Ht 5\' 6"  (1.676 m)  Wt 130 lb 1.6 oz (59.013 kg)  BMI 21.01 kg/m2 Pain issues, if any: No Using a feeding tube?: No Weight changes, if any:  Wt Readings from Last 3 Encounters:  01/25/15 130 lb 1.6 oz (59.013 kg)  10/30/14 130 lb 6.4 oz (59.149 kg)  08/01/14 135 lb 8 oz (61.462 kg)   Swallowing issues, if LMR:AJHH, reports need to chew a lot and drink " a lot of water" to swallow.  Smoking or chewing tobacco? No Using fluoride trays daily? Yes Last ENT visit was on: Dr. Radene Journey - March 2016 - called for note Other notable issues, if any:

## 2015-01-25 ENCOUNTER — Encounter: Payer: Self-pay | Admitting: *Deleted

## 2015-01-25 ENCOUNTER — Telehealth: Payer: Self-pay | Admitting: *Deleted

## 2015-01-25 ENCOUNTER — Encounter: Payer: Self-pay | Admitting: Radiation Oncology

## 2015-01-25 ENCOUNTER — Ambulatory Visit
Admission: RE | Admit: 2015-01-25 | Discharge: 2015-01-25 | Disposition: A | Discharge status: Home / Self Care | Payer: Medicare Other | Source: Ambulatory Visit | Attending: Radiation Oncology | Admitting: Radiation Oncology

## 2015-01-25 VITALS — BP 107/57 | HR 67 | Temp 97.9°F | Ht 66.0 in | Wt 130.1 lb

## 2015-01-25 DIAGNOSIS — R634 Abnormal weight loss: Secondary | ICD-10-CM

## 2015-01-25 DIAGNOSIS — C099 Malignant neoplasm of tonsil, unspecified: Secondary | ICD-10-CM

## 2015-01-25 NOTE — Progress Notes (Signed)
Radiation Oncology         [phone] ________________________________  Name: Matthew Moreno MRN: [id number]  Date: 01/25/2015  DOB: [birth year]  Follow-Up Visit Note  CC: [name], MD  [name], *  Diagnosis and Prior Radiotherapy:     No diagnosis found. 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 He has recently seen his cardiologist (a few months ago) and his cholesterol was reported to be in healthy range.  He stated that he "feels great" in managing his recent weight loss which is stable, now. He reports no symptoms of pain, overall. He does state that he still needs to chew his food a lot. The patient states that he does not chew tobacco or smoke and is not taking any current cardiac medications.  Pain issues, if any: No Using a feeding tube?: No Weight changes, if any: No Swallowing issues, if GUR:KYHC, reports need to chew a lot and drink " a lot of water" to swallow.  Smoking or chewing tobacco? No Using fluoride trays daily? Yes Last ENT visit was on: Dr. Radene Journey - March 2016 - called for note Other notable issues, if any: No   ALLERGIES:  is allergic to bystolic; lipitor; lisinopril; metoprolol; pravastatin; red yeast rice; sulfonamide derivatives; and zocor.  Meds: Current Outpatient Prescriptions  Medication Sig Dispense Refill  . ALPRAZolam (XANAX) 0.5 MG tablet Take 1 tablet (0.5 mg total) by mouth at bedtime as needed. 30 tablet 4  . aspirin 81 MG tablet Take 160 mg by mouth daily.    . calcium carbonate (TUMS EX) 750  MG chewable tablet Chew 1 tablet by mouth daily.    . fenofibrate 160 MG tablet TAKE 1 TABLET (160 MG TOTAL) BY MOUTH DAILY. 30 tablet 1  . GARLIC PO Take 1 capsule by mouth daily.    . metoprolol tartrate (LOPRESSOR) 25 MG tablet TAKE 1/2 TABLET TWICE DAILY 30 tablet 6  . Multiple Vitamins-Minerals (MULTIVITAMIN WITH MINERALS) tablet Take 1 tablet by mouth daily.    . Omega-3 Fatty Acids (FISH OIL) 1000 MG CAPS Take 1,000 mg by mouth 2 (two) times daily.    . sodium fluoride (FLUORISHIELD) 1.1 % GEL dental gel Instill 1 drop into each tooth space of fluoride tray. Place over teeth for 5 minutes. Remove. Spit out excess. Repeat nightly. 120 mL prn   No current facility-administered medications for this encounter.   Facility-Administered Medications Ordered in Other Encounters  Medication Dose Route Frequency Provider Last Rate Last Dose  . influenza  inactive virus vaccine (FLUZONE/FLUARIX) injection 0.5 mL  0.5 mL Intramuscular Once Denita Lung, MD        Physical Findings:  Wt Readings from Last 3 Encounters:  01/25/15 130 lb 1.6 oz (59.013 kg)  10/30/14 130 lb 6.4 oz (59.149 kg)  08/01/14 135 lb 8 oz (61.462 kg)    height is 5\' 6"  (1.676 m) and weight is 130 lb 1.6 oz (59.013 kg). His temperature is 97.9 F (36.6 C). His blood pressure is 107/57 and his pulse is 67. Marland Kitchen  General: Alert and oriented, in no acute distress   HEENT: Head is normocephalic. Extraocular movements are intact. Oropharynx is clear. The patient has both top and bottom dentures. No lesions or thrush is to be noted at this time. Neck: Neck is supple, no palpable cervical or supraclavicular lymphadenopathy.  Skin: is intact and smooth. The patient stated that the hair does not grow back as well on the left side of his neck. Psychiatric: Judgment and insight are intact. Affect is appropriate.  Lab Findings: Lab Results  Component Value Date   WBC 4.2 03/12/2014   HGB 13.4 03/12/2014   HCT 38.9 03/12/2014    MCV 92.8 03/12/2014   PLT 241 03/12/2014    Lab Results  Component Value Date   TSH 1.068 07/31/2014    Radiographic Findings: No results found.  Impression/Plan:  Matthew Moreno is a 69 year old male presenting to clinic in regards to his cancer of the left tonsil. The patient is advised of his follow up with radiation oncology to take place in April of 2017 and of his follow up appointment with Dr. Lucia Gaskins in December of 2016. He was reminded of the importance of using his fluoride trays as instructed. The patient was informed of the importance of completing annual thyroid exams. The patient was invited and educated on the purpose and benefits of partaking in the [facility] program by Liliane Channel, Therapist, sports for survivors. 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 All vocalized questions and concerns have been addressed. If the patient develops any further questions or concerns in regards to her treatment and recovery, she has been encouraged to contact Dr. Isidore Moos.   1) Head and Neck Cancer Status: NED  2) Nutritional Status: His overall weight has decreased, but stablized over the past month. The patient is pleased with current weight and knows to call Dory Peru if weight goes below 130 pounds. No PEG.  3) Risk Factors: The patient has been educated about risk factors including alcohol and tobacco abuse; they understand that avoidance of alcohol and tobacco is important to prevent recurrences as well as other cancers. He quit smoking in 1985.  4) Swallowing: good  5) Dental: Encouraged to continue regular followup with dentistry, and dental hygiene including fluoride rinses.   6) Thyroid function: normal  Lab Results  Component Value Date   TSH 1.068 07/31/2014    7) Social: No active social issues to address at this time  8) Other: none  9) Follow-up with radiation oncology to take place in April of 2017. TSH to be checked then,  again. Continue seeing ENT.  This document serves as a record of services personally performed by Eppie Gibson, MD. It was created on her behalf by Lenn Cal, a trained medical scribe. The creation of this record is based on the scribe's personal observations and the provider's statements to them. This document has been checked and approved by the attending provider. ------------------------------------------------------  Eppie Gibson, MD

## 2015-01-25 NOTE — Telephone Encounter (Signed)
Called Dr. Radene Journey office, asked for last ofice visit note, spoke with Marlowe Kays, his last visit was in June 2016 and they will fax to our office now,thanked Connie 10:54 AM

## 2015-01-28 NOTE — Progress Notes (Signed)
  Oncology Nurse Navigator Documentation   Navigator Encounter Type: Clinic/MDC (01/25/15 1100) Patient Visit Type: Follow-up (01/25/15 1100)     To provide support and encouragement, care continuity and to assess for needs, met with patient during f/u appt with Dr. Isidore Moos. He did not express any needs/concerns. I discussed benefits of attending upcoming [facility] with him but he indicated he is unavailable Tuesday evenings.  I encouraged him to consider attending the [facility] class that will be held in Spring 2017.  He indicated he would consider. He understands he can contact me with future needs/concerns.  Gayleen Orem, RN, BSN, [place] at [place] [phone]                   Time Spent with Patient: 45 (01/25/15 1100)

## 2015-02-04 ENCOUNTER — Telehealth: Payer: Self-pay | Admitting: *Deleted

## 2015-02-04 NOTE — Telephone Encounter (Signed)
CALLED PATIENT TO INFORM OF LAB AND FU VISIT ON 08-23-15, SPOKE WITH PATIENT AND HE IS AWARE OF THESE APPTS.

## 2015-02-06 ENCOUNTER — Other Ambulatory Visit (INDEPENDENT_AMBULATORY_CARE_PROVIDER_SITE_OTHER): Payer: Medicare Other

## 2015-02-06 DIAGNOSIS — Z23 Encounter for immunization: Secondary | ICD-10-CM | POA: Diagnosis not present

## 2015-02-21 ENCOUNTER — Telehealth: Payer: Self-pay

## 2015-02-21 NOTE — Telephone Encounter (Signed)
Medical records sent out to [facility] on 02/21/15

## 2015-03-04 ENCOUNTER — Encounter: Payer: Self-pay | Admitting: Gastroenterology

## 2015-07-29 ENCOUNTER — Other Ambulatory Visit: Payer: Self-pay | Admitting: Internal Medicine

## 2015-07-29 NOTE — Telephone Encounter (Signed)
REFILL 

## 2015-08-15 NOTE — Progress Notes (Signed)
  Mr. Kostelnik presents for follow up of radiation completed 04/02/2014 to his Left Tonsil and Bilateral Neck.  Pain issues, if any: No Using a feeding tube?: No Weight changes, if any:  Wt Readings from Last 3 Encounters:  08/23/15 134 lb 14.4 oz (61.19 kg)  01/25/15 130 lb 1.6 oz (59.013 kg)  10/30/14 130 lb 6.4 oz (59.149 kg)   Swallowing issues, if any: He reports getting "choked" at times while eating. He reports eating anything he wants, he just needs to chew his food into smaller pieces. He also has water with him at all times because of a dry throat.  Smoking or chewing tobacco? No Using fluoride trays daily? He using them every night Last ENT visit was on:  Dr. Lucia Gaskins 04/22/15, he believes he is supposed to see him again in July or August.  Other notable issues, if any: He reports none.  BP 160/77 mmHg  Pulse 75  Temp(Src) 97.6 F (36.4 C)  Ht 5\' 6"  (1.676 m)  Wt 134 lb 14.4 oz (61.19 kg)  BMI 21.78 kg/m2  SpO2 100%

## 2015-08-23 ENCOUNTER — Telehealth: Payer: Self-pay | Admitting: *Deleted

## 2015-08-23 ENCOUNTER — Encounter: Payer: Self-pay | Admitting: Radiation Oncology

## 2015-08-23 ENCOUNTER — Telehealth: Payer: Self-pay

## 2015-08-23 ENCOUNTER — Encounter: Payer: Self-pay | Admitting: *Deleted

## 2015-08-23 ENCOUNTER — Ambulatory Visit
Admission: RE | Admit: 2015-08-23 | Discharge: 2015-08-23 | Disposition: A | Discharge status: Home / Self Care | Payer: Medicare Other | Source: Ambulatory Visit | Attending: Radiation Oncology | Admitting: Radiation Oncology

## 2015-08-23 VITALS — BP 160/77 | HR 75 | Temp 97.6°F | Ht 66.0 in | Wt 134.9 lb

## 2015-08-23 DIAGNOSIS — R634 Abnormal weight loss: Secondary | ICD-10-CM

## 2015-08-23 DIAGNOSIS — R635 Abnormal weight gain: Secondary | ICD-10-CM | POA: Diagnosis not present

## 2015-08-23 DIAGNOSIS — C090 Malignant neoplasm of tonsillar fossa: Secondary | ICD-10-CM | POA: Diagnosis not present

## 2015-08-23 LAB — TSH: TSH: 0.657 m(IU)/L (ref 0.320–4.118)

## 2015-08-23 NOTE — Progress Notes (Signed)
Radiation Oncology         ([phone]) [phone] ________________________________  Name: KI AKERMAN MRN: [id number]  Date: 08/23/2015  DOB: [birth year]  Follow-Up Visit Note  CC: [name], MD  Denita Lung, MD  Diagnosis and Prior Radiotherapy:   C09.0 ICD 10 code    ICD-9-CM ICD-10-CM   1. Cancer of tonsillar fossa (HCC) 146.1 C09.0 Referral to Neuro Rehab     SLP modified barium swallow     TSH  2. 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 Spilde presents for follow up of radiation completed 04/02/2014 to his Left Tonsil and Bilateral Neck.  Pain issues, if any: No Using a feeding tube?: No Weight changes, if any:  Wt Readings from Last 3 Encounters:  08/23/15 134 lb 14.4 oz (61.19 kg)  01/25/15 130 lb 1.6 oz (59.013 kg)  10/30/14 130 lb 6.4 oz (59.149 kg)   Swallowing issues, if any: He reports getting "choked" at times while eating. He reports eating anything he wants, he just needs to chew his food into smaller pieces. He also has water with him at all times because of a dry throat.  Smoking or chewing tobacco? No Using fluoride trays daily? He using them every night Last ENT visit was on:  Dr. Lucia Gaskins 04/22/15, he believes he is supposed to see him again in July or August.  Other notable issues, if any: He reports none.  BP 160/77 mmHg  Pulse 75  Temp(Src) 97.6 F (36.4 C)  Ht 5\' 6"  (1.676 m)  Wt 134 lb 14.4 oz (61.19 kg)  BMI 21.78 kg/m2  SpO2 100%   ALLERGIES:  is allergic to bystolic; lipitor; lisinopril; metoprolol; pravastatin; red yeast rice; sulfonamide derivatives; and  zocor.  Meds: Current Outpatient Prescriptions  Medication Sig Dispense Refill  . ALPRAZolam (XANAX) 0.5 MG tablet Take 1 tablet (0.5 mg total) by mouth at bedtime as needed. 30 tablet 4  . aspirin 81 MG tablet Take 160 mg by mouth daily.    . calcium carbonate (TUMS EX) 750 MG chewable tablet Chew 1 tablet by mouth daily.    . metoprolol tartrate (LOPRESSOR) 25 MG tablet TAKE 1/2 TABLET BY MOUTH TWICE DAILY 30 tablet 4  . sodium fluoride (FLUORISHIELD) 1.1 % GEL dental gel Instill 1 drop into each tooth space of fluoride tray. Place over teeth for 5 minutes. Remove. Spit out excess. Repeat nightly. 120 mL prn  . fenofibrate 160 MG tablet TAKE 1 TABLET (160 MG TOTAL) BY MOUTH DAILY. (Patient not taking: Reported on 08/23/2015) 30 tablet 1  . GARLIC PO Take 1 capsule by mouth daily. Reported on 08/23/2015    . Multiple Vitamins-Minerals (MULTIVITAMIN WITH MINERALS) tablet Take 1 tablet by mouth daily. Reported on 08/23/2015    . Omega-3 Fatty Acids (FISH OIL) 1000 MG CAPS Take 1,000 mg by mouth 2 (two) times daily. Reported on 08/23/2015     No current facility-administered medications for this encounter.   Facility-Administered Medications Ordered in Other Encounters  Medication Dose Route Frequency Provider Last Rate Last Dose  . influenza  inactive virus  vaccine (FLUZONE/FLUARIX) injection 0.5 mL  0.5 mL Intramuscular Once Denita Lung, MD        Physical Findings:  Wt Readings from Last 3 Encounters:  08/23/15 134 lb 14.4 oz (61.19 kg)  01/25/15 130 lb 1.6 oz (59.013 kg)  10/30/14 130 lb 6.4 oz (59.149 kg)    height is 5\' 6"  (1.676 m) and weight is 134 lb 14.4 oz (61.19 kg). His temperature is 97.6 F (36.4 C). His blood pressure is 160/77 and his pulse is 75. His oxygen saturation is 100%. .   General: Alert and oriented, in no acute distress   HEENT: Head is normocephalic. Extraocular movements are intact. Oropharynx is clear. The patient has both top and bottom dentures that were  removed. No lesions or thrush is to be noted at this time. Several teeth in poor repair Neck: Neck is supple, no palpable cervical or supraclavicular lymphadenopathy.  Skin: is intact and smooth.  Psychiatric: Judgment and insight are intact. Affect is appropriate. Heart: RRR, no murmurs  Lungs: CTAB  Lab Findings: Lab Results  Component Value Date   WBC 4.2 03/12/2014   HGB 13.4 03/12/2014   HCT 38.9 03/12/2014   MCV 92.8 03/12/2014   PLT 241 03/12/2014    Lab Results  Component Value Date   TSH 0.657 08/23/2015    Radiographic Findings: No results found.  Impression/Plan:   1) Head and Neck Cancer Status: NED  2) Nutritional Status: His overall weight has slightly increased.   3) Risk Factors: The patient has been educated about risk factors including alcohol and tobacco abuse; they understand that avoidance of alcohol and tobacco is important to prevent recurrences as well as other cancers. He quit smoking in 1985.  4) Swallowing: some instances of food feeling stuck, will order MBSS and SLP referral  5) Dental: Encouraged to continue regular followup with dentistry at least 2x/year, and dental hygiene including fluoride rinses. Using fluoride trays.   6) Thyroid function: normal  Lab Results  Component Value Date   TSH 0.657 08/23/2015    7) Social: No active social issues to address at this time  8) Other: Preferred pharmacy: [facility] [address] [place], [place] [address]  9)  The patient is doing well at this time.    Dr.Newman follow up in the summer.  RadOnc follow up in November.   ------------------------------------------------------  Eppie Gibson, MD  This document serves as a record of services personally performed by Eppie Gibson, MD. It was created on her behalf by Derek Mound, a trained medical scribe. The creation of this record is based on the scribe's personal observations and the provider's statements to them. This document has been  checked and approved by the attending provider.

## 2015-08-23 NOTE — Telephone Encounter (Signed)
CALLED PATIENT TO INFORM OF LAB ON 04-03-16 @ 9:30 AM, SPOKE WITH PATIENT AND HE IS AWARE OF THIS APPT.

## 2015-08-23 NOTE — Telephone Encounter (Signed)
I called and spoke with Mr. Piekarski. I let him know that is thyroid function is good. He voiced appreciation of the phone call and know to call if he has any further questions.

## 2015-08-25 NOTE — Progress Notes (Signed)
  Oncology Nurse Navigator Documentation  Navigator Location: [facility] (08/23/15 1015) Navigator Encounter Type: Follow-up Appt (08/23/15 1015)   Abnormal Finding Date: 12/29/13 (08/23/15 1015) Confirmed Diagnosis Date: 01/05/14 (08/23/15 1015)   Treatment Initiated Date: 02/12/14 (08/23/15 1015) Patient Visit Type: Post-XRT (08/23/15 1015) Treatment Phase: Post-Tx Follow-up (08/23/15 1015) Barriers/Navigation Needs: No barriers at this time;No Questions;No Needs (08/23/15 1015)       Met briefly with Mr. Somers prior to his follow-up appt with Dr. Isidore Moos.  He completed XRT for L tonsil carcinoma on 04/02/14.  He reports:  Doing well.    Occasional swallowing difficulty, "food gets caught in my throat".  I encouraged him to mention this to Dr. Isidore Moos. He did not express any needs at this time, I encouraged him to contact me if that changes, he verbalized understanding.  Gayleen Orem, RN, BSN, [place] at [place] [phone]                         Time Spent with Patient: 15 (08/23/15 1015)

## 2015-08-26 ENCOUNTER — Other Ambulatory Visit (HOSPITAL_COMMUNITY): Payer: Self-pay | Admitting: Radiation Oncology

## 2015-08-26 ENCOUNTER — Telehealth: Payer: Self-pay | Admitting: *Deleted

## 2015-08-26 DIAGNOSIS — R1310 Dysphagia, unspecified: Secondary | ICD-10-CM

## 2015-08-26 NOTE — Telephone Encounter (Signed)
CALLED PATIENT  TO INFORM OF MBSS FOR 09-04-15 AND HIS VISIT WITH CARL [place] ON 09-16-15, LVM FOR A RETURN CALL

## 2015-09-04 ENCOUNTER — Other Ambulatory Visit (HOSPITAL_COMMUNITY): Payer: Self-pay

## 2015-09-04 ENCOUNTER — Ambulatory Visit (HOSPITAL_COMMUNITY): Payer: Medicare Other

## 2015-09-10 IMAGING — CT NM PET TUM IMG RESTAG (PS) SKULL BASE T - THIGH
7 series · 25 of 25 positions shown · non-contrast
Comparison: PET-CT 01/16/2014.

CLINICAL DATA: Subsequent treatment strategy for tonsillar
carcinoma. Status post radiation therapy.

EXAM:
NUCLEAR MEDICINE PET SKULL BASE TO THIGH
TECHNIQUE: 6.76 mCi F-18 FDG was injected intravenously. Full-ring PET imaging
was performed from the skull base to thigh after the radiotracer. CT
data was obtained and used for attenuation correction and anatomic
localization.
FASTING BLOOD GLUCOSE:  Value: 103 mg/dl

[Series 3: pet hn_sk_thigh ac · axial · 5.0mm · 4.07mm/px · z∈[-1264,-424]mm · 5 of 211 slices shown]
[im 1/211]
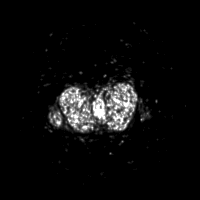
[im 53/211]
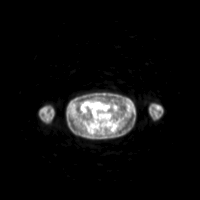
[im 106/211]
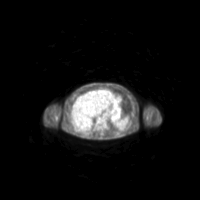
[im 158/211]
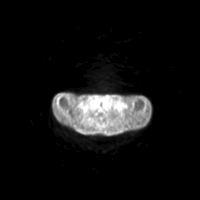
[im 211/211]
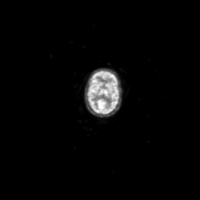

[Series 4: ct hn_sk_th 5.0 b31f · axial · 5.0mm · 0.98mm/px · z∈[-1264,-424]mm · 5 of 211 slices shown]
[im 1/211]
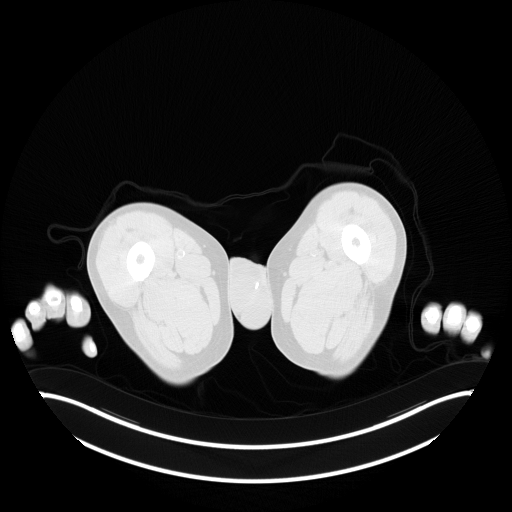
[im 53/211]
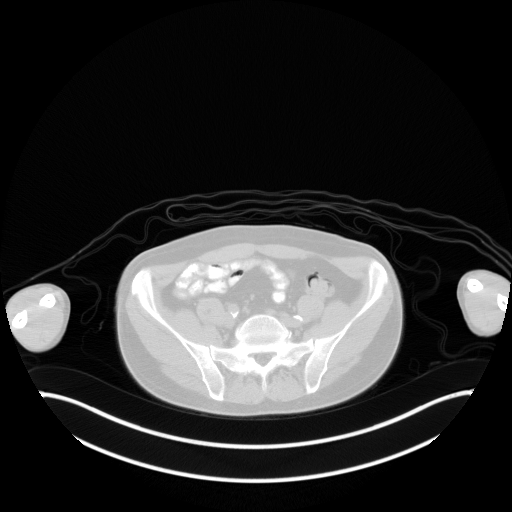
[im 106/211]
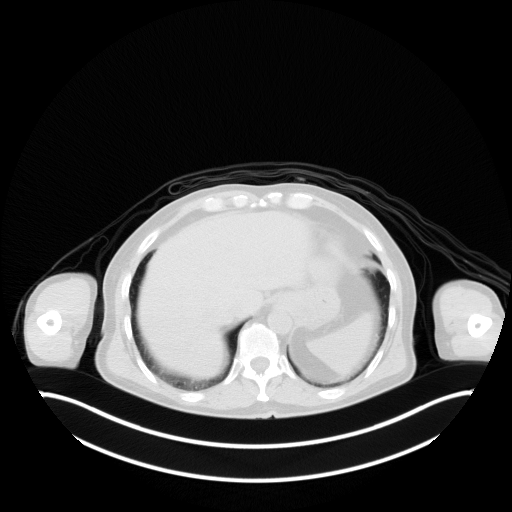
[im 158/211]
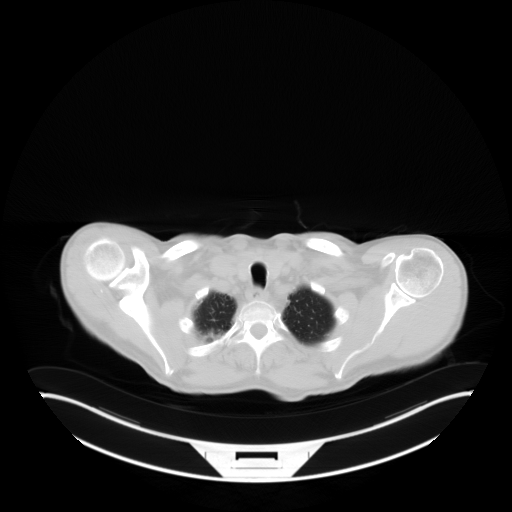
[im 211/211  brain]
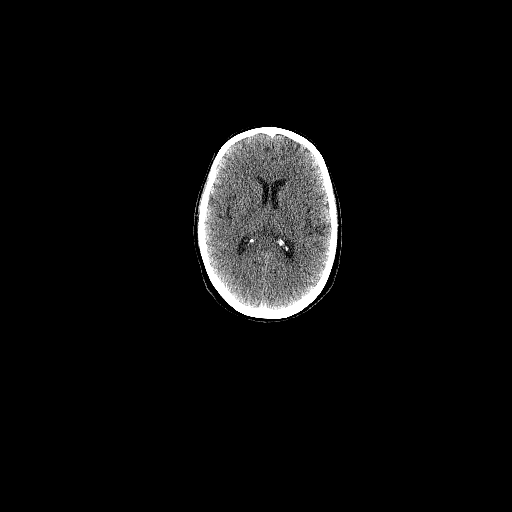

[Series 6: ct hn_sk_th 5.0 b70f lung_bone · axial · 5.0mm · 0.63mm/px · z∈[-850,-570]mm · 2 of 71 slices shown]
[im 1/71  bone]
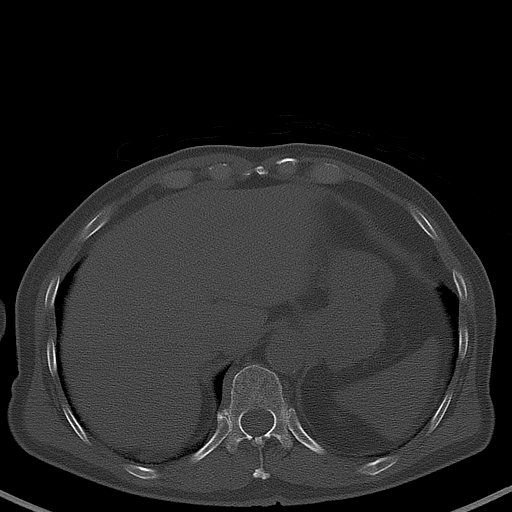
[im 71/71  bone]
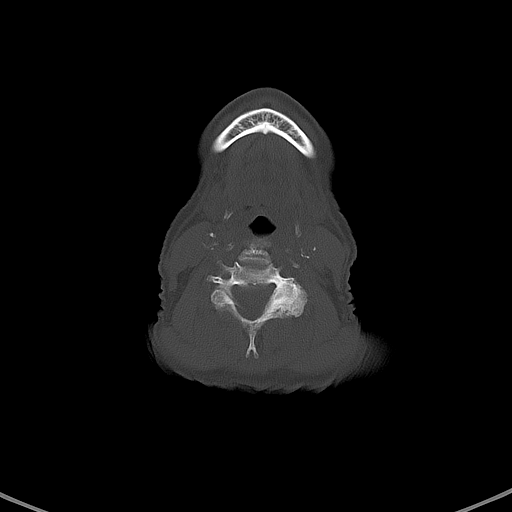

[Series 8: pet hn_sk_thigh nac · axial · 5.0mm · 4.07mm/px · z∈[-1264,-424]mm · 5 of 211 slices shown]
[im 1/211]
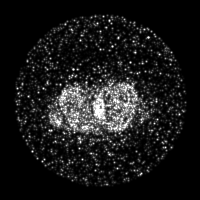
[im 53/211]
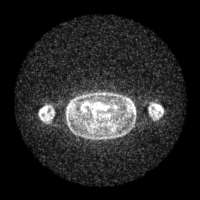
[im 106/211]
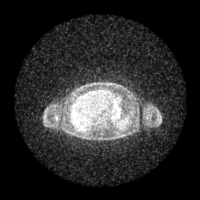
[im 158/211]
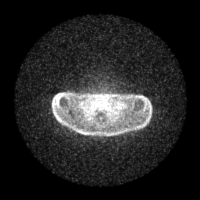
[im 211/211]
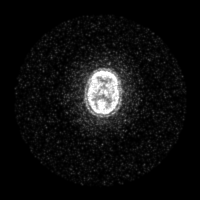

[Series 604: mip collection<mip range> · coronal · 1.74mm/px · 1 of 32 slices shown]
[im 1/32]
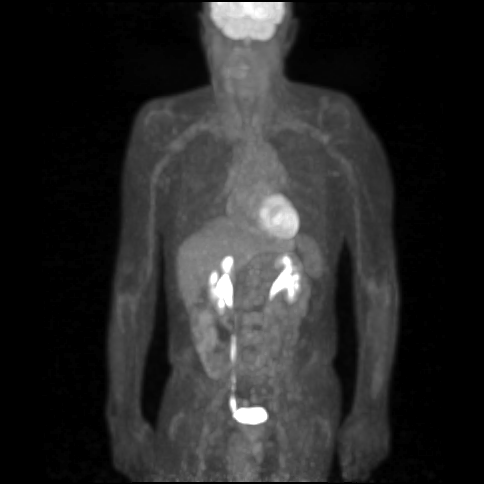

[Series 605: range-ct hn_sk_th 5.0 (id)<alpha range> · 2 of 77 slices shown (1 of 2)]
[im 1/77]
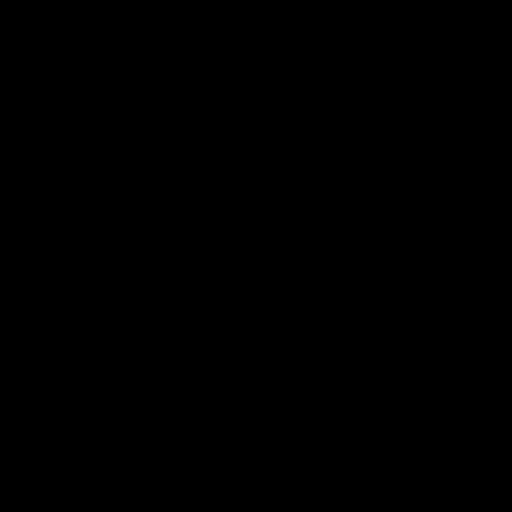
[im 77/77]
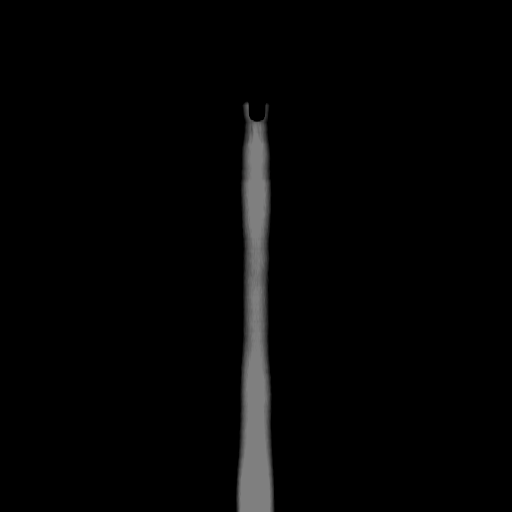

[Series 606: range-ct hn_sk_th 5.0 (id)<alpha range> · 5 of 195 slices shown (2 of 2)]
[im 1/195]
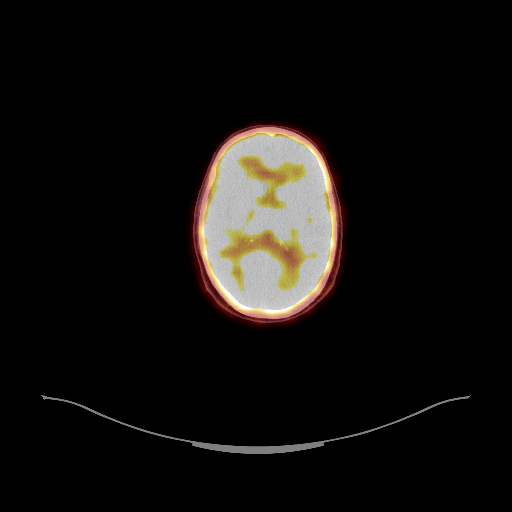
[im 49/195]
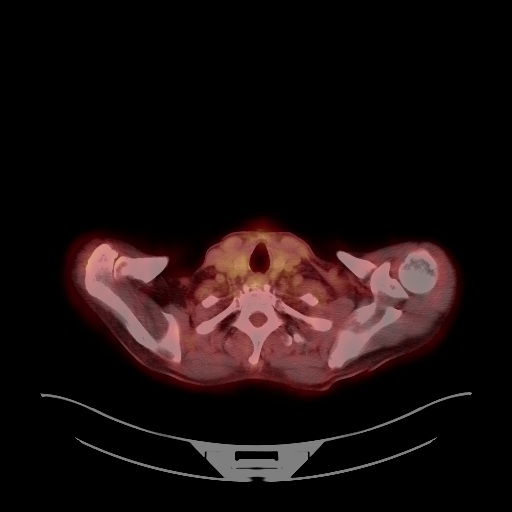
[im 98/195]
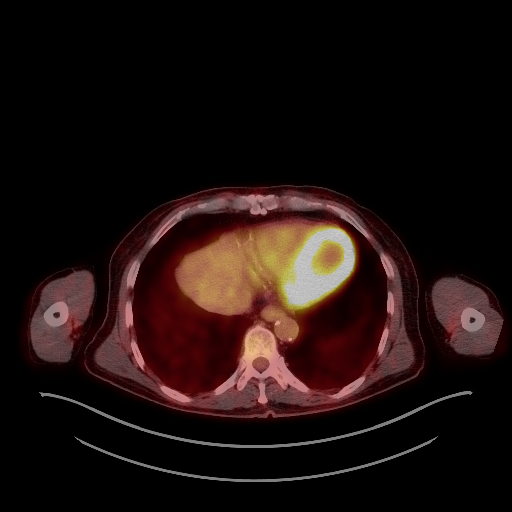
[im 146/195]
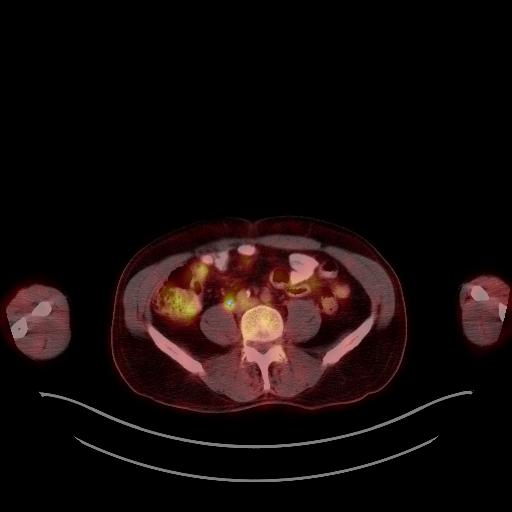
[im 195/195]
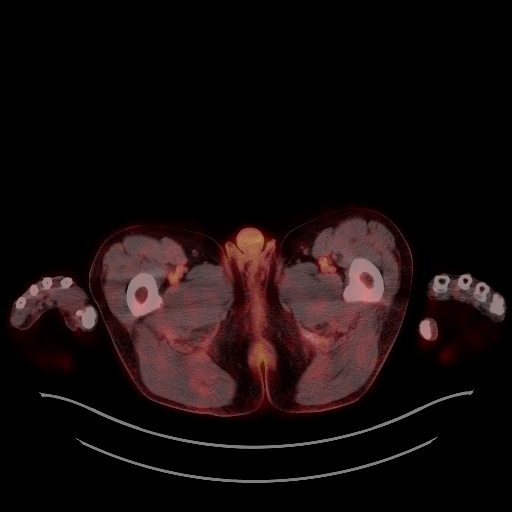

[25 of 25 positions shown; findings below may reference images not displayed]

FINDINGS: NECK

No residual hypermetabolic cervical lymph nodes identified.There are
no lesions of the pharyngeal mucosal space. Moderate diffuse
atherosclerosis of the carotid arteries noted. There is mild edema
throughout the subcutaneous fat attributed to prior radiation
therapy.

CHEST

There are no hypermetabolic mediastinal, hilar or axillary lymph
nodes. There is no abnormal pulmonary metabolic activity. The lungs
are stable with mild biapical scarring. Diffuse atherosclerosis of
the aorta, great vessels and coronary arteries noted. There is a
stable sebaceous cyst in the mid right back.

ABDOMEN/PELVIS

There is no hypermetabolic activity within the liver, adrenal
glands, spleen or pancreas. There is no hypermetabolic nodal
activity. Right renal cyst, nonobstructing left renal calculus,
aortoiliac atherosclerosis and prostatic dystrophic calcifications
are grossly stable.

SKELETON

There is no hypermetabolic activity to suggest osseous metastatic
disease.
IMPRESSION: 1. Interval resolution of hypermetabolic left cervical adenopathy.
No evidence of local recurrence.
2. No evidence of distant metastatic disease or new primary
malignancy in the chest.
3. Stable incidental findings as described.

## 2015-09-12 ENCOUNTER — Ambulatory Visit (HOSPITAL_COMMUNITY)
Admission: RE | Admit: 2015-09-12 | Discharge: 2015-09-12 | Disposition: A | Discharge status: Home / Self Care | Payer: Medicare Other | Source: Ambulatory Visit | Attending: Radiation Oncology | Admitting: Radiation Oncology

## 2015-09-12 DIAGNOSIS — R1310 Dysphagia, unspecified: Secondary | ICD-10-CM | POA: Diagnosis not present

## 2015-09-12 DIAGNOSIS — C090 Malignant neoplasm of tonsillar fossa: Secondary | ICD-10-CM | POA: Diagnosis not present

## 2015-09-12 DIAGNOSIS — R933 Abnormal findings on diagnostic imaging of other parts of digestive tract: Secondary | ICD-10-CM | POA: Diagnosis not present

## 2015-09-12 DIAGNOSIS — R938 Abnormal findings on diagnostic imaging of other specified body structures: Secondary | ICD-10-CM | POA: Insufficient documentation

## 2015-09-16 ENCOUNTER — Ambulatory Visit: Payer: Medicare Other | Attending: Radiation Oncology

## 2015-09-16 ENCOUNTER — Ambulatory Visit: Payer: Self-pay

## 2015-10-25 ENCOUNTER — Telehealth: Payer: Self-pay | Admitting: Family Medicine

## 2015-10-25 ENCOUNTER — Other Ambulatory Visit: Payer: Self-pay

## 2015-10-25 MED ORDER — ALPRAZOLAM 0.5 MG PO TABS: 0.5000 mg | ORAL_TABLET | Freq: Every evening | ORAL | Status: DC | PRN

## 2015-10-25 NOTE — Telephone Encounter (Signed)
Pt called and was wanting a refill on his xanax.  Pt uses them for anxiety attacks and used his last one this morning. Pt made a appt for a cpe on June 23rd pt uses [id number] - [place], [place] and pt can be reached at [phone] (M

## 2015-10-25 NOTE — Telephone Encounter (Signed)
Phoned in by [facility]

## 2015-10-25 NOTE — Telephone Encounter (Signed)
Okay to renew

## 2015-10-26 NOTE — Telephone Encounter (Signed)
Approved Per Cover my meds, Called pharmacy & pt picked up yesterday and paid cash

## 2015-11-11 ENCOUNTER — Encounter: Payer: Self-pay | Admitting: Family Medicine

## 2015-11-11 ENCOUNTER — Ambulatory Visit (INDEPENDENT_AMBULATORY_CARE_PROVIDER_SITE_OTHER): Payer: Medicare Other | Admitting: Family Medicine

## 2015-11-11 ENCOUNTER — Ambulatory Visit: Payer: Medicare Other | Admitting: Family Medicine

## 2015-11-11 VITALS — BP 120/80 | HR 68 | Ht 66.5 in | Wt 135.0 lb

## 2015-11-11 DIAGNOSIS — Z125 Encounter for screening for malignant neoplasm of prostate: Secondary | ICD-10-CM | POA: Diagnosis not present

## 2015-11-11 DIAGNOSIS — K219 Gastro-esophageal reflux disease without esophagitis: Secondary | ICD-10-CM

## 2015-11-11 DIAGNOSIS — Z87891 Personal history of nicotine dependence: Secondary | ICD-10-CM

## 2015-11-11 DIAGNOSIS — Z8249 Family history of ischemic heart disease and other diseases of the circulatory system: Secondary | ICD-10-CM

## 2015-11-11 DIAGNOSIS — I2510 Atherosclerotic heart disease of native coronary artery without angina pectoris: Secondary | ICD-10-CM | POA: Diagnosis not present

## 2015-11-11 DIAGNOSIS — Z0000 Encounter for general adult medical examination without abnormal findings: Secondary | ICD-10-CM

## 2015-11-11 DIAGNOSIS — C099 Malignant neoplasm of tonsil, unspecified: Secondary | ICD-10-CM

## 2015-11-11 DIAGNOSIS — Z23 Encounter for immunization: Secondary | ICD-10-CM | POA: Diagnosis not present

## 2015-11-11 DIAGNOSIS — E781 Pure hyperglyceridemia: Secondary | ICD-10-CM

## 2015-11-11 DIAGNOSIS — Z1159 Encounter for screening for other viral diseases: Secondary | ICD-10-CM

## 2015-11-11 DIAGNOSIS — M1990 Unspecified osteoarthritis, unspecified site: Secondary | ICD-10-CM | POA: Diagnosis not present

## 2015-11-11 DIAGNOSIS — Z889 Allergy status to unspecified drugs, medicaments and biological substances status: Secondary | ICD-10-CM | POA: Diagnosis not present

## 2015-11-11 DIAGNOSIS — Z789 Other specified health status: Secondary | ICD-10-CM

## 2015-11-11 DIAGNOSIS — I2583 Coronary atherosclerosis due to lipid rich plaque: Secondary | ICD-10-CM

## 2015-11-11 LAB — COMPREHENSIVE METABOLIC PANEL
ALT: 17 U/L (ref 9–46)
AST: 19 U/L (ref 10–35)
Albumin: 4.3 g/dL (ref 3.6–5.1)
Alkaline Phosphatase: 65 U/L (ref 40–115)
BUN: 12 mg/dL (ref 7–25)
CHLORIDE: 102 mmol/L (ref 98–110)
CO2: 27 mmol/L (ref 20–31)
CREATININE: 0.87 mg/dL (ref 0.70–1.25)
Calcium: 10 mg/dL (ref 8.6–10.3)
Glucose, Bld: 94 mg/dL (ref 65–99)
Potassium: 4.3 mmol/L (ref 3.5–5.3)
SODIUM: 138 mmol/L (ref 135–146)
Total Bilirubin: 0.7 mg/dL (ref 0.2–1.2)
Total Protein: 7.5 g/dL (ref 6.1–8.1)

## 2015-11-11 LAB — CBC WITH DIFFERENTIAL/PLATELET
BASOS ABS: 49 {cells}/uL (ref 0–200)
Basophils Relative: 1 %
EOS PCT: 1 %
Eosinophils Absolute: 49 cells/uL (ref 15–500)
HCT: 45.7 % (ref 38.5–50.0)
Hemoglobin: 15.7 g/dL (ref 13.2–17.1)
Lymphocytes Relative: 18 %
Lymphs Abs: 882 cells/uL (ref 850–3900)
MCH: 32.7 pg (ref 27.0–33.0)
MCHC: 34.4 g/dL (ref 32.0–36.0)
MCV: 95.2 fL (ref 80.0–100.0)
MONOS PCT: 12 %
MPV: 9.1 fL (ref 7.5–12.5)
Monocytes Absolute: 588 cells/uL (ref 200–950)
NEUTROS PCT: 68 %
Neutro Abs: 3332 cells/uL (ref 1500–7800)
PLATELETS: 246 10*3/uL (ref 140–400)
RBC: 4.8 MIL/uL (ref 4.20–5.80)
RDW: 13.6 % (ref 11.0–15.0)
WBC: 4.9 10*3/uL (ref 4.0–10.5)

## 2015-11-11 LAB — LIPID PANEL
CHOL/HDL RATIO: 5.5 ratio — AB (ref <=5.0)
Cholesterol: 252 mg/dL — ABNORMAL HIGH (ref 125–200)
HDL: 46 mg/dL (ref >=40)
Triglycerides: 589 mg/dL — ABNORMAL HIGH (ref <150)

## 2015-11-11 NOTE — Progress Notes (Signed)
Subjective:    Patient ID: Matthew Moreno, male    DOB: 10-02-1945, 70 y.o.   MRN: [id number]  HPI He is here for a complete examination. He does complain of some arthritis symptoms mainly in his neck and wrist but these go away fairly quickly. He has had difficulty with reflux disease and has been taking Tums. He states that he has is on a regular basis. He has had some swallowing difficulty and recently did a swallowing study. He is now following the recommendations from the swelling study. This was done by Dr. Isidore Moos for follow-up on the tonsil cancer. He did have radiation on this at this time he is doing fairly well. He does have a history of hypertriglyceridemia as well as coronary artery disease but does have statin intolerance. He is not taking any medications for this. He is a former smoker. He also notes that his younger brother at age 70 recently had CABG grafting done. Otherwise his social and family history as well as health maintenance and immunizations were reviewed. He does not complain of chest pain, abdominal pain, bladder issues.   Review of Systems  All other systems reviewed and are negative.      Objective:   Physical Exam BP 120/80 mmHg  Pulse 68  Ht 5' 6.5" (1.689 m)  Wt 135 lb (61.236 kg)  BMI 21.47 kg/m2  SpO2 98%  General Appearance:    Alert, cooperative, no distress, appears stated age  Head:    Normocephalic, without obvious abnormality, atraumatic  Eyes:    PERRL, conjunctiva/corneas clear, EOM's intact, fundi    benign  Ears:    Normal TM's and external ear canals  Nose:   Nares normal, mucosa normal, no drainage or sinus   tenderness  Throat:   Lips, mucosa, and tongue normal; teeth and gums normal  Neck:   Supple, no lymphadenopathy;  thyroid:  no   enlargement/tenderness/nodules; no carotid   bruit or JVD  Back:    Spine nontender, no curvature, ROM normal, no CVA     tenderness  Lungs:     Clear to auscultation bilaterally without wheezes,  rales or     ronchi; respirations unlabored  Chest Wall:    No tenderness or deformity   Heart:    Regular rate and rhythm, S1 and S2 normal, no murmur, rub   or gallop  Breast Exam:    No chest wall tenderness, masses or gynecomastia  Abdomen:     Soft, non-tender, nondistended, normoactive bowel sounds,    no masses, no hepatosplenomegaly        Extremities:   No clubbing, cyanosis or edema  Pulses:   2+ and symmetric all extremities  Skin:   Skin color, texture, turgor normal, no rashes or lesions  Lymph nodes:   Cervical, supraclavicular, and axillary nodes normal  Neurologic:   CNII-XII intact, normal strength, sensation and gait; reflexes 2+ and symmetric throughout          Psych:   Normal mood, affect, hygiene and grooming.          Assessment & Plan:  Routine general medical examination at a health care facility  Former smoker  Gastroesophageal reflux disease, esophagitis presence not specified  Hypertriglyceridemia - Plan: Lipid panel  Coronary artery disease due to lipid rich plaque - Plan: CBC with Differential/Platelet, Comprehensive metabolic panel, Lipid panel  Statin intolerance - Plan: Lipid panel  Tonsil cancer ([place])  Need for prophylactic vaccination against Streptococcus pneumoniae (  pneumococcus) - Plan: Pneumococcal conjugate vaccine 13-valent  Screening for prostate cancer - Plan: PSA, Medicare  Family history of heart disease - Plan: CBC with Differential/Platelet, Comprehensive metabolic panel, Lipid panel  Need for hepatitis C screening test - Plan: Hepatitis C antibody Overall he seems to be doing well. He does have an appointment scheduled with cardiology. I will see what his blood work shows. The cardiology note was reviewed and does indicate he might qualify for the PSK 9 discussed prostate cancer screening with him. Will also check hepatitis C Cuss using Nexium 40 mg daily for the next week or 2 to see if that'll quiet his symptoms down and  then possibly use it on the less frequent basis if he can get away with it. This might be difficult to his underlying tonsillar cancer and radiation as well as x-ray findings showing some arthritic changes.

## 2015-11-11 NOTE — Patient Instructions (Signed)
Take to Nexium daily for the next week or 2 to see if you can quiet down and then see if you can take it every other day then every third day

## 2015-11-12 LAB — HEPATITIS C ANTIBODY: HCV Ab: REACTIVE — AB

## 2015-11-13 ENCOUNTER — Ambulatory Visit (INDEPENDENT_AMBULATORY_CARE_PROVIDER_SITE_OTHER): Payer: Medicare Other | Admitting: Family Medicine

## 2015-11-13 DIAGNOSIS — R894 Abnormal immunological findings in specimens from other organs, systems and tissues: Secondary | ICD-10-CM

## 2015-11-13 DIAGNOSIS — E781 Pure hyperglyceridemia: Secondary | ICD-10-CM

## 2015-11-13 DIAGNOSIS — Z789 Other specified health status: Secondary | ICD-10-CM

## 2015-11-13 DIAGNOSIS — R768 Other specified abnormal immunological findings in serum: Secondary | ICD-10-CM

## 2015-11-13 LAB — HEPATITIS C RNA QUANTITATIVE: HCV QUANT: NOT DETECTED [IU]/mL (ref <15)

## 2015-11-13 MED ORDER — PITAVASTATIN CALCIUM 2 MG PO TABS: 1.0000 | ORAL_TABLET | Freq: Every day | ORAL | Status: DC

## 2015-11-13 NOTE — Progress Notes (Signed)
   Subjective:    Patient ID: Matthew Moreno, male    DOB: 07-21-1945, 70 y.o.   MRN: [id number]  HPI He is here for follow-up on his blood work. The hepatitis C antibody was positive. 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 He also has a history of statin intolerance. His last blood work prior to today actually had his numbers looking pretty good but this was right after he had radiation therapy and had a tremendous weight loss due to the underlying tonsillar cancer and radiation. Since that time he has gained several pounds back but did lose probably 30 pounds during that timeframe.   Review of Systems     Objective:   Physical Exam Alert and in no distress otherwise not examined       Assessment & Plan:  Hepatitis C antibody test positive - Plan: US Abdomen Complete, Alpha Fetoprotein (AFP) tumor marker, Hepatitis A antibody, total, Hepatitis B core antibody, total, Hepatitis B surface antibody, Hepatitis B surface antigen, Hepatitis C genotype, HEP C RNA GENOTYPE WITH REFLEX, HIV antibody, Liver Fibrosis, FibroTest-ActiTest, Protime-INR  Hypertriglyceridemia - Plan: Pitavastatin Calcium (LIVALO) 2 MG TABS  Statin intolerance  I discussed the hepatitis C and appropritate follow-up. Appropriate blood work will be ordered. He will then be referred to the hepatitis C clinic. I will also give him a sample of Livalo and he will let me how he tolerates this. If this doesn't work I can at least give him cholesyramine and fenofibrate.

## 2015-11-13 NOTE — Progress Notes (Signed)
   Subjective:    Patient ID: Matthew Moreno, male    DOB: 07-18-1945, 70 y.o.   MRN: [id number]  HPI    Review of Systems     Objective:   Physical Exam        Assessment & Plan:  His hepatitis C viral load is undetectable and therefore further evaluation is not needed. I relayed this information to him.

## 2015-11-20 ENCOUNTER — Telehealth: Payer: Self-pay | Admitting: Family Medicine

## 2015-11-20 DIAGNOSIS — E781 Pure hyperglyceridemia: Secondary | ICD-10-CM

## 2015-11-20 MED ORDER — PITAVASTATIN CALCIUM 2 MG PO TABS: 1.0000 | ORAL_TABLET | Freq: Every day | ORAL | Status: DC

## 2015-11-20 NOTE — Telephone Encounter (Signed)
Let him know that I called in a 90 day supply but I have no clue how his insurance will cover this. He might need a prior off since he is intolerant everything else. Warn him about this and have him let us know

## 2015-11-20 NOTE — Telephone Encounter (Signed)
Pt is aware.  

## 2015-11-20 NOTE — Telephone Encounter (Signed)
Pt said Livalo samples has been working well. He is taking the last sample this morning. Can script for Livalo be sent to pharmacy?

## 2015-11-21 ENCOUNTER — Telehealth: Payer: Self-pay | Admitting: Family Medicine

## 2015-11-21 NOTE — Telephone Encounter (Signed)
P.A. Chanda Busing completed

## 2015-11-22 NOTE — Telephone Encounter (Signed)
Give him samples. Lisfranc keep his costs down. There really is not another drug that he can take that he has not had trouble with.

## 2015-11-22 NOTE — Telephone Encounter (Signed)
Called pt & he states he can not afford $122 a month.  He wants to switch to something cheaper & also asked if you had samples for him to try  before he buys a new Rx to make sure he can take it

## 2015-11-22 NOTE — Telephone Encounter (Signed)
P.A. Approved 1 year effective today.  I called pharmacy went thru $122 for 30 days and $343 90 for days. States pt is in coverage gap

## 2015-11-25 ENCOUNTER — Ambulatory Visit (HOSPITAL_COMMUNITY): Payer: Medicare Other

## 2016-01-06 ENCOUNTER — Other Ambulatory Visit: Payer: Self-pay | Admitting: Internal Medicine

## 2016-01-06 NOTE — Telephone Encounter (Signed)
Rx request sent to pharmacy.  

## 2016-01-10 ENCOUNTER — Encounter: Payer: Self-pay | Admitting: Internal Medicine

## 2016-01-10 ENCOUNTER — Ambulatory Visit (INDEPENDENT_AMBULATORY_CARE_PROVIDER_SITE_OTHER): Payer: Medicare Other | Admitting: Internal Medicine

## 2016-01-10 ENCOUNTER — Encounter (INDEPENDENT_AMBULATORY_CARE_PROVIDER_SITE_OTHER): Payer: Self-pay

## 2016-01-10 VITALS — BP 138/76 | HR 66 | Ht 66.0 in | Wt 137.6 lb

## 2016-01-10 DIAGNOSIS — I2510 Atherosclerotic heart disease of native coronary artery without angina pectoris: Secondary | ICD-10-CM | POA: Diagnosis not present

## 2016-01-10 DIAGNOSIS — R0602 Shortness of breath: Secondary | ICD-10-CM | POA: Diagnosis not present

## 2016-01-10 DIAGNOSIS — R5383 Other fatigue: Secondary | ICD-10-CM | POA: Insufficient documentation

## 2016-01-10 DIAGNOSIS — E781 Pure hyperglyceridemia: Secondary | ICD-10-CM

## 2016-01-10 NOTE — Patient Instructions (Signed)
Your physician recommends that you return for lab work FASTING in 2-3 months to recheck cholesterol   Your physician has requested that you have en exercise stress myoview. For further information please visit [email or web address]. Please follow instruction sheet, as given.  Your physician wants you to follow-up in: ONE YEAR with Dr. Debara Pickett. You will receive a reminder letter in the mail two months in advance. If you don't receive a letter, please call our office to schedule the follow-up appointment.

## 2016-01-10 NOTE — Progress Notes (Signed)
Instructions given

## 2016-01-10 NOTE — Progress Notes (Signed)
OFFICE NOTE  Chief Complaint:  Dyspnea, fatigue  Primary Care Physician: [name], MD  HPI:  Matthew Moreno is a 70 year old gentleman we are following for history of coronary disease, moderate, in 2010 on catheterization, 50% LAD, 40% RCA and 50% OM lesion. He also has hypertension difficult to control, dyslipidemia and intolerance to statins. He has returned today with worsening leg pain which now he is attributing to fenofibrate. His most recent lipid profile obtained on April 11, 2012, showed total cholesterol 241, triglycerides 217, LDL was 158, HDL 40. His triglycerides are actually down from about 400 last year with the fenofibrate so that seems to be working. Unfortunately, he was directed to take Livalo 2 mg daily last year at his last visit but did not start that and is now contemplating discontinuing the fenofibrate as he is concerned this is causing his muscle pain. Since his last visit, he has stopped both the little and fenofibrate. Prior to his last blood work, he went out with some friends and was drinking bourbon in his triglycerides were 1400. Therefore difficult to know what his LDL actually is without measuring a direct LDL. We discussed options as alternatives to statins including WelChol and his idea, however he is not interested in any further medications.  Matthew Moreno has no new complaints today. 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 He is undergone radiation therapy and is apparently clear of this. Unfortunately he was intolerant to the statins and cannot afford to take fenofibrate. He is currently not on any therapy for lipid lowering and does have moderate coronary artery disease. We have discussed this in the past and felt there were a few additional options. He does say that he lost 20 or 30 pounds recently due to his cancer and wonders if his cholesterol is improved.  01/10/2016  Matthew Moreno returns today for  follow-up. He seems to be doing fairly well although recently he's had some worsening shortness of breath and fatigue. This is mostly with exertion. 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 Several days he's had to go back home and go to sleep for the rest of the day. He does not associate this with the hot weather. He denies any chest pain with this but has been more fatigued recently. He is concern is a friend of his had some similar symptoms and underwent a bypass surgery. Matthew Moreno does have a history of moderate coronary artery disease by catheterization in 2010. He's had difficult to control cholesterol due to intolerance to statins but has been able to take Livalo. Recent lab work indicated much higher triglycerides been previously, in fact greater than 400 therefore LDL was not calculated, however LDL last year was 102.  PMHx:  Past Medical History:  Diagnosis Date  . CAD (coronary artery disease) 2008  . Dyslipidemia   . Family history of heart disease   . Hypertension   . Nephrolithiasis    H/O multiple "kidney stones", Lithotripsies, J-Stents  . Panic attacks   . S/P radiation therapy 02/12/2014-04/02/2014   Left tonsil and bilateral neck / 70 Gy in 35 fractions to gross disease, 63 Gy in 35 fractions to high risk nodalechelons, and 56 Gy in 35 fractions to intermediate risk nodal echelons   . Statin intolerance   . Tonsil cancer ([place]) 01/05/14   left    Past Surgical History:  Procedure Laterality Date  . BICEPS TENDON REPAIR Left   .  CARDIAC CATHETERIZATION  06/19/2008   normal left main, Cfx with 3 OMs (50% osital narrowing in OM1), LAD densely calcified in the proximal half of vessel with eccentric narrowing in mid region (40-50% severity), diag 1 & 2 free of disease, RCA with less than 30% narrowing in midportion (Dr. Loni Muse. Little)  . COLONOSCOPY  2010   Dr. Ardis Hughs    FAMHx:  Family History  Problem Relation Age of Onset  . Heart Problems  Mother   . Heart Problems Father     SOCHx:   reports that he quit smoking about 32 years ago. His smoking use included Cigarettes. He has a 15.00 pack-year smoking history. He has never used smokeless tobacco. He reports that he drinks about 1.8 oz of alcohol per week . He reports that he does not use drugs.  ALLERGIES:  Allergies  Allergen Reactions  . Bystolic [Nebivolol Hcl]   . Lipitor [Atorvastatin] Other (See Comments)    Muscle aches with statins  . Lisinopril   . Metoprolol   . Pravastatin   . Red Yeast Rice [Cholestin]   . Sulfonamide Derivatives     REACTION: hives  . Zocor [Simvastatin]     ROS: Pertinent items noted in HPI and remainder of comprehensive ROS otherwise negative.  HOME MEDS: Current Outpatient Prescriptions  Medication Sig Dispense Refill  . ALPRAZolam (XANAX) 0.5 MG tablet Take 1 tablet (0.5 mg total) by mouth at bedtime as needed. 30 tablet 4  . aspirin 81 MG tablet Take 160 mg by mouth daily.    . calcium carbonate (TUMS EX) 750 MG chewable tablet Chew 1 tablet by mouth daily.    Marland Kitchen GARLIC PO Take 1 capsule by mouth daily. Reported on 11/11/2015    . metoprolol tartrate (LOPRESSOR) 25 MG tablet Take 0.5 tablets (12.5 mg total) by mouth 2 (two) times daily. Please schedule appointment for refills. 30 tablet 3  . Multiple Vitamins-Minerals (MULTIVITAMIN WITH MINERALS) tablet Take 1 tablet by mouth daily. Reported on 11/11/2015    . Omega-3 Fatty Acids (FISH OIL) 1000 MG CAPS Take 1,000 mg by mouth 2 (two) times daily. Reported on 11/11/2015    . Pitavastatin Calcium (LIVALO) 2 MG TABS Take 1 tablet (2 mg total) by mouth daily. 90 tablet 3  . sodium fluoride (FLUORISHIELD) 1.1 % GEL dental gel Instill 1 drop into each tooth space of fluoride tray. Place over teeth for 5 minutes. Remove. Spit out excess. Repeat nightly. 120 mL prn   No current facility-administered medications for this visit.    Facility-Administered Medications Ordered in Other Visits    Medication Dose Route Frequency Provider Last Rate Last Dose  . influenza  inactive virus vaccine (FLUZONE/FLUARIX) injection 0.5 mL  0.5 mL Intramuscular Once Denita Lung, MD        LABS/IMAGING: No results found for this or any previous visit (from the past 48 hour(s)). No results found.  VITALS: BP 138/76   Pulse 66   Ht 5\' 6"  (1.676 m)   Wt 137 lb 9.6 oz (62.4 kg)   BMI 22.21 kg/m   EXAM: General appearance: alert and no distress Neck: no adenopathy, no carotid bruit, no JVD, supple, symmetrical, trachea midline and thyroid not enlarged, symmetric, no tenderness/mass/nodules Lungs: clear to auscultation bilaterally Heart: regular rate and rhythm, S1, S2 normal, no murmur, click, rub or gallop Abdomen: soft, non-tender; bowel sounds normal; no masses,  no organomegaly Extremities: extremities normal, atraumatic, no cyanosis or edema Pulses: 2+ and symmetric Skin:  Skin color, texture, turgor normal. No rashes or lesions Neurologic: Grossly normal Psych: Mood, affect normal  EKG: Normal sinus rhythm at 66  ASSESSMENT: 1. Moderate nonobstructive coronary artery disease (2010 cath) 2. Hypertension 3. Dyslipidemia - on Livalo 4. Recent tonsillar cancer 5. Fatigue/DOE  PLAN: 1.   Matthew Moreno has recently been having some fatigue and exertional dyspnea. This was mostly when doing activities outside however he does not think that the heat has been causing her symptoms. He's concerned about worsening coronary artery disease. I like for him to undergo an exercise Myoview given his history of moderate nonobstructive coronary disease by cath in 2010, it may have progressed. Blood pressure is at goal. I will re-assess cholesterol in 3 months with a Direct LDL as well.  Follow-up with me afterwards to review his stress test results.  Pixie Casino, MD, [facility] Attending Cardiologist [place] C Timberlyn Pickford 01/10/2016, 9:58 AM

## 2016-01-13 ENCOUNTER — Telehealth: Payer: Self-pay | Admitting: Family Medicine

## 2016-01-13 ENCOUNTER — Other Ambulatory Visit: Payer: Self-pay

## 2016-01-13 NOTE — Telephone Encounter (Signed)
Med was already sent to pharmacy

## 2016-01-13 NOTE — Telephone Encounter (Signed)
Is this okay if we have them

## 2016-01-13 NOTE — Telephone Encounter (Signed)
Give him some but also call it in

## 2016-01-13 NOTE — Telephone Encounter (Signed)
I have put samples up front and sent med in

## 2016-01-13 NOTE — Telephone Encounter (Signed)
Pt called to see if he could get more samples of Livalo 2 mg. He only has 2 pills left

## 2016-01-14 ENCOUNTER — Telehealth (HOSPITAL_COMMUNITY): Payer: Self-pay | Admitting: *Deleted

## 2016-01-14 NOTE — Telephone Encounter (Signed)
Close encounter 

## 2016-01-16 ENCOUNTER — Ambulatory Visit (HOSPITAL_COMMUNITY)
Admission: RE | Admit: 2016-01-16 | Discharge: 2016-01-16 | Disposition: A | Discharge status: Home / Self Care | Payer: Medicare Other | Source: Ambulatory Visit | Attending: Cardiology | Admitting: Cardiology

## 2016-01-16 DIAGNOSIS — I10 Essential (primary) hypertension: Secondary | ICD-10-CM | POA: Insufficient documentation

## 2016-01-16 DIAGNOSIS — R0609 Other forms of dyspnea: Secondary | ICD-10-CM | POA: Insufficient documentation

## 2016-01-16 DIAGNOSIS — R5383 Other fatigue: Secondary | ICD-10-CM | POA: Insufficient documentation

## 2016-01-16 DIAGNOSIS — R0602 Shortness of breath: Secondary | ICD-10-CM | POA: Insufficient documentation

## 2016-01-16 DIAGNOSIS — I2510 Atherosclerotic heart disease of native coronary artery without angina pectoris: Secondary | ICD-10-CM

## 2016-01-16 DIAGNOSIS — Z87891 Personal history of nicotine dependence: Secondary | ICD-10-CM | POA: Insufficient documentation

## 2016-01-16 DIAGNOSIS — Z8249 Family history of ischemic heart disease and other diseases of the circulatory system: Secondary | ICD-10-CM | POA: Diagnosis not present

## 2016-01-16 MED ORDER — TECHNETIUM TC 99M TETROFOSMIN IV KIT
31.8000 | PACK | Freq: Once | INTRAVENOUS | Status: AC | PRN
  Administered 2016-01-16: 31.8 via INTRAVENOUS
  Filled 2016-01-16: qty 32

## 2016-01-16 MED ORDER — TECHNETIUM TC 99M TETROFOSMIN IV KIT
10.8000 | PACK | Freq: Once | INTRAVENOUS | Status: AC | PRN
  Administered 2016-01-16: 10.8 via INTRAVENOUS
  Filled 2016-01-16: qty 11

## 2016-01-17 LAB — MYOCARDIAL PERFUSION IMAGING
CHL CUP MPHR: 151 {beats}/min
CHL CUP NUCLEAR SDS: 0
CHL RATE OF PERCEIVED EXERTION: 17
CSEPED: 4 min
Estimated workload: 6.4 METS
Exercise duration (sec): 30 s
LV dias vol: 85 mL (ref 62–150)
LVSYSVOL: 44 mL
NUC STRESS TID: 1.21
Peak HR: 151 {beats}/min
Percent HR: 100 %
Rest HR: 76 {beats}/min
SRS: 3
SSS: 3

## 2016-01-21 ENCOUNTER — Other Ambulatory Visit: Payer: Self-pay | Admitting: *Deleted

## 2016-01-21 DIAGNOSIS — R931 Abnormal findings on diagnostic imaging of heart and coronary circulation: Secondary | ICD-10-CM

## 2016-01-21 DIAGNOSIS — I2510 Atherosclerotic heart disease of native coronary artery without angina pectoris: Secondary | ICD-10-CM

## 2016-01-23 ENCOUNTER — Other Ambulatory Visit: Payer: Self-pay

## 2016-01-23 ENCOUNTER — Ambulatory Visit (HOSPITAL_COMMUNITY): Payer: Medicare Other | Attending: Cardiology

## 2016-01-23 DIAGNOSIS — R931 Abnormal findings on diagnostic imaging of heart and coronary circulation: Secondary | ICD-10-CM | POA: Insufficient documentation

## 2016-01-23 DIAGNOSIS — I2510 Atherosclerotic heart disease of native coronary artery without angina pectoris: Secondary | ICD-10-CM | POA: Diagnosis not present

## 2016-01-23 DIAGNOSIS — Z8249 Family history of ischemic heart disease and other diseases of the circulatory system: Secondary | ICD-10-CM | POA: Insufficient documentation

## 2016-01-23 DIAGNOSIS — E785 Hyperlipidemia, unspecified: Secondary | ICD-10-CM | POA: Insufficient documentation

## 2016-01-23 DIAGNOSIS — Z87891 Personal history of nicotine dependence: Secondary | ICD-10-CM | POA: Insufficient documentation

## 2016-01-23 DIAGNOSIS — I351 Nonrheumatic aortic (valve) insufficiency: Secondary | ICD-10-CM | POA: Insufficient documentation

## 2016-02-21 ENCOUNTER — Other Ambulatory Visit (INDEPENDENT_AMBULATORY_CARE_PROVIDER_SITE_OTHER): Payer: Medicare Other

## 2016-02-21 DIAGNOSIS — Z23 Encounter for immunization: Secondary | ICD-10-CM | POA: Diagnosis not present

## 2016-03-04 ENCOUNTER — Telehealth: Payer: Self-pay | Admitting: Family Medicine

## 2016-03-04 NOTE — Telephone Encounter (Signed)
Pt given samples Livalo 2 mg #21 (ok'd previously by Dr. Redmond School)

## 2016-03-20 ENCOUNTER — Telehealth: Payer: Self-pay | Admitting: Family Medicine

## 2016-03-20 NOTE — Telephone Encounter (Signed)
Called pt samples Livalo ok per Dr. Redmond School

## 2016-03-27 NOTE — Progress Notes (Signed)
Mr. Matthew Moreno is here for follow up of radiation completed 04/02/2014 to his Left Tonsil and bilateral neck.  Pain issues, if any: He reports chronic pain in his posterior neck for many years. He does not take anything for this.  Using a feeding tube?: No Weight changes, if any: He reports a stable weight over the last year.  Wt Readings from Last 3 Encounters:  04/03/16 136 lb 3.2 oz (61.8 kg)  01/16/16 137 lb (62.1 kg)  01/10/16 137 lb 9.6 oz (62.4 kg)   Swallowing issues, if any: He denies swallowing issues. He will drink water while eating to help with his dry throat.  Smoking or chewing tobacco? No Using fluoride trays daily? Yes Last ENT visit was on: Dr. Lucia Gaskins 04/2015 Other notable issues, if any:  He reports a decreased appetite. He cannot eat as much at one time like he used to do before his tonsil cancer diagnosis.   BP (!) 154/74   Pulse 83   Temp 98.1 F (36.7 C)   Ht 5\' 6"  (1.676 m)   Wt 136 lb 3.2 oz (61.8 kg)   SpO2 100% Comment: room air  BMI 21.98 kg/m

## 2016-04-03 ENCOUNTER — Encounter: Payer: Self-pay | Admitting: Radiation Oncology

## 2016-04-03 ENCOUNTER — Telehealth: Payer: Self-pay | Admitting: *Deleted

## 2016-04-03 ENCOUNTER — Ambulatory Visit
Admission: RE | Admit: 2016-04-03 | Discharge: 2016-04-03 | Disposition: A | Discharge status: Home / Self Care | Payer: Medicare Other | Source: Ambulatory Visit | Attending: Radiation Oncology | Admitting: Radiation Oncology

## 2016-04-03 VITALS — BP 154/74 | HR 83 | Temp 98.1°F | Ht 66.0 in | Wt 136.2 lb

## 2016-04-03 DIAGNOSIS — Z08 Encounter for follow-up examination after completed treatment for malignant neoplasm: Secondary | ICD-10-CM | POA: Insufficient documentation

## 2016-04-03 DIAGNOSIS — Z7982 Long term (current) use of aspirin: Secondary | ICD-10-CM | POA: Insufficient documentation

## 2016-04-03 DIAGNOSIS — G8929 Other chronic pain: Secondary | ICD-10-CM | POA: Insufficient documentation

## 2016-04-03 DIAGNOSIS — Z882 Allergy status to sulfonamides status: Secondary | ICD-10-CM | POA: Insufficient documentation

## 2016-04-03 DIAGNOSIS — C090 Malignant neoplasm of tonsillar fossa: Secondary | ICD-10-CM

## 2016-04-03 DIAGNOSIS — Z8589 Personal history of malignant neoplasm of other organs and systems: Secondary | ICD-10-CM | POA: Insufficient documentation

## 2016-04-03 DIAGNOSIS — Z1329 Encounter for screening for other suspected endocrine disorder: Secondary | ICD-10-CM | POA: Insufficient documentation

## 2016-04-03 DIAGNOSIS — Z923 Personal history of irradiation: Secondary | ICD-10-CM | POA: Insufficient documentation

## 2016-04-03 DIAGNOSIS — M542 Cervicalgia: Secondary | ICD-10-CM | POA: Diagnosis not present

## 2016-04-03 DIAGNOSIS — R635 Abnormal weight gain: Secondary | ICD-10-CM

## 2016-04-03 DIAGNOSIS — Z79899 Other long term (current) drug therapy: Secondary | ICD-10-CM | POA: Diagnosis not present

## 2016-04-03 DIAGNOSIS — Z888 Allergy status to other drugs, medicaments and biological substances status: Secondary | ICD-10-CM | POA: Insufficient documentation

## 2016-04-03 DIAGNOSIS — Z85818 Personal history of malignant neoplasm of other sites of lip, oral cavity, and pharynx: Secondary | ICD-10-CM | POA: Diagnosis not present

## 2016-04-03 LAB — TSH: TSH: 1.195 m(IU)/L (ref 0.320–4.118)

## 2016-04-03 NOTE — Telephone Encounter (Signed)
CALLED PATIENT TO INFORM OF [employer] APPT. ON 04-02-17 2 2  PM WITH GRETCHEN DAWSON AND HIS LAB ON 04-02-17 @ 2:30 PM , SPOKE WITH PATIENT AND HE IS AWARE OF THESE APPTS.

## 2016-04-03 NOTE — Progress Notes (Signed)
Radiation Oncology         [phone] ________________________________  Name: Matthew Moreno MRN: [id number]  Date: 04/03/2016  DOB: [birth year]  Follow-Up Visit Note  CC: Matthew Haste, MD  Matthew Moreno, *  Diagnosis and Prior Radiotherapy:       ICD-9-CM ICD-10-CM   1. Screening for hypothyroidism V77.0 Z13.29 TSH  2. 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  Narrative:  The patient returns today for routine follow-up.  He reports chronic pain in his posterior neck for many years; he reports not taking any medication for this currently.  The patient is not currently using a feeding tube.   He denies any issues swallowing, though he reports he will drink water while eating to help with his dry throat.   The patient denies smoking or chewing tobacco.   He reports using fluoride trays daily.   His last ENT visit was 04/2015 with Dr. Lucia Gaskins.   The patient reports a stable weight over the last year.   The patient reports a decreased appetite. He doesn't eat as much at one time as he once could before his tonsil cancer diagnosis.  ALLERGIES:  is allergic to bystolic [nebivolol hcl]; lipitor [atorvastatin]; lisinopril; pravastatin; red yeast rice [cholestin]; sulfonamide derivatives; and zocor [simvastatin].  Meds: Current Outpatient Prescriptions  Medication Sig Dispense Refill  . ALPRAZolam (XANAX) 0.5 MG tablet Take 1 tablet (0.5 mg total) by mouth at bedtime as needed. 30 tablet 4  . aspirin 81 MG tablet Take 160 mg by mouth daily.    . calcium carbonate (TUMS EX) 750 MG chewable tablet Chew 1 tablet by mouth daily.    . metoprolol tartrate (LOPRESSOR) 25 MG tablet Take 0.5 tablets (12.5 mg total) by mouth 2 (two) times daily. Please  schedule appointment for refills. 30 tablet 3  . Multiple Vitamins-Minerals (MULTIVITAMIN WITH MINERALS) tablet Take 1 tablet by mouth daily. Reported on 11/11/2015    . Pitavastatin Calcium (LIVALO) 2 MG TABS Take 1 tablet (2 mg total) by mouth daily. 90 tablet 3  . sodium fluoride (FLUORISHIELD) 1.1 % GEL dental gel Instill 1 drop into each tooth space of fluoride tray. Place over teeth for 5 minutes. Remove. Spit out excess. Repeat nightly. 120 mL prn   No current facility-administered medications for this encounter.    Facility-Administered Medications Ordered in Other Encounters  Medication Dose Route Frequency Provider Last Rate Last Dose  . influenza  inactive virus vaccine (FLUZONE/FLUARIX) injection 0.5 mL  0.5 mL Intramuscular Once Denita Lung, MD        Physical Findings: The patient is in no acute distress. Patient is alert and oriented. Wt Readings from Last 3 Encounters:  04/03/16 136 lb 3.2 oz (61.8 kg)  01/16/16 137 lb (62.1 kg)  01/10/16 137 lb 9.6 oz (62.4 kg)    height is 5\' 6"  (1.676 m) and weight is 136 lb 3.2 oz (61.8 kg). His temperature is 98.1 F (36.7 C). His blood pressure is 154/74 (abnormal) and his pulse is 83. His oxygen saturation is 100%. .  General: Alert and oriented, in no acute distress HEENT: Head is normocephalic. Dentures were removed for exam. Extraocular movements are intact. Mucous membranes are moist. No lesions in the oral cavity Neck: Neck  is unremarkable. No palpable masses in the pre or posterior auricular regions or the cervical or supraclavicular regions. Skin: Very healthy over the neck. Heart: Regular in rate and rhythm with no murmurs, rubs, or gallops.  Chest: Clear to auscultation bilaterally, with no rhonchi, wheezes, or rales.  Abdomen: Soft, nontender, nondistended, with no rigidity or guarding. Extremities: No cyanosis or edema. Lymphatics: see Neck Exam Psychiatric: Judgment and insight are intact. Affect is  appropriate.  Lab Findings: Lab Results  Component Value Date   WBC 4.9 11/11/2015   HGB 15.7 11/11/2015   HCT 45.7 11/11/2015   MCV 95.2 11/11/2015   PLT 246 11/11/2015    Lab Results  Component Value Date   TSH 1.195 04/03/2016    Radiographic Findings: No results found.  Impression/Plan:    1) Head and Neck Cancer Status: No evidence of recurrence.  2) Nutritional Status: Good PEG tube: No  3) Risk Factors: The patient has been educated about risk factors including tobacco abuse; they understand that avoidance of tobacco is important to prevent recurrences as well as other cancers. Abstains from tobacco.  4) Swallowing: No difficulty reported.  5) Dental: Encouraged to continue regular followup with dentistry, and dental hygiene including fluoride rinses.  6) Thyroid function: WNL, recheck yearly Lab Results  Component Value Date   TSH 1.195 04/03/2016    7) Other: Patient reports chronic neck pain preceding cancer diagnosis but declines any intervention at this time.  8) Follow-up in 12 months. The patient was encouraged to call with any issues or questions before then. I will have the patient follow up with Mike Craze, Survivorship NP, on his next follow up visit. I recommend the patient continues to see Dr. Lucia Gaskins once a year for a regular follow up; we will reach out to his office about scheduling.  _____________________________________   Eppie Gibson, MD  This document serves as a record of services personally performed by Eppie Gibson, MD. It was created on her behalf by Maryla Morrow, a trained medical scribe. The creation of this record is based on the scribe's personal observations and the provider's statements to them. This document has been checked and approved by the attending provider.

## 2016-04-06 ENCOUNTER — Encounter: Payer: Self-pay | Admitting: *Deleted

## 2016-04-06 ENCOUNTER — Telehealth: Payer: Self-pay | Admitting: *Deleted

## 2016-04-06 NOTE — Telephone Encounter (Signed)
Oncology Nurse Navigator Documentation  Per Dr. Pearlie Oyster guidance, called ENT Dr. Pollie Friar office to arrange appt.   Spoke with Day, requested patient be contacted and appt arranged for routine follow-up with Dr. Lucia Gaskins in 6 months.  She verbalized understanding.  Gayleen Orem, RN, BSN, [place] [facility] [place] [phone]

## 2016-04-08 NOTE — Progress Notes (Signed)
A user error has taken place: encounter opened in error, closed for administrative reasons.

## 2016-05-03 ENCOUNTER — Other Ambulatory Visit: Payer: Self-pay | Admitting: Internal Medicine

## 2016-05-04 DIAGNOSIS — E781 Pure hyperglyceridemia: Secondary | ICD-10-CM | POA: Diagnosis not present

## 2016-05-05 LAB — LIPID PANEL
Cholesterol: 170 mg/dL (ref <200)
HDL: 40 mg/dL — ABNORMAL LOW (ref >40)
TRIGLYCERIDES: 407 mg/dL — AB (ref <150)
Total CHOL/HDL Ratio: 4.3 Ratio (ref <5.0)

## 2016-05-05 LAB — LDL CHOLESTEROL, DIRECT: LDL DIRECT: 67 mg/dL (ref <130)

## 2016-05-06 ENCOUNTER — Encounter: Payer: Self-pay | Admitting: Internal Medicine

## 2016-05-07 ENCOUNTER — Telehealth: Payer: Self-pay | Admitting: Family Medicine

## 2016-05-07 ENCOUNTER — Other Ambulatory Visit: Payer: Self-pay

## 2016-05-07 DIAGNOSIS — E781 Pure hyperglyceridemia: Secondary | ICD-10-CM

## 2016-05-07 MED ORDER — PITAVASTATIN CALCIUM 2 MG PO TABS: 1.0000 | ORAL_TABLET | Freq: Every day | ORAL | 0 refills | Status: DC

## 2016-05-07 NOTE — Telephone Encounter (Signed)
Pt requested more samples of Livalo

## 2016-05-07 NOTE — Telephone Encounter (Signed)
Pt informed samples are up front

## 2016-05-14 ENCOUNTER — Encounter: Payer: Self-pay | Admitting: Family Medicine

## 2016-05-14 ENCOUNTER — Ambulatory Visit (INDEPENDENT_AMBULATORY_CARE_PROVIDER_SITE_OTHER): Payer: Medicare Other | Admitting: Family Medicine

## 2016-05-14 VITALS — BP 130/88 | HR 72 | Wt 141.0 lb

## 2016-05-14 DIAGNOSIS — H6122 Impacted cerumen, left ear: Secondary | ICD-10-CM | POA: Diagnosis not present

## 2016-05-14 NOTE — Progress Notes (Signed)
   Subjective:    Patient ID: Matthew Moreno, male    DOB: 23-Aug-1945, 70 y.o.   MRN: [id number]  HPI He complains of difficulty hearing from the left ear. He did have URI symptoms last week however the difficulty with left ear hearing has continued.   Review of Systems     Objective:   Physical Exam Alert and in no distress. Tympanic membranes and canals are normal after cerumen was removed from the left ear canal without difficulty.. Pharyngeal area is normal. Neck is supple without adenopathy or thyromegaly. Cardiac exam shows a regular sinus rhythm without murmurs or gallops. Lungs are clear to auscultation.        Assessment & Plan:  Impacted cerumen of left ear No further intervention needed.

## 2016-06-01 ENCOUNTER — Other Ambulatory Visit: Payer: Self-pay | Admitting: Internal Medicine

## 2016-06-10 ENCOUNTER — Telehealth: Payer: Self-pay | Admitting: Family Medicine

## 2016-06-10 NOTE — Telephone Encounter (Signed)
Gave pt samples of livalo ok per [employer]

## 2016-06-29 ENCOUNTER — Other Ambulatory Visit: Payer: Self-pay | Admitting: Internal Medicine

## 2016-07-13 ENCOUNTER — Telehealth: Payer: Self-pay | Admitting: Family Medicine

## 2016-07-13 DIAGNOSIS — E781 Pure hyperglyceridemia: Secondary | ICD-10-CM

## 2016-07-13 MED ORDER — PITAVASTATIN CALCIUM 2 MG PO TABS: 1.0000 | ORAL_TABLET | Freq: Every day | ORAL | 0 refills | Status: DC

## 2016-07-13 NOTE — Telephone Encounter (Signed)
Pt called for samples Livalo, #3 packs of 2mg  given & #3 packs 4mg  given pt understands to cut in half   (ok to give samples per Dr. Redmond School)

## 2016-10-15 DIAGNOSIS — J029 Acute pharyngitis, unspecified: Secondary | ICD-10-CM | POA: Diagnosis not present

## 2016-10-26 ENCOUNTER — Other Ambulatory Visit: Payer: Self-pay | Admitting: *Deleted

## 2016-10-26 MED ORDER — METOPROLOL TARTRATE 25 MG PO TABS: ORAL_TABLET | ORAL | 3 refills | Status: DC

## 2016-10-30 ENCOUNTER — Telehealth: Payer: Self-pay | Admitting: Internal Medicine

## 2016-10-30 ENCOUNTER — Other Ambulatory Visit: Payer: Self-pay

## 2016-10-30 MED ORDER — METOPROLOL TARTRATE 25 MG PO TABS: ORAL_TABLET | ORAL | 3 refills | Status: DC

## 2016-10-30 NOTE — Telephone Encounter (Signed)
New Message   *STAT* If patient is at the pharmacy, call can be transferred to refill team.   1. Which medications need to be refilled? (please list name of each medication and dose if known) Metoprolol 25mg    2. Which pharmacy/location (including street and city if local pharmacy) is medication to be sent to? [facility]   3. Do they need a 30 day or 90 day supply? [place]

## 2016-10-30 NOTE — Telephone Encounter (Signed)
Patient called seeking refill request for Metoprolol I Called the patients pharmacy and the pharmacist stated that the rx was there and was received Monday. Patient directly notified.

## 2016-10-30 NOTE — Telephone Encounter (Signed)
Received call from [facility] stating that patient was there seeking refill request for Metoprolol. I informed pharmacist that I spoke with patient and he requested [facility]. The pharmacist asked patient had he used that pharmacy before and he stated no. Rx sent to [facility] per request.

## 2016-11-03 ENCOUNTER — Telehealth: Payer: Self-pay | Admitting: Family Medicine

## 2016-11-03 DIAGNOSIS — E781 Pure hyperglyceridemia: Secondary | ICD-10-CM

## 2016-11-03 MED ORDER — PITAVASTATIN CALCIUM 2 MG PO TABS: 1.0000 | ORAL_TABLET | Freq: Every day | ORAL | 0 refills | Status: DC

## 2016-11-03 NOTE — Telephone Encounter (Signed)
Called pt & informed samples of Livalo 2mg  ready for pick up

## 2017-01-11 ENCOUNTER — Encounter: Payer: Self-pay | Admitting: Internal Medicine

## 2017-01-11 ENCOUNTER — Other Ambulatory Visit: Payer: Self-pay

## 2017-01-11 ENCOUNTER — Ambulatory Visit (INDEPENDENT_AMBULATORY_CARE_PROVIDER_SITE_OTHER): Payer: Medicare Other | Admitting: Internal Medicine

## 2017-01-11 ENCOUNTER — Telehealth: Payer: Self-pay

## 2017-01-11 ENCOUNTER — Telehealth: Payer: Self-pay | Admitting: Family Medicine

## 2017-01-11 VITALS — BP 166/80 | HR 68 | Ht 65.0 in | Wt 152.0 lb

## 2017-01-11 DIAGNOSIS — I2510 Atherosclerotic heart disease of native coronary artery without angina pectoris: Secondary | ICD-10-CM

## 2017-01-11 DIAGNOSIS — E781 Pure hyperglyceridemia: Secondary | ICD-10-CM | POA: Diagnosis not present

## 2017-01-11 DIAGNOSIS — I10 Essential (primary) hypertension: Secondary | ICD-10-CM | POA: Diagnosis not present

## 2017-01-11 MED ORDER — PITAVASTATIN CALCIUM 2 MG PO TABS: 1.0000 | ORAL_TABLET | Freq: Every day | ORAL | 5 refills | Status: DC

## 2017-01-11 NOTE — Progress Notes (Signed)
OFFICE NOTE  Chief Complaint:  No complaints  Primary Care Physician: Denita Lung, MD  HPI:  Matthew Moreno is a 71 year old gentleman we are following for history of coronary disease, moderate, in 2010 on catheterization, 50% LAD, 40% RCA and 50% OM lesion. He also has hypertension difficult to control, dyslipidemia and intolerance to statins. He has returned today with worsening leg pain which now he is attributing to fenofibrate. His most recent lipid profile obtained on April 11, 2012, showed total cholesterol 241, triglycerides 217, LDL was 158, HDL 40. His triglycerides are actually down from about 400 last year with the fenofibrate so that seems to be working. Unfortunately, he was directed to take Livalo 2 mg daily last year at his last visit but did not start that and is now contemplating discontinuing the fenofibrate as he is concerned this is causing his muscle pain. Since his last visit, he has stopped both the little and fenofibrate. Prior to his last blood work, he went out with some friends and was drinking bourbon in his triglycerides were 1400. Therefore difficult to know what his LDL actually is without measuring a direct LDL. We discussed options as alternatives to statins including WelChol and his idea, however he is not interested in any further medications.  Mr. Breeze has no new complaints today. 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 He is undergone radiation therapy and is apparently clear of this. Unfortunately he was intolerant to the statins and cannot afford to take fenofibrate. He is currently not on any therapy for lipid lowering and does have moderate coronary artery disease. We have discussed this in the past and felt there were a few additional options. He does say that he lost 20 or 30 pounds recently due to his cancer and wonders if his cholesterol is improved.  01/10/2016  Matthew Moreno returns today for follow-up. He  seems to be doing fairly well although recently he's had some worsening shortness of breath and fatigue. This is mostly with exertion. 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 Several days he's had to go back home and go to sleep for the rest of the day. He does not associate this with the hot weather. He denies any chest pain with this but has been more fatigued recently. He is concern is a friend of his had some similar symptoms and underwent a bypass surgery. Matthew Moreno does have a history of moderate coronary artery disease by catheterization in 2010. He's had difficult to control cholesterol due to intolerance to statins but has been able to take Livalo. Recent lab work indicated much higher triglycerides been previously, in fact greater than 400 therefore LDL was not calculated, however LDL last year was 102.  01/11/2017  Matthew Moreno is seen today in follow-up. Overall he is asymptomatic. He was having some shortness of breath but that improved. We did a stress test last year which was negative for ischemia. He's gained about 15 pounds since we last saw him, he feels that he's eating last however gaining weight. He denies any swelling. He's not had any significant muscle mass. He generally eats a large "country" breakfast, usually a sandwich for lunch and very little if anything for dinner. He's had elevated triglycerides in the past and an LDL over 100. I started him on Livalo which she is tolerating, but is not made a significant improvement in triglycerides rather his LDL was lower in the  60s. Fasting blood glucose is been in the low 100s in the past but he is not known to be diabetic.  PMHx:  Past Medical History:  Diagnosis Date  . CAD (coronary artery disease) 2008  . Dyslipidemia   . Family history of heart disease   . Hypertension   . Nephrolithiasis    H/O multiple "kidney stones", Lithotripsies, J-Stents  . Panic attacks   . S/P radiation therapy  02/12/2014-04/02/2014   Left tonsil and bilateral neck / 70 Gy in 35 fractions to gross disease, 63 Gy in 35 fractions to high risk nodalechelons, and 56 Gy in 35 fractions to intermediate risk nodal echelons   . Statin intolerance   . Tonsil cancer ([place]) 01/05/14   left    Past Surgical History:  Procedure Laterality Date  . BICEPS TENDON REPAIR Left   . CARDIAC CATHETERIZATION  06/19/2008   normal left main, Cfx with 3 OMs (50% osital narrowing in OM1), LAD densely calcified in the proximal half of vessel with eccentric narrowing in mid region (40-50% severity), diag 1 & 2 free of disease, RCA with less than 30% narrowing in midportion (Dr. Loni Muse. [place])  . COLONOSCOPY  2010   Dr. Ardis Hughs    FAMHx:  Family History  Problem Relation Age of Onset  . Heart Problems Mother   . Heart Problems Father     SOCHx:   reports that he quit smoking about 33 years ago. His smoking use included Cigarettes. He has a 15.00 pack-year smoking history. He has never used smokeless tobacco. He reports that he drinks about 1.8 oz of alcohol per week . He reports that he does not use drugs.  ALLERGIES:  Allergies  Allergen Reactions  . Bystolic [Nebivolol Hcl]   . Lipitor [Atorvastatin] Other (See Comments)    Muscle aches with statins  . Lisinopril   . Pravastatin   . Red Yeast Rice [Cholestin]   . Sulfonamide Derivatives     REACTION: hives  . Zocor [Simvastatin]     ROS: Pertinent items noted in HPI and remainder of comprehensive ROS otherwise negative.  HOME MEDS: Current Outpatient Prescriptions  Medication Sig Dispense Refill  . ALPRAZolam (XANAX) 0.5 MG tablet Take 1 tablet (0.5 mg total) by mouth at bedtime as needed. 30 tablet 4  . aspirin 81 MG tablet Take 160 mg by mouth daily.    . calcium carbonate (TUMS EX) 750 MG chewable tablet Chew 1 tablet by mouth daily.    . metoprolol tartrate (LOPRESSOR) 25 MG tablet TAKE 1/2 TABLET(12.5 MG) BY MOUTH TWICE DAILY 30 tablet 3  . Multiple  Vitamins-Minerals (MULTIVITAMIN WITH MINERALS) tablet Take 1 tablet by mouth daily. Reported on 11/11/2015    . Pitavastatin Calcium (LIVALO) 2 MG TABS Take 1 tablet (2 mg total) by mouth daily. 30 tablet 5  . sodium fluoride (FLUORISHIELD) 1.1 % GEL dental gel Instill 1 drop into each tooth space of fluoride tray. Place over teeth for 5 minutes. Remove. Spit out excess. Repeat nightly. 120 mL prn   No current facility-administered medications for this visit.    Facility-Administered Medications Ordered in Other Visits  Medication Dose Route Frequency Provider Last Rate Last Dose  . influenza  inactive virus vaccine (FLUZONE/FLUARIX) injection 0.5 mL  0.5 mL Intramuscular Once Denita Lung, MD        LABS/IMAGING: No results found for this or any previous visit (from the past 48 hour(s)). No results found.  VITALS: BP (!) 166/80  Pulse 68   Ht 5\' 5"  (1.651 m)   Wt 152 lb (68.9 kg)   BMI 25.29 kg/m   EXAM: General appearance: alert and no distress Neck: no carotid bruit, no JVD and thyroid not enlarged, symmetric, no tenderness/mass/nodules Lungs: clear to auscultation bilaterally Heart: regular rate and rhythm, S1, S2 normal, no murmur, click, rub or gallop Abdomen: soft, non-tender; bowel sounds normal; no masses,  no organomegaly Extremities: extremities normal, atraumatic, no cyanosis or edema Pulses: 2+ and symmetric Skin: Skin color, texture, turgor normal. No rashes or lesions Neurologic: Grossly normal Psych: Pleasant  EKG: Normal sinus rhythm at 68 - personally reviewed  ASSESSMENT: 1. Moderate nonobstructive coronary artery disease (2010 cath) 2. Hypertension 3. Dyslipidemia - on Livalo 4. Recent tonsillar cancer 5. DOE - LVEF 60-65%, mild AI (no discernible murmur)  PLAN: 1.   Mr. Grand had a history of moderate nonobstructive coronary disease. He's asymptomatic and had a negative nuclear stress test last year. We are working on aggressive lipid management  although he's had elevated triglycerides. At times greater than 500 but recently lower than that. He's responded to Livalo with his most recent LDL of 62. We'll repeat a lipid profile today as well as a direct LDL. If his triglycerides remain elevated over 500 than there be an indication for adding either a fibrate or omega-3 fatty acids for prevention of pancreatitis.  Follow-up annually or sooner as necessary.  Pixie Casino, MD, [facility] Attending Cardiologist [place] 01/11/2017, 1:47 PM

## 2017-01-11 NOTE — Telephone Encounter (Signed)
Let him know that I only have a month's worth and you can call the rest in. I will leave them at the front desk

## 2017-01-11 NOTE — Patient Instructions (Addendum)
Your physician recommends that you return for lab work FASTING to check cholesterol   Your physician wants you to follow-up in: ONE YEAR with Dr. Hilty. You will receive a reminder letter in the mail two months in advance. If you don't receive a letter, please call our office to schedule the follow-up appointment.   

## 2017-01-11 NOTE — Telephone Encounter (Signed)
[facility] wanted to let you know that Livalo is not on pt's formulary list. Do you want to switch patient's medication or obtain PA?   Thank you, Wells Guiles

## 2017-01-11 NOTE — Telephone Encounter (Signed)
Pt called and scheduled a medicare well visit in Nov. Pt needs samples of Livalo to last until he can get in. Pt can be reached at [phone].

## 2017-01-11 NOTE — Telephone Encounter (Signed)
Pt informed and he verbalized understanding.

## 2017-01-11 NOTE — Telephone Encounter (Signed)
I am pretty sure he is statin intolerant. See if we can get a PA

## 2017-01-11 NOTE — Telephone Encounter (Signed)
I have already done a P.A.in the past for this pt and it was approved but cost was $343 and you had said to continue to supply pt with samples whenever we have them because he is Statin intolerant and you wanted to keep him on this medication.

## 2017-01-12 ENCOUNTER — Other Ambulatory Visit: Payer: Self-pay

## 2017-01-12 DIAGNOSIS — E781 Pure hyperglyceridemia: Secondary | ICD-10-CM

## 2017-01-12 MED ORDER — PITAVASTATIN CALCIUM 2 MG PO TABS: 1.0000 | ORAL_TABLET | Freq: Every day | ORAL | 5 refills | Status: DC

## 2017-01-12 NOTE — Progress Notes (Signed)
Pt states he does not use [facility] [address] and wanted pitavastatin 2mg  sent to [facility]. [name]

## 2017-01-12 NOTE — Telephone Encounter (Signed)
Let him know that he can take the medication every other day and still good good results and when these run out, have him call us to get some more

## 2017-02-08 ENCOUNTER — Other Ambulatory Visit (INDEPENDENT_AMBULATORY_CARE_PROVIDER_SITE_OTHER): Payer: Medicare Other

## 2017-02-08 DIAGNOSIS — Z23 Encounter for immunization: Secondary | ICD-10-CM | POA: Diagnosis not present

## 2017-02-08 DIAGNOSIS — E781 Pure hyperglyceridemia: Secondary | ICD-10-CM | POA: Diagnosis not present

## 2017-02-08 LAB — LDL CHOLESTEROL, DIRECT: LDL Direct: 67 mg/dL (ref 0–99)

## 2017-02-08 LAB — LIPID PANEL
Chol/HDL Ratio: 6.1 ratio — ABNORMAL HIGH (ref 0.0–5.0)
Cholesterol, Total: 208 mg/dL — ABNORMAL HIGH (ref 100–199)
HDL: 34 mg/dL — ABNORMAL LOW
Triglycerides: 909 mg/dL (ref 0–149)

## 2017-02-15 ENCOUNTER — Telehealth: Payer: Self-pay | Admitting: Internal Medicine

## 2017-02-15 ENCOUNTER — Encounter: Payer: Self-pay | Admitting: Internal Medicine

## 2017-02-15 DIAGNOSIS — E781 Pure hyperglyceridemia: Secondary | ICD-10-CM

## 2017-02-15 MED ORDER — FENOFIBRATE 160 MG PO TABS: 160.0000 mg | ORAL_TABLET | Freq: Every day | ORAL | 5 refills | Status: DC

## 2017-02-15 NOTE — Telephone Encounter (Signed)
Patient called w/MD recommendations. He has previously taken fenofibrate per his memory and chart review, but he cannot remember he had SE to this. He is willing to try again. Rx(s) sent to pharmacy electronically. Mailed copy of labs and lab order.   Notes recorded by Pixie Casino, MD on 02/10/2017 at 10:31 AM EDT Direct LDL at goal - trigs very high again. Recommend starting fenofibrate 160 mg daily. Repeat FLP and direct LDL in 3 months.  Dr. Lemmie Evens

## 2017-02-16 ENCOUNTER — Telehealth: Payer: Self-pay | Admitting: Family Medicine

## 2017-02-16 NOTE — Telephone Encounter (Signed)
Pt has appt with [place] Nov 13.  Advised to take both meds.

## 2017-02-16 NOTE — Telephone Encounter (Signed)
Pt called and stated that Dr. Debara Pickett noted that his Triglycerides were very high and added fenofibrate. Pt asked if he needs to be on both fenofibrate and Livalo. He also is concerned about the high triglycerides. Please advise pt at [phone].

## 2017-02-16 NOTE — Telephone Encounter (Signed)
Yes he should be taking both the fenofibrate and Livalo. Have him set up an appointment to see me in the next month or 2

## 2017-02-25 ENCOUNTER — Other Ambulatory Visit: Payer: Self-pay | Admitting: Internal Medicine

## 2017-03-25 ENCOUNTER — Other Ambulatory Visit: Payer: Self-pay | Admitting: Internal Medicine

## 2017-03-26 ENCOUNTER — Telehealth: Payer: Self-pay | Admitting: Family Medicine

## 2017-03-26 NOTE — Telephone Encounter (Signed)
Placed # [id number] Livalo Lot # [id number] exp 11/2017 for pt . Pt will come pick up samples.

## 2017-03-26 NOTE — Telephone Encounter (Signed)
Give him some samples

## 2017-03-26 NOTE — Telephone Encounter (Signed)
Rcvd voicemail from pt requesting more samples of Livalo since he is out of this med

## 2017-03-29 NOTE — Progress Notes (Signed)
Mr. Demarais presents for follow up of radiation completed 04/02/2014 to his Left Tonsil and bilateral neck.   Pain issues, if any: He denies Using a feeding tube?: No Weight changes, if any:  Wt Readings from Last 3 Encounters:  04/02/17 155 lb 9.6 oz (70.6 kg)  03/30/17 152 lb 6.4 oz (69.1 kg)  01/11/17 152 lb (68.9 kg)   Swallowing issues, if any: He denies difficulty swallowing.  Smoking or chewing tobacco? No Using fluoride trays daily? Yes Last ENT visit was on: Dr. Lucia Gaskins 10/15/16 Other notable issues, if any:  He reports muscle cramps to the left side of his neck that occur with stretching or moving a certain way.  BP (!) 161/79   Pulse 75   Temp 98.4 F (36.9 C)   Ht 5\' 6"  (1.676 m)   Wt 155 lb 9.6 oz (70.6 kg)   SpO2 98% Comment: room air  BMI 25.11 kg/m

## 2017-03-30 ENCOUNTER — Encounter: Payer: Self-pay | Admitting: Family Medicine

## 2017-03-30 ENCOUNTER — Ambulatory Visit: Payer: Medicare Other | Admitting: Family Medicine

## 2017-03-30 VITALS — BP 110/70 | HR 72 | Resp 16 | Ht 66.0 in | Wt 152.4 lb

## 2017-03-30 DIAGNOSIS — Z789 Other specified health status: Secondary | ICD-10-CM

## 2017-03-30 DIAGNOSIS — I10 Essential (primary) hypertension: Secondary | ICD-10-CM

## 2017-03-30 DIAGNOSIS — E781 Pure hyperglyceridemia: Secondary | ICD-10-CM | POA: Diagnosis not present

## 2017-03-30 DIAGNOSIS — I2583 Coronary atherosclerosis due to lipid rich plaque: Secondary | ICD-10-CM | POA: Diagnosis not present

## 2017-03-30 DIAGNOSIS — Z9841 Cataract extraction status, right eye: Secondary | ICD-10-CM | POA: Diagnosis not present

## 2017-03-30 DIAGNOSIS — Z1329 Encounter for screening for other suspected endocrine disorder: Secondary | ICD-10-CM

## 2017-03-30 DIAGNOSIS — I2510 Atherosclerotic heart disease of native coronary artery without angina pectoris: Secondary | ICD-10-CM

## 2017-03-30 DIAGNOSIS — M1990 Unspecified osteoarthritis, unspecified site: Secondary | ICD-10-CM | POA: Diagnosis not present

## 2017-03-30 DIAGNOSIS — Z0000 Encounter for general adult medical examination without abnormal findings: Secondary | ICD-10-CM | POA: Diagnosis not present

## 2017-03-30 DIAGNOSIS — Z87891 Personal history of nicotine dependence: Secondary | ICD-10-CM

## 2017-03-30 DIAGNOSIS — Z9842 Cataract extraction status, left eye: Secondary | ICD-10-CM

## 2017-03-30 DIAGNOSIS — K219 Gastro-esophageal reflux disease without esophagitis: Secondary | ICD-10-CM | POA: Diagnosis not present

## 2017-03-30 DIAGNOSIS — C099 Malignant neoplasm of tonsil, unspecified: Secondary | ICD-10-CM | POA: Diagnosis not present

## 2017-03-30 LAB — POCT URINALYSIS DIP (PROADVANTAGE DEVICE)
Bilirubin, UA: NEGATIVE
Blood, UA: NEGATIVE
GLUCOSE UA: NEGATIVE mg/dL
Ketones, POC UA: NEGATIVE mg/dL
LEUKOCYTES UA: NEGATIVE
Nitrite, UA: NEGATIVE
PROTEIN UA: NEGATIVE mg/dL
SPECIFIC GRAVITY, URINE: 1.03
UUROB: NEGATIVE
pH, UA: 6 (ref 5.0–8.0)

## 2017-03-30 NOTE — Progress Notes (Signed)
Matthew Moreno is a 71 y.o. male who presents for annual wellness visit, complete exam and follow-up on chronic medical conditions.  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  This is been over 3 years.  Although he has a history of statin intolerance, he seems to be tolerating Livalo.  He was recently placed on fenofibrate to help with elevated triglycerides.  Does have underlying coronary artery disease and is seen on a yearly basis for that.  He has very minimal difficulty with arthritis.  He usually gets thyroid function tested done because of radiation to the neck from treatment of his tonsillar cancer.  He has had previous bilateral cataract surgery which is been quite helpful.  Does have reflux disease but is not having any difficulty with that.  He is a former smoker with a negative AAA evaluation.   Immunizations and Health Maintenance Immunization History  Administered Date(s) Administered  . Influenza Split 03/18/2001, 03/17/2002, 03/18/2005, 02/25/2007, 02/10/2010, 01/29/2011, 02/15/2012  . Influenza, High Dose Seasonal PF 03/13/2013, 02/28/2014, 02/06/2015, 02/21/2016, 02/08/2017  . Pneumococcal Conjugate-13 11/11/2015  . Pneumococcal Polysaccharide-23 03/26/2005, 03/24/2007  . Td 06/28/1981, 03/26/2005  . Zoster 05/30/2010   Health Maintenance Due  Topic Date Due  . TETANUS/TDAP  03/27/2015  . PNA vac Low Risk Adult (2 of 2 - PPSV23) 11/10/2016    Last colonoscopy: 2010 Last PSA: 2017 Dentist: yes- Alex Gardener Ophtho: has not seen one since cataract surgery  Exercise: walking daily 30 minutes.   Other doctors caring for patient include:  Throat doctor- Dr. Lucia Gaskins and doctor at [facility] Dr. Isidore Moos and Dr. Debara Pickett.   Advanced Directives: no information given    Depression screen:  See questionnaire below.     Depression screen [facility] 2/9 03/30/2017 04/03/2016 08/23/2015 08/01/2014 04/20/2014  Decreased  Interest 0 0 0 0 0  Down, Depressed, Hopeless 0 0 0 0 0  PHQ - 2 Score 0 0 0 0 0    Fall Screen: See Questionaire below.   Fall Risk  03/30/2017 04/03/2016 08/23/2015 08/01/2014 04/20/2014  Falls in the past year? No No No No No    ADL screen:  See questionnaire below.  Functional Status Survey: Is the patient deaf or have difficulty hearing?: No Does the patient have difficulty seeing, even when wearing glasses/contacts?: No Does the patient have difficulty concentrating, remembering, or making decisions?: No Does the patient have difficulty walking or climbing stairs?: No Does the patient have difficulty dressing or bathing?: No Does the patient have difficulty doing errands alone such as visiting a doctor's office or shopping?: No   Review of Systems  Constitutional: -, -unexpected weight change, -anorexia, -fatigue Allergy: -sneezing, -itching, -congestion Dermatology: denies changing moles, rash, lumps ENT: -runny nose, -ear pain, -sore throat,  Cardiology:  -chest pain, -palpitations, -orthopnea, Respiratory: -cough, -shortness of breath, -dyspnea on exertion, -wheezing,  Gastroenterology: -abdominal pain, -nausea, -vomiting, -diarrhea, -constipation, -dysphagia Hematology: -bleeding or bruising problems Musculoskeletal: -arthralgias, -myalgias, -joint swelling, -back pain, - Ophthalmology: -vision changes,  Urology: -dysuria, -difficulty urinating,  -urinary frequency, -urgency, incontinence Neurology: -, -numbness, , -memory loss, -falls, -dizziness    PHYSICAL EXAM:  General Appearance: Alert, cooperative, no distress, appears stated age Head: Normocephalic, without obvious abnormality, atraumatic Eyes: PERRL, conjunctiva/corneas clear, EOM's intact, fundi benign Ears: Normal TM's and external ear canals Nose: Nares normal, mucosa normal, no drainage or sinus   tenderness Throat: Lips, mucosa, and tongue normal; teeth and gums normal  Neck: Supple, no  lymphadenopathy, thyroid:no enlargement/tenderness/nodules; no carotid bruit or JVD Lungs: Clear to auscultation bilaterally without wheezes, rales or ronchi; respirations unlabored Heart: Regular rate and rhythm, S1 and S2 normal, no murmur, rub or gallop Abdomen: Soft, non-tender, nondistended, normoactive bowel sounds, no masses, no hepatosplenomegaly Extremities: No clubbing, cyanosis or edema Pulses: 2+ and symmetric all extremities Skin: Skin color, texture, turgor normal, no rashes or lesions Lymph nodes: Cervical, supraclavicular, and axillary nodes normal Neurologic: CNII-XII intact, normal strength, sensation and gait; reflexes 2+ and symmetric throughout   Psych: Normal mood, affect, hygiene and grooming  ASSESSMENT/PLAN: Routine general medical examination at a health care facility - Plan: CBC with Differential/Platelet, Comprehensive metabolic panel, Lipid panel  Medicare annual wellness visit, subsequent - Plan: POCT Urinalysis DIP (Proadvantage Device), CBC with Differential/Platelet, Comprehensive metabolic panel, Lipid panel  Hypertriglyceridemia - Plan: Lipid panel  Former smoker  Gastroesophageal reflux disease, esophagitis presence not specified  Coronary artery disease due to lipid rich plaque - Plan: CBC with Differential/Platelet, Comprehensive metabolic panel, Lipid panel  Tonsil cancer ([place]) - Plan: CBC with Differential/Platelet, Comprehensive metabolic panel  Coronary artery disease involving native coronary artery of native heart without angina pectoris - Plan: CBC with Differential/Platelet, Comprehensive metabolic panel, Lipid panel  Arthritis  Essential hypertension - Plan: CBC with Differential/Platelet, Comprehensive metabolic panel  Statin intolerance - Plan: Lipid panel  Screening for thyroid disorder - Plan: TSH  H/O bilateral cataract extraction  Overall he is doing quite well with the above diagnoses.  He will continue to be followed by  oncology as well as cardiology.  He has now been on fenofibrate so we will check his lipids to see how much benefit he is obtained from that.  The thyroid is done mainly because of the radiation to the neck area.  He is really having no trouble with his reflux.  He is very happy with his cataract surgery.  Life in general is going quite well for him.  His wife did die about 9 years ago.    Medicare Attestation I have personally reviewed: The patient's medical and social history Their use of alcohol, tobacco or illicit drugs Their current medications and supplements The patient's functional ability including ADLs,fall risks, home safety risks, cognitive, and hearing and visual impairment Diet and physical activities Evidence for depression or mood disorders  The patient's weight, height, and BMI have been recorded in the chart.  I have made referrals, counseling, and provided education to the patient based on review of the above and I have provided the patient with a written personalized care plan for preventive services.     [name], MD   03/30/2017

## 2017-03-31 LAB — COMPREHENSIVE METABOLIC PANEL
AG RATIO: 1.5 (calc) (ref 1.0–2.5)
ALBUMIN MSPROF: 4.4 g/dL (ref 3.6–5.1)
ALKALINE PHOSPHATASE (APISO): 47 U/L (ref 40–115)
ALT: 22 U/L (ref 9–46)
AST: 26 U/L (ref 10–35)
BILIRUBIN TOTAL: 0.4 mg/dL (ref 0.2–1.2)
BUN: 16 mg/dL (ref 7–25)
CALCIUM: 9.9 mg/dL (ref 8.6–10.3)
CHLORIDE: 104 mmol/L (ref 98–110)
CO2: 27 mmol/L (ref 20–32)
Creat: 1.11 mg/dL (ref 0.70–1.18)
GLOBULIN: 3 g/dL (ref 1.9–3.7)
Glucose, Bld: 101 mg/dL — ABNORMAL HIGH (ref 65–99)
POTASSIUM: 4.5 mmol/L (ref 3.5–5.3)
SODIUM: 139 mmol/L (ref 135–146)
TOTAL PROTEIN: 7.4 g/dL (ref 6.1–8.1)

## 2017-03-31 LAB — CBC WITH DIFFERENTIAL/PLATELET
BASOS PCT: 0.7 %
Basophils Absolute: 39 cells/uL (ref 0–200)
EOS PCT: 1.3 %
Eosinophils Absolute: 72 cells/uL (ref 15–500)
HEMATOCRIT: 41.1 % (ref 38.5–50.0)
HEMOGLOBIN: 14.7 g/dL (ref 13.2–17.1)
LYMPHS ABS: 897 {cells}/uL (ref 850–3900)
MCH: 32.5 pg (ref 27.0–33.0)
MCHC: 35.8 g/dL (ref 32.0–36.0)
MCV: 90.9 fL (ref 80.0–100.0)
MPV: 9.6 fL (ref 7.5–12.5)
Monocytes Relative: 12.2 %
NEUTROS ABS: 3823 {cells}/uL (ref 1500–7800)
NEUTROS PCT: 69.5 %
Platelets: 326 10*3/uL (ref 140–400)
RBC: 4.52 10*6/uL (ref 4.20–5.80)
RDW: 12.9 % (ref 11.0–15.0)
Total Lymphocyte: 16.3 %
WBC: 5.5 10*3/uL (ref 3.8–10.8)
WBCMIX: 671 {cells}/uL (ref 200–950)

## 2017-03-31 LAB — LIPID PANEL
CHOLESTEROL: 221 mg/dL — AB (ref <200)
HDL: 42 mg/dL (ref >40)
LDL CHOLESTEROL (CALC): 130 mg/dL — AB
Non-HDL Cholesterol (Calc): 179 mg/dL (calc) — ABNORMAL HIGH (ref <130)
Total CHOL/HDL Ratio: 5.3 (calc) — ABNORMAL HIGH (ref <5.0)
Triglycerides: 345 mg/dL — ABNORMAL HIGH (ref <150)

## 2017-03-31 LAB — TSH: TSH: 2.08 m[IU]/L (ref 0.40–4.50)

## 2017-04-02 ENCOUNTER — Ambulatory Visit
Admission: RE | Admit: 2017-04-02 | Discharge: 2017-04-02 | Disposition: A | Discharge status: Home / Self Care | Payer: Medicare Other | Source: Ambulatory Visit | Attending: Radiation Oncology | Admitting: Radiation Oncology

## 2017-04-02 ENCOUNTER — Encounter: Payer: Self-pay | Admitting: Adult Health

## 2017-04-02 ENCOUNTER — Encounter: Payer: Self-pay | Admitting: Radiation Oncology

## 2017-04-02 DIAGNOSIS — Z08 Encounter for follow-up examination after completed treatment for malignant neoplasm: Secondary | ICD-10-CM | POA: Insufficient documentation

## 2017-04-02 DIAGNOSIS — Z79899 Other long term (current) drug therapy: Secondary | ICD-10-CM | POA: Insufficient documentation

## 2017-04-02 DIAGNOSIS — Z7982 Long term (current) use of aspirin: Secondary | ICD-10-CM | POA: Diagnosis not present

## 2017-04-02 DIAGNOSIS — C099 Malignant neoplasm of tonsil, unspecified: Secondary | ICD-10-CM

## 2017-04-02 DIAGNOSIS — Z1329 Encounter for screening for other suspected endocrine disorder: Secondary | ICD-10-CM

## 2017-04-02 DIAGNOSIS — C090 Malignant neoplasm of tonsillar fossa: Secondary | ICD-10-CM

## 2017-04-02 DIAGNOSIS — M62838 Other muscle spasm: Secondary | ICD-10-CM | POA: Diagnosis not present

## 2017-04-02 DIAGNOSIS — Z85818 Personal history of malignant neoplasm of other sites of lip, oral cavity, and pharynx: Secondary | ICD-10-CM | POA: Insufficient documentation

## 2017-04-02 LAB — TSH: TSH: 2.705 m[IU]/L (ref 0.320–4.118)

## 2017-04-02 NOTE — Progress Notes (Signed)
Radiation Oncology         [phone] ________________________________  Name: Matthew Moreno MRN: [id number]  Date: 04/02/2017  DOB: 09-23-1945  Follow-Up Visit Note  CC: Matthew Lung, MD  Matthew Moreno, *  Diagnosis and Prior Radiotherapy:       ICD-10-CM   1. 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  Narrative:  The patient returns today for routine follow-up. He is doing well, but notes intermittent cramping to his neck that will cause him mild discomfort. He doesn't smoke or use chewing tobacco. He drinks about three nights a week, having 2-3 drinks a night. He denies any issues swallowing, though he reports he will drink water while eating to help with his dry throat. He reports using fluoride trays daily. The patient reports a stable weight over the last year. Pt denies any other symptoms or complaints at this time.  ALLERGIES:  is allergic to bystolic [nebivolol hcl]; lipitor [atorvastatin]; lisinopril; pravastatin; red yeast rice [cholestin]; sulfonamide derivatives; and zocor [simvastatin].  Meds: Current Outpatient Medications  Medication Sig Dispense Refill  . ALPRAZolam (XANAX) 0.5 MG tablet Take 1 tablet (0.5 mg total) by mouth at bedtime as needed. 30 tablet 4  . aspirin 81 MG tablet Take 160 mg by mouth daily.    . calcium carbonate (TUMS EX) 750 MG chewable tablet Chew 1 tablet by mouth daily.    . fenofibrate 160 MG tablet Take 1 tablet (160 mg total) by mouth daily. 30 tablet 5  . metoprolol tartrate (LOPRESSOR) 25 MG tablet TAKE 1/2 TABLET(12.5 MG) BY MOUTH TWICE DAILY 30 tablet 0  . Multiple Vitamins-Minerals (MULTIVITAMIN WITH MINERALS) tablet Take 1 tablet by mouth daily. Reported on 11/11/2015    . Pitavastatin Calcium (LIVALO) 2 MG TABS Take 1 tablet (2 mg total) by mouth daily. 30 tablet 5  .  sodium fluoride (FLUORISHIELD) 1.1 % GEL dental gel Instill 1 drop into each tooth space of fluoride tray. Place over teeth for 5 minutes. Remove. Spit out excess. Repeat nightly. 120 mL prn   No current facility-administered medications for this encounter.    Facility-Administered Medications Ordered in Other Encounters  Medication Dose Route Frequency Provider Last Rate Last Dose  . influenza  inactive virus vaccine (FLUZONE/FLUARIX) injection 0.5 mL  0.5 mL Intramuscular Once Matthew Lung, MD        Physical Findings: The patient is in no acute distress. Patient is alert and oriented. Wt Readings from Last 3 Encounters:  04/02/17 155 lb 9.6 oz (70.6 kg)  03/30/17 152 lb 6.4 oz (69.1 kg)  01/11/17 152 lb (68.9 kg)    height is 5\' 6"  (1.676 m) and weight is 155 lb 9.6 oz (70.6 kg). His temperature is 98.4 F (36.9 C). His blood pressure is 161/79 (abnormal) and his pulse is 75. His oxygen saturation is 98%. .  General: Alert and oriented, in no acute distress HEENT: Head is normocephalic. Dentures were removed for exam. Extraocular movements are intact. Oral cavity and Oralphyanx are clear.  Mucous membranes are moist. No lesions in the oral cavity Neck: Neck is unremarkable. No palpable masses in the  cervical or supraclavicular regions. Skin: healed over the neck. Heart: Regular in rate and rhythm with no murmurs, rubs, or gallops.  Chest: Clear to auscultation bilaterally, with no rhonchi, wheezes, or rales.  Abdomen: Soft, nontender, nondistended, with no rigidity or guarding. Normal bowel sounds. Extremities: No cyanosis or edema in UEs. Lymphatics: see Neck Exam Psychiatric: Judgment and insight are intact. Affect is appropriate.  Lab Findings: Lab Results  Component Value Date   WBC 5.5 03/30/2017   HGB 14.7 03/30/2017   HCT 41.1 03/30/2017   MCV 90.9 03/30/2017   PLT 326 03/30/2017    Lab Results  Component Value Date   TSH 2.08 03/30/2017    Radiographic  Findings: No new imaging. No results found.  Impression/Plan:    1) Head and Neck Cancer Status: No evidence of recurrence.  2) Nutritional Status: Good  3) Risk Factors: The patient has been educated about risk factors including tobacco abuse; they understand that avoidance of tobacco is important to prevent recurrences as well as other cancers. Abstains from tobacco. Ideally, he was advised to avoid ETOH, or average no more than 1 drink daily  4) Swallowing: No difficulty reported.  5) Dental: Encouraged to continue regular followup with dentistry, and dental hygiene including fluoride rinses.  6) Dr. Redmond School has been checking his thyroid function, with the most recent levels resulting normal.  Lab Results  Component Value Date   TSH 2.08 03/30/2017    7) Other: neck cramping, occasional. He declines PT at this time.  8) Follow-up in 12 months. The patient was encouraged to call with any issues or questions before then.   _____________________________________   Eppie Gibson, MD  This document serves as a record of services personally performed by Eppie Gibson, MD. It was created on her behalf by Margit Banda, a trained medical scribe. The creation of this record is based on the scribe's personal observations and the provider's statements to them. This document has been checked and approved by the attending provider.

## 2017-04-26 ENCOUNTER — Other Ambulatory Visit: Payer: Self-pay | Admitting: Internal Medicine

## 2017-04-28 ENCOUNTER — Other Ambulatory Visit: Payer: Self-pay

## 2017-04-28 DIAGNOSIS — E781 Pure hyperglyceridemia: Secondary | ICD-10-CM

## 2017-04-29 MED ORDER — PITAVASTATIN CALCIUM 2 MG PO TABS: 1.0000 | ORAL_TABLET | Freq: Every day | ORAL | 0 refills | Status: DC

## 2017-05-24 ENCOUNTER — Encounter: Payer: Self-pay | Admitting: Medical

## 2017-05-24 ENCOUNTER — Ambulatory Visit: Payer: Medicare Other | Admitting: Medical

## 2017-05-24 ENCOUNTER — Ambulatory Visit
Admission: RE | Admit: 2017-05-24 | Discharge: 2017-05-24 | Disposition: A | Discharge status: Home / Self Care | Payer: Medicare Other | Source: Ambulatory Visit | Attending: Medical | Admitting: Medical

## 2017-05-24 VITALS — BP 138/88 | HR 99 | Temp 98.1°F | Wt 157.2 lb

## 2017-05-24 DIAGNOSIS — R918 Other nonspecific abnormal finding of lung field: Secondary | ICD-10-CM

## 2017-05-24 DIAGNOSIS — R05 Cough: Secondary | ICD-10-CM | POA: Diagnosis not present

## 2017-05-24 DIAGNOSIS — R062 Wheezing: Secondary | ICD-10-CM

## 2017-05-24 DIAGNOSIS — R059 Cough, unspecified: Secondary | ICD-10-CM

## 2017-05-24 MED ORDER — AZITHROMYCIN 250 MG PO TABS: ORAL_TABLET | ORAL | 0 refills | Status: DC

## 2017-05-24 MED ORDER — ALBUTEROL SULFATE HFA 108 (90 BASE) MCG/ACT IN AERS: 2.0000 | INHALATION_SPRAY | Freq: Four times a day (QID) | RESPIRATORY_TRACT | 0 refills | Status: DC | PRN

## 2017-05-24 NOTE — Progress Notes (Signed)
Subjective:  Matthew Moreno is a 72 y.o. male who presents for cough, cold. He reports 10 days hx/o cough.  Started with bad cough, then the cough got a little better.   Some hot feeling a times.   Felt rattly in chest.   No sore throat.   Has some runny nose, has had some productive nasal mucous.  Has had some productive mucous.  No ear pain, no fever, no chills.   No NVD.   Using Nyquil at night, mucinex in the day.   + sick contacts.   Patient is not a smoker. No other aggravating or relieving factors.  No other c/o.  The following portions of the patient's history were reviewed and updated as appropriate: allergies, current medications, past family history, past medical history, past social history, past surgical history and problem list.  ROS as in subjective  Past Medical History:  Diagnosis Date  . CAD (coronary artery disease) 2008  . Dyslipidemia   . Family history of heart disease   . Hypertension   . Nephrolithiasis    H/O multiple "kidney stones", Lithotripsies, J-Stents  . Panic attacks   . S/P radiation therapy 02/12/2014-04/02/2014   Left tonsil and bilateral neck / 70 Gy in 35 fractions to gross disease, 63 Gy in 35 fractions to high risk nodalechelons, and 56 Gy in 35 fractions to intermediate risk nodal echelons   . Statin intolerance   . Tonsil cancer ([place]) 01/05/14   left   Current Outpatient Medications on File Prior to Visit  Medication Sig Dispense Refill  . ALPRAZolam (XANAX) 0.5 MG tablet Take 1 tablet (0.5 mg total) by mouth at bedtime as needed. 30 tablet 4  . aspirin 81 MG tablet Take 160 mg by mouth daily.    . calcium carbonate (TUMS EX) 750 MG chewable tablet Chew 1 tablet by mouth daily.    . fenofibrate 160 MG tablet Take 1 tablet (160 mg total) by mouth daily. 30 tablet 5  . metoprolol tartrate (LOPRESSOR) 25 MG tablet TAKE 1/2 TABLET(12.5 MG) BY MOUTH TWICE DAILY 30 tablet 0  . Pitavastatin Calcium (LIVALO) 2 MG TABS Take 1 tablet (2 mg total) by  mouth daily. 21 tablet 0  . sodium fluoride (FLUORISHIELD) 1.1 % GEL dental gel Instill 1 drop into each tooth space of fluoride tray. Place over teeth for 5 minutes. Remove. Spit out excess. Repeat nightly. 120 mL prn  . Multiple Vitamins-Minerals (MULTIVITAMIN WITH MINERALS) tablet Take 1 tablet by mouth daily. Reported on 11/11/2015     Current Facility-Administered Medications on File Prior to Visit  Medication Dose Route Frequency Provider Last Rate Last Dose  . influenza  inactive virus vaccine (FLUZONE/FLUARIX) injection 0.5 mL  0.5 mL Intramuscular Once Denita Lung, MD       ROS as in subjective    Objective: BP 138/88   Pulse 99   Temp 98.1 F (36.7 C)   Wt 157 lb 3.2 oz (71.3 kg)   SpO2 98%   BMI 25.37 kg/m   General appearance: Alert, WD/WN, no distress                             Skin: warm, no rash, no diaphoresis                           Head: no sinus tenderness  Eyes: conjunctiva normal, corneas clear, PERRLA                            Ears: pearly TMs, external ear canals normal                          Nose: septum midline, turbinates swollen, with erythema and clear discharge             Mouth/throat: MMM, tongue normal, mild pharyngeal erythema                           Neck: supple, no adenopathy, no thyromegaly, nontender                          Heart: RRR, normal S1, S2, no murmurs                         Lungs: +bronchial breath sounds, +scattered rhonchi,  Decreased left upper sounds, otherwise no rales                Extremities: no edema, Nontender      Assessment: Encounter Diagnoses  Name Primary?  . Cough Yes  . Wheezing   . Abnormal lung field      Plan:  Begin zpak antibiotic for lingering chest cold, albuterol inhaler as needed as discussed.  We discussed proper use.  Will send for xray as well.   Suggested symptomatic OTC remedies for cough and congestion.  Yifan was seen today for coughing,  congestion.  Diagnoses and all orders for this visit:  Cough -     DG Chest 2 View; Future  Wheezing -     DG Chest 2 View; Future  Abnormal lung field -     DG Chest 2 View; Future  Other orders -     azithromycin (ZITHROMAX) 250 MG tablet; 2 tablets day 1, then 1 tablet days 2-4 -     albuterol (PROVENTIL HFA;VENTOLIN HFA) 108 (90 Base) MCG/ACT inhaler; Inhale 2 puffs into the lungs every 6 (six) hours as needed for wheezing or shortness of breath.

## 2017-05-25 ENCOUNTER — Other Ambulatory Visit: Payer: Self-pay | Admitting: Internal Medicine

## 2017-05-25 NOTE — Telephone Encounter (Signed)
Medication Detail    Disp Refills Start End   metoprolol tartrate (LOPRESSOR) 25 MG tablet 15 tablet 0 05/25/2017    Sig: TAKE 1/2 TABLET(12.5 MG) BY MOUTH TWICE DAILY   Sent to pharmacy as: metoprolol tartrate (LOPRESSOR) 25 MG tablet   Notes to Pharmacy: OVERDUE FOR OV CALL FOR APPT AND REFILLS   E-Prescribing Status: Receipt confirmed by pharmacy (05/25/2017 3:30 PM EST)   Pharmacy   [id number] - [place], [place]

## 2017-05-26 ENCOUNTER — Telehealth: Payer: Self-pay | Admitting: Internal Medicine

## 2017-05-26 MED ORDER — METOPROLOL TARTRATE 25 MG PO TABS: ORAL_TABLET | ORAL | 6 refills | Status: DC

## 2017-05-26 NOTE — Telephone Encounter (Signed)
New Message  Pt c/o medication issue:  1. Name of Medication: Metoprolol   2. How are you currently taking this medication (dosage and times per day)? 25mg   3. Are you having a reaction (difficulty breathing--STAT)? No   4. What is your medication issue? Needs pre Auth for medication to be refill at pharmacy

## 2017-05-26 NOTE — Telephone Encounter (Signed)
Returned call to patient he stated he needs a prior authorization for metoprolol.Advised I will send message to Dr.Hilty's RN.

## 2017-05-26 NOTE — Telephone Encounter (Signed)
Rx(s) sent to pharmacy electronically.  

## 2017-06-15 ENCOUNTER — Other Ambulatory Visit: Payer: Self-pay | Admitting: Medical

## 2017-07-05 ENCOUNTER — Telehealth: Payer: Self-pay | Admitting: Family Medicine

## 2017-07-05 NOTE — Telephone Encounter (Signed)
Pt called for samples of Livalo. Please call pt at [phone].  

## 2017-07-06 ENCOUNTER — Telehealth: Payer: Self-pay

## 2017-07-06 NOTE — Telephone Encounter (Signed)
Samples up front or pick up. Thanks [employer]

## 2017-07-06 NOTE — Telephone Encounter (Signed)
Pt called to let him know that sample of livalo will be up front for pick up. Thanks [employer]

## 2017-08-02 ENCOUNTER — Telehealth: Payer: Self-pay | Admitting: Family Medicine

## 2017-08-02 NOTE — Telephone Encounter (Signed)
Pt called for samples Livalo, samples given ok per Dr. Redmond School

## 2017-08-03 ENCOUNTER — Ambulatory Visit: Payer: Medicare Other | Admitting: Family Medicine

## 2017-08-03 ENCOUNTER — Encounter: Payer: Self-pay | Admitting: Family Medicine

## 2017-08-03 VITALS — BP 142/82 | HR 88 | Temp 98.7°F | Wt 154.8 lb

## 2017-08-03 DIAGNOSIS — J0100 Acute maxillary sinusitis, unspecified: Secondary | ICD-10-CM

## 2017-08-03 MED ORDER — AMOXICILLIN-POT CLAVULANATE 875-125 MG PO TABS: 1.0000 | ORAL_TABLET | Freq: Two times a day (BID) | ORAL | 0 refills | Status: DC

## 2017-08-03 NOTE — Patient Instructions (Signed)
Take all the antibiotic and if not totally back to normal when you finish give me a call 

## 2017-08-03 NOTE — Progress Notes (Signed)
   Subjective:    Patient ID: Matthew Moreno, male    DOB: [birth year], 72 y.o.   MRN: [id number]  HPI He complains of a 3-week history of difficulty with nasal congestion, PND, coughing but no fever, chills, earache or sore throat.   Review of Systems     Objective:   Physical Exam Alert and in no distress.  Slight tenderness over maxillary sinuses.  Tympanic membranes and canals are normal. Pharyngeal area is normal. Neck is supple without adenopathy or thyromegaly. Cardiac exam shows a regular sinus rhythm without murmurs or gallops. Lungs are clear to auscultation.        Assessment & Plan:  Acute non-recurrent maxillary sinusitis - Plan: amoxicillin-clavulanate (AUGMENTIN) 875-125 MG tablet He is to take all the antibiotic and if not totally back to normal when he is finished, he will call.

## 2017-08-10 ENCOUNTER — Other Ambulatory Visit: Payer: Self-pay | Admitting: Internal Medicine

## 2017-09-08 ENCOUNTER — Other Ambulatory Visit: Payer: Self-pay | Admitting: Internal Medicine

## 2017-09-08 NOTE — Telephone Encounter (Signed)
Rx sent to pharmacy   

## 2017-11-02 ENCOUNTER — Telehealth: Payer: Self-pay | Admitting: Family Medicine

## 2017-11-02 DIAGNOSIS — E781 Pure hyperglyceridemia: Secondary | ICD-10-CM

## 2017-11-02 MED ORDER — PITAVASTATIN CALCIUM 2 MG PO TABS: 1.0000 | ORAL_TABLET | Freq: Every day | ORAL | 0 refills | Status: DC

## 2017-11-02 NOTE — Telephone Encounter (Signed)
Samples given, ok per [employer]

## 2017-11-02 NOTE — Telephone Encounter (Signed)
Pt requesting more samples of Livalo 2 mg

## 2017-12-09 ENCOUNTER — Other Ambulatory Visit: Payer: Self-pay | Admitting: Internal Medicine

## 2017-12-21 ENCOUNTER — Other Ambulatory Visit: Payer: Self-pay

## 2017-12-21 DIAGNOSIS — E781 Pure hyperglyceridemia: Secondary | ICD-10-CM

## 2017-12-21 MED ORDER — PITAVASTATIN CALCIUM 2 MG PO TABS: 1.0000 | ORAL_TABLET | Freq: Every day | ORAL | 0 refills | Status: DC

## 2017-12-21 NOTE — Telephone Encounter (Signed)
Pt called for a refill on Livalo. Pt is getting a 28 days supply.

## 2017-12-30 ENCOUNTER — Other Ambulatory Visit: Payer: Self-pay | Admitting: Internal Medicine

## 2017-12-30 NOTE — Telephone Encounter (Signed)
Rx request sent to pharmacy.  

## 2018-01-07 ENCOUNTER — Other Ambulatory Visit: Payer: Self-pay | Admitting: Internal Medicine

## 2018-01-11 ENCOUNTER — Ambulatory Visit: Payer: Self-pay | Admitting: Internal Medicine

## 2018-01-12 ENCOUNTER — Ambulatory Visit: Payer: Medicare Other | Admitting: Internal Medicine

## 2018-01-12 VITALS — BP 130/80 | HR 73 | Ht 67.0 in | Wt 156.6 lb

## 2018-01-12 DIAGNOSIS — E781 Pure hyperglyceridemia: Secondary | ICD-10-CM

## 2018-01-12 DIAGNOSIS — I2510 Atherosclerotic heart disease of native coronary artery without angina pectoris: Secondary | ICD-10-CM

## 2018-01-12 DIAGNOSIS — I10 Essential (primary) hypertension: Secondary | ICD-10-CM

## 2018-01-12 NOTE — Progress Notes (Signed)
OFFICE NOTE  Chief Complaint:  No complaints  Primary Care Physician: Denita Lung, MD  HPI:  Matthew Moreno is a 72 year old gentleman we are following for history of coronary disease, moderate, in 2010 on catheterization, 50% LAD, 40% RCA and 50% OM lesion. He also has hypertension difficult to control, dyslipidemia and intolerance to statins. He has returned today with worsening leg pain which now he is attributing to fenofibrate. His most recent lipid profile obtained on April 11, 2012, showed total cholesterol 241, triglycerides 217, LDL was 158, HDL 40. His triglycerides are actually down from about 400 last year with the fenofibrate so that seems to be working. Unfortunately, he was directed to take Livalo 2 mg daily last year at his last visit but did not start that and is now contemplating discontinuing the fenofibrate as he is concerned this is causing his muscle pain. Since his last visit, he has stopped both the little and fenofibrate. Prior to his last blood work, he went out with some friends and was drinking bourbon in his triglycerides were 1400. Therefore difficult to know what his LDL actually is without measuring a direct LDL. We discussed options as alternatives to statins including WelChol and his idea, however he is not interested in any further medications.  Matthew Moreno has no new complaints today. 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 He is undergone radiation therapy and is apparently clear of this. Unfortunately he was intolerant to the statins and cannot afford to take fenofibrate. He is currently not on any therapy for lipid lowering and does have moderate coronary artery disease. We have discussed this in the past and felt there were a few additional options. He does say that he lost 20 or 30 pounds recently due to his cancer and wonders if his cholesterol is improved.  01/10/2016  Mr. Sadowski returns today for follow-up. He seems  to be doing fairly well although recently he's had some worsening shortness of breath and fatigue. This is mostly with exertion. 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 Several days he's had to go back home and go to sleep for the rest of the day. He does not associate this with the hot weather. He denies any chest pain with this but has been more fatigued recently. He is concern is a friend of his had some similar symptoms and underwent a bypass surgery. Matthew Moreno does have a history of moderate coronary artery disease by catheterization in 2010. He's had difficult to control cholesterol due to intolerance to statins but has been able to take Livalo. Recent lab work indicated much higher triglycerides been previously, in fact greater than 400 therefore LDL was not calculated, however LDL last year was 102.  01/11/2017  Matthew Moreno is seen today in follow-up. Overall he is asymptomatic. He was having some shortness of breath but that improved. We did a stress test last year which was negative for ischemia. He's gained about 15 pounds since we last saw him, he feels that he's eating last however gaining weight. He denies any swelling. He's not had any significant muscle mass. He generally eats a large "country" breakfast, usually a sandwich for lunch and very little if anything for dinner. He's had elevated triglycerides in the past and an LDL over 100. I started him on Livalo which she is tolerating, but is not made a significant improvement in triglycerides rather his LDL was lower in the  60s. Fasting blood glucose is been in the low 100s in the past but he is not known to be diabetic.  01/12/2018  Matthew Moreno is seen today in follow-up.  Overall he continues to be asymptomatic.  He denies any chest pain or worsening shortness of breath.  Weight has been stable.  His last lipid profile 9 months ago by his PCP was not a goal showing total cholesterol 221, HDL 42,  triglycerides 345 and LDL 130.  Non-HDL cholesterol significantly elevated at 180 suggesting significant residual risk.  PMHx:  Past Medical History:  Diagnosis Date  . CAD (coronary artery disease) 2008  . Dyslipidemia   . Family history of heart disease   . Hypertension   . Nephrolithiasis    H/O multiple "kidney stones", Lithotripsies, J-Stents  . Panic attacks   . S/P radiation therapy 02/12/2014-04/02/2014   Left tonsil and bilateral neck / 70 Gy in 35 fractions to gross disease, 63 Gy in 35 fractions to high risk nodalechelons, and 56 Gy in 35 fractions to intermediate risk nodal echelons   . Statin intolerance   . Tonsil cancer ([place]) 01/05/14   left    Past Surgical History:  Procedure Laterality Date  . BICEPS TENDON REPAIR Left   . CARDIAC CATHETERIZATION  06/19/2008   normal left main, Cfx with 3 OMs (50% osital narrowing in OM1), LAD densely calcified in the proximal half of vessel with eccentric narrowing in mid region (40-50% severity), diag 1 & 2 free of disease, RCA with less than 30% narrowing in midportion (Dr. Loni Muse. [place])  . COLONOSCOPY  2010   Dr. Ardis Hughs  . EYE SURGERY Bilateral    Cataract    FAMHx:  Family History  Problem Relation Age of Onset  . Heart Problems Mother   . Heart Problems Father     SOCHx:   reports that he quit smoking about 34 years ago. His smoking use included cigarettes. He has a 15.00 pack-year smoking history. He has never used smokeless tobacco. He reports that he drinks about 3.0 standard drinks of alcohol per week. He reports that he does not use drugs.  ALLERGIES:  Allergies  Allergen Reactions  . Bystolic [Nebivolol Hcl]   . Lipitor [Atorvastatin] Other (See Comments)    Muscle aches with statins  . Lisinopril   . Pravastatin   . Red Yeast Rice [Cholestin]   . Sulfonamide Derivatives     REACTION: hives  . Zocor [Simvastatin]     ROS: Pertinent items noted in HPI and remainder of comprehensive ROS otherwise  negative.  HOME MEDS: Current Outpatient Medications  Medication Sig Dispense Refill  . amoxicillin-clavulanate (AUGMENTIN) 875-125 MG tablet Take 1 tablet by mouth 2 (two) times daily. 20 tablet 0  . aspirin 81 MG tablet Take 160 mg by mouth daily.    . calcium carbonate (TUMS EX) 750 MG chewable tablet Chew 1 tablet by mouth daily.    . fenofibrate 160 MG tablet TAKE 1 TABLET(160 MG) BY MOUTH DAILY 30 tablet 0  . metoprolol tartrate (LOPRESSOR) 25 MG tablet TAKE 1/2 TABLET(12.5 MG) BY MOUTH TWICE DAILY 30 tablet 0  . Pitavastatin Calcium (LIVALO) 2 MG TABS Take 1 tablet (2 mg total) by mouth daily. 28 tablet 0  . sodium fluoride (FLUORISHIELD) 1.1 % GEL dental gel Instill 1 drop into each tooth space of fluoride tray. Place over teeth for 5 minutes. Remove. Spit out excess. Repeat nightly. 120 mL prn  . ALPRAZolam (XANAX) 0.5 MG tablet  Take 1 tablet (0.5 mg total) by mouth at bedtime as needed. (Patient not taking: Reported on 01/12/2018) 30 tablet 4  . azithromycin (ZITHROMAX) 250 MG tablet 2 tablets day 1, then 1 tablet days 2-4 (Patient not taking: Reported on 08/03/2017) 6 tablet 0  . Multiple Vitamins-Minerals (MULTIVITAMIN WITH MINERALS) tablet Take 1 tablet by mouth daily. Reported on 11/11/2015    . VENTOLIN HFA 108 (90 Base) MCG/ACT inhaler INHALE 2 PUFFS BY MOUTH EVERY 6 HOURS AS NEEDED FOR WHEEZING OR SHORTNESS OF BREATH (Patient not taking: Reported on 01/12/2018) 18 g 0   No current facility-administered medications for this visit.    Facility-Administered Medications Ordered in Other Visits  Medication Dose Route Frequency Provider Last Rate Last Dose  . influenza  inactive virus vaccine (FLUZONE/FLUARIX) injection 0.5 mL  0.5 mL Intramuscular Once Denita Lung, MD        LABS/IMAGING: No results found for this or any previous visit (from the past 48 hour(s)). No results found.  VITALS: BP 130/80 (BP Location: Right Arm, Patient Position: Sitting, Cuff Size: Normal)    Pulse 73   Ht 5\' 7"  (1.702 m)   Wt 156 lb 9.6 oz (71 kg)   BMI 24.53 kg/m   EXAM: General appearance: alert and no distress Neck: no carotid bruit, no JVD and thyroid not enlarged, symmetric, no tenderness/mass/nodules Lungs: clear to auscultation bilaterally Heart: regular rate and rhythm, S1, S2 normal, no murmur, click, rub or gallop Abdomen: soft, non-tender; bowel sounds normal; no masses,  no organomegaly Extremities: extremities normal, atraumatic, no cyanosis or edema Pulses: 2+ and symmetric Skin: Skin color, texture, turgor normal. No rashes or lesions Neurologic: Grossly normal Psych: Pleasant  EKG: Sinus rhythm 73, minimal voltage criteria for LVH-personally reviewed  ASSESSMENT: 1. Moderate nonobstructive coronary artery disease (2010 cath) 2. Hypertension 3. Dyslipidemia - on Livalo 4. Recent tonsillar cancer 5. DOE - LVEF 60-65%, mild AI (no discernible murmur)  PLAN: 1.   Matthew Moreno denies any new chest pain symptoms.  Of note he had moderate nonobstructive coronary disease by 2010 heart cath.  Blood pressure is well controlled.  He is tolerated Livalo, but his cholesterol still not at goal with LDL 130 and triglycerides significantly elevated.  We will go ahead and repeat a lipid profile, but may need to consider PCSK9 and/or Vascepa to lower residual risk.  Follow-up annually or sooner as necessary.  Pixie Casino, MD, [facility], [place] Director of the Advanced Lipid Disorders &  Cardiovascular Risk Reduction Clinic Diplomate of the American Board of Clinical Lipidology Attending Cardiologist  Direct Dial: [phone]  Fax: [phone]  Website:  [email or web address] 01/12/2018, 4:33 PM

## 2018-01-12 NOTE — Patient Instructions (Signed)
Your physician recommends that you return for lab work FASTING to check cholesterol   Your physician wants you to follow-up in: ONE YEAR with Dr. Hilty. You will receive a reminder letter in the mail two months in advance. If you don't receive a letter, please call our office to schedule the follow-up appointment.   

## 2018-01-13 ENCOUNTER — Encounter: Payer: Self-pay | Admitting: Internal Medicine

## 2018-01-25 DIAGNOSIS — E781 Pure hyperglyceridemia: Secondary | ICD-10-CM | POA: Diagnosis not present

## 2018-01-26 ENCOUNTER — Telehealth: Payer: Self-pay

## 2018-01-26 DIAGNOSIS — E781 Pure hyperglyceridemia: Secondary | ICD-10-CM

## 2018-01-26 LAB — LIPID PANEL
CHOL/HDL RATIO: 5.1 ratio — AB (ref 0.0–5.0)
Cholesterol, Total: 173 mg/dL (ref 100–199)
HDL: 34 mg/dL — ABNORMAL LOW (ref >39)
LDL CALC: 63 mg/dL (ref 0–99)
Triglycerides: 380 mg/dL — ABNORMAL HIGH (ref 0–149)
VLDL CHOLESTEROL CAL: 76 mg/dL — AB (ref 5–40)

## 2018-01-26 MED ORDER — PITAVASTATIN CALCIUM 2 MG PO TABS: 1.0000 | ORAL_TABLET | Freq: Every day | ORAL | 0 refills | Status: DC

## 2018-01-26 NOTE — Telephone Encounter (Signed)
Pt was give 21 day supply consisting of 7 tablets and 3 boxes. Patient has also made an AWV.

## 2018-01-28 ENCOUNTER — Telehealth: Payer: Self-pay | Admitting: Internal Medicine

## 2018-01-28 ENCOUNTER — Other Ambulatory Visit: Payer: Self-pay | Admitting: Internal Medicine

## 2018-01-28 MED ORDER — METOPROLOL TARTRATE 25 MG PO TABS: ORAL_TABLET | ORAL | 11 refills | Status: DC

## 2018-01-28 NOTE — Telephone Encounter (Signed)
Notes recorded by Matthew Casino, MD on 01/28/2018 at 9:02 AM EDT Marked improvement in lipid profile. LDL at goal, but trigs remain high. Would recommend adding Vascepa.  Dr. Lemmie Evens ___________ [name] & agrees w/plan to add Vascepa  PA submitted via [email or web address] (Key: [id number])

## 2018-02-01 MED ORDER — ICOSAPENT ETHYL 1 G PO CAPS: 2.0000 g | ORAL_CAPSULE | Freq: Two times a day (BID) | ORAL | 11 refills | Status: DC

## 2018-02-01 NOTE — Telephone Encounter (Signed)
Received faxed notice from [place] that patient has been approved for Vascepa 1gm capsule until 01/29/2019.

## 2018-02-01 NOTE — Telephone Encounter (Signed)
Patient called & notified vascepa has been approved. Advised will send Rx to pharmacy and requested that he notify our office if medication is not cost effective for him. He voiced understanding.

## 2018-02-01 NOTE — Addendum Note (Signed)
Addended by: Fidel Levy on: 02/01/2018 01:17 PM   Modules accepted: Orders

## 2018-02-01 NOTE — Telephone Encounter (Signed)
Patient called in concerning cost of Vascepa. He states his copay would be $135.72/month which he cannot afford. He does not wish to take the medication at this time. Advised will notify him should a patient assistance program become available.

## 2018-02-01 NOTE — Addendum Note (Signed)
Addended by: Fidel Levy on: 02/01/2018 03:13 PM   Modules accepted: Orders

## 2018-02-08 ENCOUNTER — Other Ambulatory Visit: Payer: Self-pay | Admitting: Internal Medicine

## 2018-02-24 ENCOUNTER — Telehealth: Payer: Self-pay

## 2018-02-24 DIAGNOSIS — E781 Pure hyperglyceridemia: Secondary | ICD-10-CM

## 2018-02-24 MED ORDER — PITAVASTATIN CALCIUM 2 MG PO TABS: 1.0000 | ORAL_TABLET | Freq: Every day | ORAL | 0 refills | Status: DC

## 2018-02-24 NOTE — Telephone Encounter (Signed)
Patient called for samples of Livalo. 21 day supply is being given.

## 2018-03-08 ENCOUNTER — Other Ambulatory Visit (INDEPENDENT_AMBULATORY_CARE_PROVIDER_SITE_OTHER): Payer: Medicare Other

## 2018-03-08 DIAGNOSIS — Z23 Encounter for immunization: Secondary | ICD-10-CM

## 2018-03-12 ENCOUNTER — Other Ambulatory Visit: Payer: Self-pay | Admitting: Internal Medicine

## 2018-03-29 ENCOUNTER — Telehealth: Payer: Self-pay

## 2018-03-29 DIAGNOSIS — E781 Pure hyperglyceridemia: Secondary | ICD-10-CM

## 2018-03-29 MED ORDER — PITAVASTATIN CALCIUM 2 MG PO TABS: 1.0000 | ORAL_TABLET | Freq: Every day | ORAL | 0 refills | Status: DC

## 2018-03-29 NOTE — Telephone Encounter (Signed)
Patient has called for samples of Livalo. Patient is given a 28 day supply.

## 2018-04-05 NOTE — Progress Notes (Signed)
Matthew Moreno presents for follow up of radiation completed 04/02/2014 to his left tonsil.   Pain issues, if any: He has chronic pain to his neck. He rates it a 6/10.  Using a feeding tube?: No Weight changes, if any:  Wt Readings from Last 3 Encounters:  04/08/18 158 lb 12.8 oz (72 kg)  01/12/18 156 lb 9.6 oz (71 kg)  08/03/17 154 lb 12.8 oz (70.2 kg)   Swallowing issues, if any: He reports trouble swallowing at times related to his vertebrae pressing on his esophagus.  Smoking or chewing tobacco? No Using fluoride trays daily? Daily.  Last ENT visit was on: Dr. Lucia Gaskins 09/2016 Other notable issues, if any:   BP (!) 166/82 (BP Location: Left Arm)   Pulse 77   Temp 98.9 F (37.2 C) (Oral)   Ht 5\' 7"  (1.702 m)   Wt 158 lb 12.8 oz (72 kg)   SpO2 98%   BMI 24.87 kg/m

## 2018-04-08 ENCOUNTER — Encounter: Payer: Self-pay | Admitting: Radiation Oncology

## 2018-04-08 ENCOUNTER — Other Ambulatory Visit: Payer: Self-pay

## 2018-04-08 ENCOUNTER — Ambulatory Visit
Admission: RE | Admit: 2018-04-08 | Discharge: 2018-04-08 | Disposition: A | Discharge status: Home / Self Care | Payer: Medicare Other | Source: Ambulatory Visit | Attending: Radiation Oncology | Admitting: Radiation Oncology

## 2018-04-08 VITALS — BP 166/82 | HR 77 | Temp 98.9°F | Ht 67.0 in | Wt 158.8 lb

## 2018-04-08 DIAGNOSIS — Z7982 Long term (current) use of aspirin: Secondary | ICD-10-CM | POA: Diagnosis not present

## 2018-04-08 DIAGNOSIS — G8929 Other chronic pain: Secondary | ICD-10-CM | POA: Diagnosis not present

## 2018-04-08 DIAGNOSIS — C090 Malignant neoplasm of tonsillar fossa: Secondary | ICD-10-CM | POA: Insufficient documentation

## 2018-04-08 DIAGNOSIS — Z79899 Other long term (current) drug therapy: Secondary | ICD-10-CM | POA: Diagnosis not present

## 2018-04-08 DIAGNOSIS — Z923 Personal history of irradiation: Secondary | ICD-10-CM | POA: Diagnosis not present

## 2018-04-08 DIAGNOSIS — C099 Malignant neoplasm of tonsil, unspecified: Secondary | ICD-10-CM

## 2018-04-08 DIAGNOSIS — Z85818 Personal history of malignant neoplasm of other sites of lip, oral cavity, and pharynx: Secondary | ICD-10-CM | POA: Diagnosis not present

## 2018-04-08 DIAGNOSIS — Z08 Encounter for follow-up examination after completed treatment for malignant neoplasm: Secondary | ICD-10-CM | POA: Diagnosis not present

## 2018-04-08 NOTE — Progress Notes (Signed)
Radiation Oncology         [phone] ________________________________  Name: Matthew Moreno MRN: [id number]  Date: 04/08/2018  DOB: Dec 09, 1945  Follow-Up Visit Note  CC: Denita Lung, MD  [name], *  Diagnosis and Prior Radiotherapy:       ICD-10-CM   1. Malignant neoplasm of tonsillar fossa ([place]) C09.0   2. 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  Narrative:  The patient returns today for routine follow-up of radiation completed 04/02/2014 to his left tonsil.  Last ENT visit with Dr. Lucia Gaskins was in 09/2016.  On review of systems, the patient reports that he is doing well overall. He has chronic pain to his neck (for years) which he rates a 6 out of 10 on pain scale. States he has received epidural shots for this in the past. He does not use a feeding tube. He reports trouble swallowing at times related to his vertebrae pressing on his esophagus. He is not smoking or chewing tobacco. He is using fluoride trays daily. A complete review of systems is obtained and is otherwise negative. Overall, he is pleased with how he is doing.   ALLERGIES:  is allergic to bystolic [nebivolol hcl]; lipitor [atorvastatin]; lisinopril; pravastatin; red yeast rice [cholestin]; sulfonamide derivatives; and zocor [simvastatin].  Meds: Current Outpatient Medications  Medication Sig Dispense Refill  . aspirin 81 MG tablet Take 81 mg by mouth daily.    . calcium carbonate (TUMS EX) 750 MG chewable tablet Chew 1 tablet by mouth daily.    . fenofibrate 160 MG tablet TAKE 1 TABLET(160 MG) BY MOUTH DAILY 30 tablet 9  . metoprolol tartrate (LOPRESSOR) 25 MG tablet TAKE 1/2 TABLET (12.5 MG) BY MOUTH TWICE DAILY 30 tablet 11  . Pitavastatin Calcium (LIVALO) 2 MG TABS Take 1 tablet (2 mg total) by mouth daily. 28 tablet 0  . sodium fluoride  (FLUORISHIELD) 1.1 % GEL dental gel Instill 1 drop into each tooth space of fluoride tray. Place over teeth for 5 minutes. Remove. Spit out excess. Repeat nightly. 120 mL prn  . ALPRAZolam (XANAX) 0.5 MG tablet Take 1 tablet (0.5 mg total) by mouth at bedtime as needed. (Patient not taking: Reported on 04/08/2018) 30 tablet 4  . VENTOLIN HFA 108 (90 Base) MCG/ACT inhaler INHALE 2 PUFFS BY MOUTH EVERY 6 HOURS AS NEEDED FOR WHEEZING OR SHORTNESS OF BREATH (Patient not taking: Reported on 01/12/2018) 18 g 0   No current facility-administered medications for this encounter.    Facility-Administered Medications Ordered in Other Encounters  Medication Dose Route Frequency Provider Last Rate Last Dose  . influenza  inactive virus vaccine (FLUZONE/FLUARIX) injection 0.5 mL  0.5 mL Intramuscular Once Denita Lung, MD        Physical Findings: The patient is in no acute distress. Patient is alert and oriented. Wt Readings from Last 3 Encounters:  04/08/18 158 lb 12.8 oz (72 kg)  01/12/18 156 lb 9.6 oz (71 kg)  08/03/17 154 lb 12.8 oz (70.2 kg)    height is 5\' 7"  (1.702 m) and weight is 158 lb 12.8 oz (72 kg). His oral temperature is 98.9 F (37.2 C). His blood pressure is 166/82 (abnormal) and his pulse is 77. His oxygen saturation is 98%. .  General: Alert and oriented, in no acute distress HEENT: Head is normocephalic.  Dentures were removed for exam. Extraocular movements are intact. Oral cavity and Oropharynx are clear.  Mucous membranes are moist. No lesions in the oral cavity Neck: Neck is unremarkable. No palpable masses in the  cervical or supraclavicular regions. Skin: healed over the neck. Heart: Regular in rate and rhythm with no murmurs, rubs, or gallops.  Chest: Clear to auscultation bilaterally, with no rhonchi, wheezes, or rales.  Abdomen: Soft, nontender, nondistended, with no rigidity or guarding. Normal bowel sounds. Extremities: No cyanosis or edema. Lymphatics: see Neck  Exam Psychiatric: Judgment and insight are intact. Affect is appropriate.  Lab Findings: Lab Results  Component Value Date   WBC 5.5 03/30/2017   HGB 14.7 03/30/2017   HCT 41.1 03/30/2017   MCV 90.9 03/30/2017   PLT 326 03/30/2017    Lab Results  Component Value Date   TSH 2.705 04/02/2017    Radiographic Findings: No new imaging. No results found.  Impression/Plan:    1) Head and Neck Cancer Status: NED, No evidence of recurrence.  2) Nutritional Status: Good  3) Risk Factors: The patient has been educated about risk factors including tobacco abuse; they understand that avoidance of tobacco is important to prevent recurrences as well as other cancers.  4) Swallowing: No difficulty reported.  5) Dental: Encouraged to continue regular followup with dentistry, and dental hygiene including fluoride rinses.  6) Dr. Redmond School has been checking his thyroid function, with the most recent levels resulting normal.  Lab Results  Component Value Date   TSH 2.705 04/02/2017    7) Follow-up in 12 months. The patient was encouraged to call with any issues or questions before then.   _____________________________________   Eppie Gibson, MD   This document serves as a record of services personally performed by Eppie Gibson, MD. It was created on her behalf by Arlyce Harman, a trained medical scribe. The creation of this record is based on the scribe's personal observations and the provider's statements to them. This document has been checked and approved by the attending provider.

## 2018-04-09 ENCOUNTER — Encounter: Payer: Self-pay | Admitting: Radiation Oncology

## 2018-05-01 ENCOUNTER — Encounter (HOSPITAL_COMMUNITY): Payer: Self-pay | Admitting: Oncology

## 2018-05-01 ENCOUNTER — Other Ambulatory Visit: Payer: Self-pay

## 2018-05-01 ENCOUNTER — Emergency Department (HOSPITAL_COMMUNITY): Payer: Medicare Other

## 2018-05-01 ENCOUNTER — Emergency Department (HOSPITAL_COMMUNITY)
Admission: EM | Admit: 2018-05-01 | Discharge: 2018-05-02 | Disposition: A | Discharge status: Home / Self Care | Payer: Medicare Other | Attending: Emergency Medicine | Admitting: Emergency Medicine

## 2018-05-01 DIAGNOSIS — S069X9A Unspecified intracranial injury with loss of consciousness of unspecified duration, initial encounter: Secondary | ICD-10-CM | POA: Diagnosis not present

## 2018-05-01 DIAGNOSIS — S069X1A Unspecified intracranial injury with loss of consciousness of 30 minutes or less, initial encounter: Secondary | ICD-10-CM | POA: Diagnosis not present

## 2018-05-01 DIAGNOSIS — Z87891 Personal history of nicotine dependence: Secondary | ICD-10-CM | POA: Diagnosis not present

## 2018-05-01 DIAGNOSIS — Y939 Activity, unspecified: Secondary | ICD-10-CM | POA: Insufficient documentation

## 2018-05-01 DIAGNOSIS — Z79899 Other long term (current) drug therapy: Secondary | ICD-10-CM | POA: Insufficient documentation

## 2018-05-01 DIAGNOSIS — Y999 Unspecified external cause status: Secondary | ICD-10-CM | POA: Insufficient documentation

## 2018-05-01 DIAGNOSIS — X58XXXA Exposure to other specified factors, initial encounter: Secondary | ICD-10-CM | POA: Insufficient documentation

## 2018-05-01 DIAGNOSIS — Y929 Unspecified place or not applicable: Secondary | ICD-10-CM | POA: Insufficient documentation

## 2018-05-01 DIAGNOSIS — R0902 Hypoxemia: Secondary | ICD-10-CM | POA: Diagnosis not present

## 2018-05-01 DIAGNOSIS — S0990XA Unspecified injury of head, initial encounter: Secondary | ICD-10-CM | POA: Insufficient documentation

## 2018-05-01 DIAGNOSIS — I2510 Atherosclerotic heart disease of native coronary artery without angina pectoris: Secondary | ICD-10-CM | POA: Diagnosis not present

## 2018-05-01 DIAGNOSIS — I10 Essential (primary) hypertension: Secondary | ICD-10-CM | POA: Insufficient documentation

## 2018-05-01 DIAGNOSIS — R58 Hemorrhage, not elsewhere classified: Secondary | ICD-10-CM | POA: Diagnosis not present

## 2018-05-01 DIAGNOSIS — S0081XA Abrasion of other part of head, initial encounter: Secondary | ICD-10-CM | POA: Diagnosis not present

## 2018-05-01 DIAGNOSIS — R000 Tachycardia, unspecified: Secondary | ICD-10-CM | POA: Diagnosis not present

## 2018-05-01 DIAGNOSIS — Z7982 Long term (current) use of aspirin: Secondary | ICD-10-CM | POA: Diagnosis not present

## 2018-05-01 DIAGNOSIS — W19XXXA Unspecified fall, initial encounter: Secondary | ICD-10-CM

## 2018-05-01 LAB — CBC WITH DIFFERENTIAL/PLATELET
Abs Immature Granulocytes: 0.04 10*3/uL (ref 0.00–0.07)
Basophils Absolute: 0.1 10*3/uL (ref 0.0–0.1)
Basophils Relative: 1 %
Eosinophils Absolute: 0.1 10*3/uL (ref 0.0–0.5)
Eosinophils Relative: 1 %
HEMATOCRIT: 44.8 % (ref 39.0–52.0)
Hemoglobin: 14.3 g/dL (ref 13.0–17.0)
Immature Granulocytes: 1 %
Lymphocytes Relative: 29 %
Lymphs Abs: 2 10*3/uL (ref 0.7–4.0)
MCH: 30 pg (ref 26.0–34.0)
MCHC: 31.9 g/dL (ref 30.0–36.0)
MCV: 93.9 fL (ref 80.0–100.0)
Monocytes Absolute: 0.7 10*3/uL (ref 0.1–1.0)
Monocytes Relative: 10 %
NRBC: 0 % (ref 0.0–0.2)
Neutro Abs: 4.2 10*3/uL (ref 1.7–7.7)
Neutrophils Relative %: 58 %
Platelets: 338 10*3/uL (ref 150–400)
RBC: 4.77 MIL/uL (ref 4.22–5.81)
RDW: 12.4 % (ref 11.5–15.5)
WBC: 7.1 10*3/uL (ref 4.0–10.5)

## 2018-05-01 LAB — BASIC METABOLIC PANEL
Anion gap: 17 — ABNORMAL HIGH (ref 5–15)
BUN: 12 mg/dL (ref 8–23)
CO2: 19 mmol/L — ABNORMAL LOW (ref 22–32)
CREATININE: 1.27 mg/dL — AB (ref 0.61–1.24)
Calcium: 9 mg/dL (ref 8.9–10.3)
Chloride: 105 mmol/L (ref 98–111)
GFR calc Af Amer: >60 mL/min (ref >60)
GFR calc non Af Amer: 56 mL/min — ABNORMAL LOW (ref >60)
Glucose, Bld: 110 mg/dL — ABNORMAL HIGH (ref 70–99)
Potassium: 3.5 mmol/L (ref 3.5–5.1)
Sodium: 141 mmol/L (ref 135–145)

## 2018-05-01 LAB — ETHANOL: Alcohol, Ethyl (B): 299 mg/dL — ABNORMAL HIGH (ref <10)

## 2018-05-01 MED ORDER — LIDOCAINE HCL (PF) 1 % IJ SOLN
5.0000 mL | Freq: Once | INTRAMUSCULAR | Status: AC
  Administered 2018-05-01: 5 mL
  Filled 2018-05-01: qty 5

## 2018-05-01 MED ORDER — TETANUS-DIPHTH-ACELL PERTUSSIS 5-2.5-18.5 LF-MCG/0.5 IM SUSP
0.5000 mL | Freq: Once | INTRAMUSCULAR | Status: AC
  Administered 2018-05-01: 0.5 mL via INTRAMUSCULAR
  Filled 2018-05-01: qty 0.5

## 2018-05-01 NOTE — ED Notes (Signed)
Patient transported to CT 

## 2018-05-01 NOTE — Discharge Instructions (Addendum)
Please use bacitracin or Neosporin on abrasions over face. Do not rub, when you shower let the water rash over the abrasions, then apply ointment afterwards.
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
## 2018-05-04 ENCOUNTER — Ambulatory Visit: Payer: Medicare Other | Admitting: Family Medicine

## 2018-05-04 VITALS — BP 124/80 | HR 65 | Temp 98.2°F | Wt 152.8 lb

## 2018-05-04 DIAGNOSIS — T07XXXA Unspecified multiple injuries, initial encounter: Secondary | ICD-10-CM | POA: Diagnosis not present
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
## 2018-05-16 ENCOUNTER — Telehealth: Payer: Self-pay | Admitting: Family Medicine

## 2018-05-16 DIAGNOSIS — E781 Pure hyperglyceridemia: Secondary | ICD-10-CM

## 2018-05-16 MED ORDER — PITAVASTATIN CALCIUM 2 MG PO TABS: 1.0000 | ORAL_TABLET | Freq: Every day | ORAL | 0 refills | Status: DC

## 2018-05-16 NOTE — Telephone Encounter (Signed)
Pt called requesting samples of Livalo, 4 boxes given

## 2018-05-24 ENCOUNTER — Ambulatory Visit: Payer: Medicare Other | Admitting: Family Medicine

## 2018-05-24 ENCOUNTER — Encounter: Payer: Self-pay | Admitting: Family Medicine

## 2018-05-24 VITALS — BP 140/82 | HR 78 | Ht 66.0 in | Wt 152.8 lb

## 2018-05-24 DIAGNOSIS — K219 Gastro-esophageal reflux disease without esophagitis: Secondary | ICD-10-CM

## 2018-05-24 DIAGNOSIS — Z1211 Encounter for screening for malignant neoplasm of colon: Secondary | ICD-10-CM

## 2018-05-24 DIAGNOSIS — Z9842 Cataract extraction status, left eye: Secondary | ICD-10-CM

## 2018-05-24 DIAGNOSIS — Z0000 Encounter for general adult medical examination without abnormal findings: Secondary | ICD-10-CM

## 2018-05-24 DIAGNOSIS — E781 Pure hyperglyceridemia: Secondary | ICD-10-CM

## 2018-05-24 DIAGNOSIS — Z8249 Family history of ischemic heart disease and other diseases of the circulatory system: Secondary | ICD-10-CM

## 2018-05-24 DIAGNOSIS — Z9841 Cataract extraction status, right eye: Secondary | ICD-10-CM

## 2018-05-24 DIAGNOSIS — C099 Malignant neoplasm of tonsil, unspecified: Secondary | ICD-10-CM

## 2018-05-24 DIAGNOSIS — I2510 Atherosclerotic heart disease of native coronary artery without angina pectoris: Secondary | ICD-10-CM

## 2018-05-24 DIAGNOSIS — I10 Essential (primary) hypertension: Secondary | ICD-10-CM

## 2018-05-24 DIAGNOSIS — M1990 Unspecified osteoarthritis, unspecified site: Secondary | ICD-10-CM

## 2018-05-24 LAB — CBC WITH DIFFERENTIAL/PLATELET
Basophils Absolute: 0.1 10*3/uL (ref 0.0–0.2)
Basos: 1 %
EOS (ABSOLUTE): 0.1 10*3/uL (ref 0.0–0.4)
Eos: 1 %
Hematocrit: 44.6 % (ref 37.5–51.0)
Hemoglobin: 14.8 g/dL (ref 13.0–17.7)
Immature Grans (Abs): 0 10*3/uL (ref 0.0–0.1)
Immature Granulocytes: 0 %
Lymphocytes Absolute: 1.1 10*3/uL (ref 0.7–3.1)
Lymphs: 19 %
MCH: 31.4 pg (ref 26.6–33.0)
MCHC: 33.2 g/dL (ref 31.5–35.7)
MCV: 95 fL (ref 79–97)
Monocytes Absolute: 0.6 10*3/uL (ref 0.1–0.9)
Monocytes: 10 %
Neutrophils Absolute: 4.1 10*3/uL (ref 1.4–7.0)
Neutrophils: 69 %
Platelets: 382 10*3/uL (ref 150–450)
RBC: 4.72 x10E6/uL (ref 4.14–5.80)
RDW: 12.7 % (ref 11.6–15.4)
WBC: 5.9 10*3/uL (ref 3.4–10.8)

## 2018-05-24 LAB — LIPID PANEL
Chol/HDL Ratio: 5.5 ratio — ABNORMAL HIGH (ref 0.0–5.0)
Cholesterol, Total: 205 mg/dL — ABNORMAL HIGH (ref 100–199)
HDL: 37 mg/dL — ABNORMAL LOW (ref >39)
LDL Calculated: 122 mg/dL — ABNORMAL HIGH (ref 0–99)
Triglycerides: 231 mg/dL — ABNORMAL HIGH (ref 0–149)
VLDL Cholesterol Cal: 46 mg/dL — ABNORMAL HIGH (ref 5–40)

## 2018-05-24 LAB — COMPREHENSIVE METABOLIC PANEL
ALBUMIN: 4.6 g/dL (ref 3.5–4.8)
ALT: 26 IU/L (ref 0–44)
AST: 25 IU/L (ref 0–40)
Albumin/Globulin Ratio: 1.5 (ref 1.2–2.2)
Alkaline Phosphatase: 61 IU/L (ref 39–117)
BUN / CREAT RATIO: 11 (ref 10–24)
BUN: 15 mg/dL (ref 8–27)
Bilirubin Total: 0.3 mg/dL (ref 0.0–1.2)
CO2: 22 mmol/L (ref 20–29)
Calcium: 10.5 mg/dL — ABNORMAL HIGH (ref 8.6–10.2)
Chloride: 102 mmol/L (ref 96–106)
Creatinine, Ser: 1.33 mg/dL — ABNORMAL HIGH (ref 0.76–1.27)
GFR calc Af Amer: 61 mL/min/{1.73_m2} (ref >59)
GFR calc non Af Amer: 53 mL/min/{1.73_m2} — ABNORMAL LOW (ref >59)
Globulin, Total: 3 g/dL (ref 1.5–4.5)
Glucose: 97 mg/dL (ref 65–99)
Potassium: 4.6 mmol/L (ref 3.5–5.2)
SODIUM: 140 mmol/L (ref 134–144)
TOTAL PROTEIN: 7.6 g/dL (ref 6.0–8.5)

## 2018-05-24 LAB — POCT URINALYSIS DIP (PROADVANTAGE DEVICE)
Bilirubin, UA: NEGATIVE
Blood, UA: NEGATIVE
Glucose, UA: NEGATIVE mg/dL
Ketones, POC UA: NEGATIVE mg/dL
Leukocytes, UA: NEGATIVE
Nitrite, UA: NEGATIVE
Protein Ur, POC: NEGATIVE mg/dL
Specific Gravity, Urine: 1.025
Urobilinogen, Ur: NEGATIVE
pH, UA: 6 (ref 5.0–8.0)

## 2018-05-24 MED ORDER — FENOFIBRATE 160 MG PO TABS: ORAL_TABLET | ORAL | 3 refills | Status: DC

## 2018-05-24 MED ORDER — METOPROLOL TARTRATE 25 MG PO TABS: ORAL_TABLET | ORAL | 3 refills | Status: DC

## 2018-05-24 MED ORDER — PITAVASTATIN CALCIUM 2 MG PO TABS: 1.0000 | ORAL_TABLET | Freq: Every day | ORAL | 3 refills | Status: DC

## 2018-05-24 NOTE — Progress Notes (Signed)
Matthew Moreno is a 73 y.o. male who presents for annual wellness visit,CPE and follow-up on chronic medical conditions.  He has difficulty with arthritis mainly in the neck but is doing some stretching exercises to help with that.  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  Does have a previous history of coronary disease but has never had a heart attack.  He has had no difficulty with chest pain, shortness of breath, PND or DOE.  His reflux is not causing him any difficulty.  He does have hypertriglyceridemia as well as a family history of heart disease.  He has had difficulty with statins in the past but seems to be doing quite nicely on Livalo at the present time.  He also is taking fenofibrate.  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  He has had previous cataract surgery.  It is time for another colon cancer screening.   Immunizations and Health Maintenance Immunization History  Administered Date(s) Administered  . Influenza Split 03/18/2001, 03/17/2002, 03/18/2005, 02/25/2007, 02/10/2010, 01/29/2011, 02/15/2012  . Influenza, High Dose Seasonal PF 03/13/2013, 02/28/2014, 02/06/2015, 02/21/2016, 02/08/2017, 03/08/2018  . Pneumococcal Conjugate-13 11/11/2015  . Pneumococcal Polysaccharide-23 03/26/2005, 03/24/2007  . Td 06/28/1981, 03/26/2005  . Tdap 11/12/2015, 05/01/2018  . Zoster 05/30/2010   There are no preventive care reminders to display for this patient.  Last colonoscopy: 2010 Last PSA: N/A Dentist: twice a year Ophtho: cataracts Exercise: walking  Other doctors caring for patient include: Edison Simon- Dentist [place]- Cardiology   Advanced Directives:No info given Would patient like information on creating a medical advance directive?: Yes (MAU/Ambulatory/Procedural Areas - Information given)  Depression screen:  See questionnaire below.      Depression screen [facility] 2/9 05/24/2018 04/02/2017 03/30/2017 04/03/2016 08/23/2015  Decreased Interest 0 0 0 0 0  Down, Depressed, Hopeless 0 0 0 0 0  PHQ - 2 Score 0 0 0 0 0    Fall Screen: See Questionaire below.   Fall Risk  05/24/2018 04/02/2017 03/30/2017 04/03/2016 08/23/2015  Falls in the past year? 1 No No No No  Number falls in past yr: 0 - - - -  Injury with Fall? 1 - - - -  Risk for fall due to : Other (Comment) - - - -  Risk for fall due to: Comment scratches - - - -    ADL screen:  See questionnaire below.  Functional Status Survey: Is the patient deaf or have difficulty hearing?: No Does the patient have difficulty seeing, even when wearing glasses/contacts?: No Does the patient have difficulty concentrating, remembering, or making decisions?: No Does the patient have difficulty walking or climbing stairs?: No Does the patient have difficulty dressing or bathing?: No Does the patient have difficulty doing errands alone such as visiting a doctor's office or shopping?: No   Review of Systems  Constitutional: -, -unexpected weight change, -anorexia, -fatigue Allergy: -sneezing, -itching, -congestion Dermatology: denies changing moles, rash, lumps ENT: -runny nose, -ear pain, -sore throat,  Cardiology:  -chest pain, -palpitations, -orthopnea, Respiratory: -cough, -shortness of breath, -dyspnea on exertion, -wheezing,  Gastroenterology: -abdominal pain, -nausea, -vomiting, -diarrhea, -constipation, -dysphagia Hematology: -bleeding or bruising problems Musculoskeletal: -arthralgias, -myalgias, -joint swelling, -back pain, - Ophthalmology: -vision changes,  Urology: -dysuria, -difficulty urinating,  -urinary frequency, -urgency, incontinence Neurology: -, -numbness, , -memory loss, -falls, -dizziness    PHYSICAL EXAM:  BP 140/82   Pulse 78   Ht 5\' 6"  (1.676 m)   Wt 152 lb 12.8 oz (69.3 kg)   BMI 24.66 kg/m   General Appearance: Alert, cooperative, no distress,  appears stated age Head: Normocephalic, without obvious abnormality, atraumatic Eyes: PERRL, conjunctiva/corneas clear, EOM's intact, fundi benign Ears: Normal TM's and external ear canals Nose: Nares normal, mucosa normal, no drainage or sinus   tenderness Throat: Lips, mucosa, and tongue normal; teeth and gums normal Neck: Supple, no lymphadenopathy, thyroid:no enlargement/tenderness/nodules; no carotid bruit or JVD Lungs: Clear to auscultation bilaterally without wheezes, rales or ronchi; respirations unlabored Heart: Regular rate and rhythm, S1 and S2 normal, no murmur, rub or gallop Abdomen: Soft, non-tender, nondistended, normoactive bowel sounds, no masses, no hepatosplenomegaly Extremities: No clubbing, cyanosis or edema Pulses: 2+ and symmetric all extremities Skin: Skin color, texture, turgor normal, no rashes or lesions Lymph nodes: Cervical, supraclavicular, and axillary nodes normal Neurologic: CNII-XII intact, normal strength, sensation and gait; reflexes 2+ and symmetric throughout   Psych: Normal mood, affect, hygiene and grooming  ASSESSMENT/PLAN: Routine general medical examination at a health care facility - Plan: POCT Urinalysis DIP (Proadvantage Device), CBC with Differential/Platelet, Comprehensive metabolic panel, Lipid panel  Screening for colon cancer - Plan: Cologuard  Arthritis  Essential hypertension - Plan: CBC with Differential/Platelet, Comprehensive metabolic panel, metoprolol tartrate (LOPRESSOR) 25 MG tablet  Coronary artery disease involving native coronary artery of native heart without angina pectoris - Plan: CBC with Differential/Platelet, Comprehensive metabolic panel, Lipid panel, metoprolol tartrate (LOPRESSOR) 25 MG tablet  Gastroesophageal reflux disease, esophagitis presence not specified  Hypertriglyceridemia - Plan: Lipid panel, Pitavastatin Calcium (LIVALO) 2 MG TABS, fenofibrate 160 MG tablet  Family history of heart disease - Plan: CBC  with Differential/Platelet, Comprehensive metabolic panel, Lipid panel  H/O bilateral cataract extraction  Tonsil cancer ([place]) Overall he is doing quite well.  Continue on present medications.  healthy diet and alcohol recommendations (less than or equal to 2 drinks/day) reviewed; Marland Kitchen Immunization recommendations discussed.  Colonoscopy recommendations reviewed.   Medicare Attestation I have personally reviewed: The patient's medical and social history Their use of alcohol, tobacco or illicit drugs Their current medications and supplements The patient's functional ability including ADLs,fall risks, home safety risks, cognitive, and hearing and visual impairment Diet and physical activities Evidence for depression or mood disorders  The patient's weight, height, and BMI have been recorded in the chart.  I have made referrals, counseling, and provided education to the patient based on review of the above and I have provided the patient with a written personalized care plan for preventive services.     Jill Alexanders, MD   05/24/2018

## 2018-05-25 ENCOUNTER — Encounter: Payer: Self-pay | Admitting: Family Medicine

## 2018-05-25 MED ORDER — PITAVASTATIN CALCIUM 4 MG PO TABS: 1.0000 | ORAL_TABLET | Freq: Every day | ORAL | 3 refills | Status: DC

## 2018-05-25 NOTE — Addendum Note (Signed)
Addended by: Denita Lung on: 05/25/2018 12:47 PM   Modules accepted: Orders

## 2018-05-30 DIAGNOSIS — Z1211 Encounter for screening for malignant neoplasm of colon: Secondary | ICD-10-CM | POA: Diagnosis not present

## 2018-05-30 LAB — COLOGUARD: Cologuard: NEGATIVE

## 2018-06-06 ENCOUNTER — Telehealth: Payer: Self-pay

## 2018-06-06 NOTE — Telephone Encounter (Signed)
Called pt to advise of negative cologuard. LVM  [place]

## 2018-07-05 ENCOUNTER — Telehealth: Payer: Self-pay

## 2018-07-05 DIAGNOSIS — I10 Essential (primary) hypertension: Secondary | ICD-10-CM

## 2018-07-05 DIAGNOSIS — I2510 Atherosclerotic heart disease of native coronary artery without angina pectoris: Secondary | ICD-10-CM

## 2018-07-05 MED ORDER — PITAVASTATIN CALCIUM 4 MG PO TABS: 1.0000 | ORAL_TABLET | Freq: Every day | ORAL | 3 refills | Status: DC

## 2018-07-05 NOTE — Telephone Encounter (Signed)
Patient called to get samples of Livalo 4mg 

## 2018-07-13 ENCOUNTER — Telehealth: Payer: Self-pay | Admitting: Family Medicine

## 2018-07-13 MED ORDER — ALPRAZOLAM 0.5 MG PO TABS: 0.5000 mg | ORAL_TABLET | Freq: Every evening | ORAL | 1 refills | Status: DC | PRN

## 2018-07-13 NOTE — Telephone Encounter (Signed)
Pt requested refill Xanax to [facility]

## 2018-08-17 ENCOUNTER — Other Ambulatory Visit: Payer: Self-pay

## 2018-08-17 ENCOUNTER — Ambulatory Visit: Payer: Medicare Other | Admitting: Family Medicine

## 2018-08-17 ENCOUNTER — Encounter: Payer: Self-pay | Admitting: Family Medicine

## 2018-08-17 VITALS — Wt 156.0 lb

## 2018-08-17 DIAGNOSIS — J040 Acute laryngitis: Secondary | ICD-10-CM

## 2018-08-17 DIAGNOSIS — R0989 Other specified symptoms and signs involving the circulatory and respiratory systems: Secondary | ICD-10-CM

## 2018-08-17 DIAGNOSIS — R059 Cough, unspecified: Secondary | ICD-10-CM

## 2018-08-17 DIAGNOSIS — R05 Cough: Secondary | ICD-10-CM

## 2018-08-17 DIAGNOSIS — Z8709 Personal history of other diseases of the respiratory system: Secondary | ICD-10-CM

## 2018-08-17 MED ORDER — AMOXICILLIN-POT CLAVULANATE 875-125 MG PO TABS: 1.0000 | ORAL_TABLET | Freq: Two times a day (BID) | ORAL | 0 refills | Status: DC

## 2018-08-17 NOTE — Progress Notes (Signed)
Documentation for Telephone encounter:  This telephone service is not related to other E/M service within previous 7 days.  Patient consented to the consult.  This telephone consult involved patient  and myself, Harland Dingwall, NP-C   Chief Complaint  Patient presents with  . sinus infection    sinus infection, start friday, coughing up phelgm, runny nose, sinus pressure.     Subjective: Complains of a 3 day history of chest congestion, cough, wheezing and hoarseness. This was preceeded by rhinorrhea, watery and itching eyes and nasal congestion.   Reports history of bronchitis this time of year for the past couple of years. Is not currently taking any allergy medication.   Denies fever, chills, headache, chest pain, palpitations, abdominal pain, N/V/D, LE edema.   Does not smoke.  No recent antibiotics.   Denies sick contacts. He has been home alone for over 2 weeks.   Reviewed allergies, medications, past medical, surgical, family, and social history.    Objective: Wt 156 lb (70.8 kg)   BMI 25.18 kg/m   Alert and oriented and in no acute distress. Speaking in complete sentences without difficulty. Voice is hoarse.    Assessment: Chest congestion  Cough  Laryngitis    Plan: He is not in any acute distress.  No red flag symptoms.  Reports history of bronchitis that requires antibiotics this time of year.  Discussed that allergies appear to be contributing to his symptoms.  Advised he may try a nondrowsy antihistamine or even a steroid nasal spray to see if this helps.  Encouraged him to try Mucinex for chest congestion and cough.  He has an albuterol inhaler at home that he may use as well temporarily. Discussed that he is on the cusp of this being a viral versus bacterial infection.  I will prescribe an antibiotic and he will treat his symptoms and if not improving in the next 2 to 3 days he may start the antibiotic.  Certainly if he worsens in the next couple of  days he may also start the antibiotic and follow-up if not back to baseline.   Time involving medical discussion was 8 minutes.  99441 (5-71min) 99442 (11-51min) 99443 (21-33min)

## 2018-09-04 ENCOUNTER — Encounter (HOSPITAL_COMMUNITY): Payer: Self-pay | Admitting: Emergency Medicine

## 2018-09-04 ENCOUNTER — Encounter (HOSPITAL_COMMUNITY): Payer: Self-pay | Admitting: Anesthesiology

## 2018-09-04 ENCOUNTER — Encounter (HOSPITAL_COMMUNITY)
Admission: EM | Disposition: A | Discharge status: Home / Self Care | Payer: Self-pay | Source: Home / Self Care | Attending: Emergency Medicine

## 2018-09-04 ENCOUNTER — Ambulatory Visit (HOSPITAL_COMMUNITY)
Admission: EM | Admit: 2018-09-04 | Discharge: 2018-09-05 | Disposition: A | Discharge status: Home / Self Care | Payer: Medicare Other | Attending: Emergency Medicine | Admitting: Emergency Medicine

## 2018-09-04 ENCOUNTER — Other Ambulatory Visit: Payer: Self-pay

## 2018-09-04 ENCOUNTER — Emergency Department (HOSPITAL_COMMUNITY): Payer: Medicare Other

## 2018-09-04 DIAGNOSIS — T18128A Food in esophagus causing other injury, initial encounter: Secondary | ICD-10-CM | POA: Diagnosis not present

## 2018-09-04 DIAGNOSIS — Z923 Personal history of irradiation: Secondary | ICD-10-CM | POA: Diagnosis not present

## 2018-09-04 DIAGNOSIS — Z79899 Other long term (current) drug therapy: Secondary | ICD-10-CM | POA: Insufficient documentation

## 2018-09-04 DIAGNOSIS — Z888 Allergy status to other drugs, medicaments and biological substances status: Secondary | ICD-10-CM | POA: Diagnosis not present

## 2018-09-04 DIAGNOSIS — Z85818 Personal history of malignant neoplasm of other sites of lip, oral cavity, and pharynx: Secondary | ICD-10-CM | POA: Insufficient documentation

## 2018-09-04 DIAGNOSIS — Z882 Allergy status to sulfonamides status: Secondary | ICD-10-CM | POA: Insufficient documentation

## 2018-09-04 DIAGNOSIS — Z7982 Long term (current) use of aspirin: Secondary | ICD-10-CM | POA: Diagnosis not present

## 2018-09-04 DIAGNOSIS — I10 Essential (primary) hypertension: Secondary | ICD-10-CM | POA: Diagnosis not present

## 2018-09-04 DIAGNOSIS — X58XXXA Exposure to other specified factors, initial encounter: Secondary | ICD-10-CM | POA: Diagnosis not present

## 2018-09-04 DIAGNOSIS — R05 Cough: Secondary | ICD-10-CM | POA: Diagnosis not present

## 2018-09-04 DIAGNOSIS — I2510 Atherosclerotic heart disease of native coronary artery without angina pectoris: Secondary | ICD-10-CM | POA: Diagnosis not present

## 2018-09-04 DIAGNOSIS — Z87891 Personal history of nicotine dependence: Secondary | ICD-10-CM | POA: Diagnosis not present

## 2018-09-04 DIAGNOSIS — E781 Pure hyperglyceridemia: Secondary | ICD-10-CM | POA: Diagnosis not present

## 2018-09-04 DIAGNOSIS — K222 Esophageal obstruction: Secondary | ICD-10-CM | POA: Insufficient documentation

## 2018-09-04 DIAGNOSIS — K210 Gastro-esophageal reflux disease with esophagitis: Secondary | ICD-10-CM | POA: Diagnosis not present

## 2018-09-04 DIAGNOSIS — E785 Hyperlipidemia, unspecified: Secondary | ICD-10-CM | POA: Diagnosis not present

## 2018-09-04 DIAGNOSIS — R1310 Dysphagia, unspecified: Secondary | ICD-10-CM | POA: Diagnosis not present

## 2018-09-04 HISTORY — PX: ESOPHAGOGASTRODUODENOSCOPY: SHX5428

## 2018-09-04 HISTORY — PX: FOREIGN BODY REMOVAL: SHX962

## 2018-09-04 LAB — CBC WITH DIFFERENTIAL/PLATELET
Abs Immature Granulocytes: 0.04 10*3/uL (ref 0.00–0.07)
Basophils Absolute: 0.1 10*3/uL (ref 0.0–0.1)
Basophils Relative: 1 %
Eosinophils Absolute: 0 10*3/uL (ref 0.0–0.5)
Eosinophils Relative: 0 %
HCT: 44.3 % (ref 39.0–52.0)
Hemoglobin: 15.4 g/dL (ref 13.0–17.0)
Immature Granulocytes: 1 %
Lymphocytes Relative: 9 %
Lymphs Abs: 0.8 10*3/uL (ref 0.7–4.0)
MCH: 31.7 pg (ref 26.0–34.0)
MCHC: 34.8 g/dL (ref 30.0–36.0)
MCV: 91.2 fL (ref 80.0–100.0)
Monocytes Absolute: 0.7 10*3/uL (ref 0.1–1.0)
Monocytes Relative: 8 %
Neutro Abs: 6.9 10*3/uL (ref 1.7–7.7)
Neutrophils Relative %: 81 %
Platelets: 354 10*3/uL (ref 150–400)
RBC: 4.86 MIL/uL (ref 4.22–5.81)
RDW: 12.7 % (ref 11.5–15.5)
WBC: 8.4 10*3/uL (ref 4.0–10.5)
nRBC: 0 % (ref 0.0–0.2)

## 2018-09-04 LAB — COMPREHENSIVE METABOLIC PANEL
ALT: 26 U/L (ref 0–44)
AST: 31 U/L (ref 15–41)
Albumin: 4.2 g/dL (ref 3.5–5.0)
Alkaline Phosphatase: 57 U/L (ref 38–126)
Anion gap: 11 (ref 5–15)
BUN: 13 mg/dL (ref 8–23)
CO2: 27 mmol/L (ref 22–32)
Calcium: 10.5 mg/dL — ABNORMAL HIGH (ref 8.9–10.3)
Chloride: 104 mmol/L (ref 98–111)
Creatinine, Ser: 1.67 mg/dL — ABNORMAL HIGH (ref 0.61–1.24)
GFR calc Af Amer: 47 mL/min — ABNORMAL LOW (ref >60)
GFR calc non Af Amer: 40 mL/min — ABNORMAL LOW (ref >60)
Glucose, Bld: 117 mg/dL — ABNORMAL HIGH (ref 70–99)
Potassium: 4.1 mmol/L (ref 3.5–5.1)
Sodium: 142 mmol/L (ref 135–145)
Total Bilirubin: 0.6 mg/dL (ref 0.3–1.2)
Total Protein: 7.5 g/dL (ref 6.5–8.1)

## 2018-09-04 LAB — TRIGLYCERIDES: Triglycerides: 303 mg/dL — ABNORMAL HIGH (ref <150)

## 2018-09-04 SURGERY — EGD (ESOPHAGOGASTRODUODENOSCOPY)
Anesthesia: Monitor Anesthesia Care

## 2018-09-04 MED ORDER — PROPOFOL 10 MG/ML IV BOLUS
INTRAVENOUS | Status: AC | PRN
  Administered 2018-09-04 (×2): 50 mg via INTRAVENOUS

## 2018-09-04 MED ORDER — FENTANYL CITRATE (PF) 100 MCG/2ML IJ SOLN
INTRAMUSCULAR | Status: AC
  Filled 2018-09-04: qty 4

## 2018-09-04 MED ORDER — OMEPRAZOLE 20 MG PO CPDR: 20.0000 mg | DELAYED_RELEASE_CAPSULE | Freq: Two times a day (BID) | ORAL | 1 refills | Status: DC

## 2018-09-04 MED ORDER — SODIUM CHLORIDE 0.9 % IV SOLN
INTRAVENOUS | Status: AC | PRN
  Administered 2018-09-04: 100 mL/h via INTRAVENOUS

## 2018-09-04 MED ORDER — FENTANYL CITRATE (PF) 100 MCG/2ML IJ SOLN
INTRAMUSCULAR | Status: DC | PRN
  Administered 2018-09-04: 100 ug via INTRAVENOUS

## 2018-09-04 MED ORDER — MIDAZOLAM HCL (PF) 10 MG/2ML IJ SOLN
INTRAMUSCULAR | Status: DC | PRN
  Administered 2018-09-04: 2 mg via INTRAVENOUS

## 2018-09-04 MED ORDER — FENTANYL CITRATE (PF) 100 MCG/2ML IJ SOLN: 25.0000 ug | INTRAMUSCULAR | Status: DC | PRN

## 2018-09-04 MED ORDER — SUCCINYLCHOLINE CHLORIDE 20 MG/ML IJ SOLN
INTRAMUSCULAR | Status: AC | PRN
  Administered 2018-09-04: 150 mg via INTRAVENOUS

## 2018-09-04 MED ORDER — ETOMIDATE 2 MG/ML IV SOLN
INTRAVENOUS | Status: AC | PRN
  Administered 2018-09-04: 20 mg via INTRAVENOUS

## 2018-09-04 MED ORDER — PROPOFOL 1000 MG/100ML IV EMUL
INTRAVENOUS | Status: AC | PRN
  Administered 2018-09-04: 50.073 ug/kg/min via INTRAVENOUS

## 2018-09-04 MED ORDER — FENTANYL CITRATE (PF) 100 MCG/2ML IJ SOLN
INTRAMUSCULAR | Status: AC | PRN
  Administered 2018-09-04: 100 ug via INTRAVENOUS

## 2018-09-04 MED ORDER — HYDROMORPHONE HCL 1 MG/ML IJ SOLN
INTRAMUSCULAR | Status: AC
  Filled 2018-09-04: qty 1

## 2018-09-04 MED ORDER — MIDAZOLAM HCL 5 MG/5ML IJ SOLN
INTRAMUSCULAR | Status: AC | PRN
  Administered 2018-09-04: 2 mg via INTRAVENOUS

## 2018-09-04 MED ORDER — MIDAZOLAM HCL (PF) 5 MG/ML IJ SOLN
INTRAMUSCULAR | Status: AC
  Filled 2018-09-04: qty 3

## 2018-09-04 MED ORDER — PROPOFOL 1000 MG/100ML IV EMUL
5.0000 ug/kg/min | INTRAVENOUS | Status: DC
  Filled 2018-09-04: qty 100

## 2018-09-04 MED ORDER — PROPOFOL 10 MG/ML IV BOLUS
INTRAVENOUS | Status: AC
  Filled 2018-09-04: qty 20

## 2018-09-04 NOTE — ED Notes (Signed)
Patient verbalizes understanding of discharge instructions. Opportunity for questioning and answers were provided. Armband removed by staff, pt discharged from ED.  

## 2018-09-04 NOTE — ED Notes (Signed)
Ambulated pt. Pt has smooth and steady gait. PO challenged and tolerating sprite and crackers with out complaint.

## 2018-09-04 NOTE — ED Provider Notes (Signed)
[facility] Provider Note   CSN: [id number] Arrival date & time: 09/04/18  1705    History   Chief Complaint Chief Complaint  Patient presents with  . food stuck in esophagus    HPI Matthew Moreno is a 73 y.o. male.     HPI  The patient is a 73 year old male with a known history of coronary disease.  He states that he not infrequently, approximately once a month, gets food stuck when he swallows.  He states it is usually meat and this did occur today approximately 1 hour ago.  He was swallowing a piece of meat and felt like it got stuck, he can no longer swallow, he is not able to tolerate his secretions.  He states he usually is able to vomit it up, he has never had an endoscopy that he knows of.  Symptoms are persistent, nothing seems to make it better or worse, no associated diarrhea fever abdominal pain or chest pain  Past Medical History:  Diagnosis Date  . CAD (coronary artery disease) 2008  . Dyslipidemia   . Family history of heart disease   . Hypertension   . Nephrolithiasis    H/O multiple "kidney stones", Lithotripsies, J-Stents  . Panic attacks   . S/P radiation therapy 02/12/2014-04/02/2014   Left tonsil and bilateral neck / 70 Gy in 35 fractions to gross disease, 63 Gy in 35 fractions to high risk nodalechelons, and 56 Gy in 35 fractions to intermediate risk nodal echelons   . Statin intolerance   . 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 H/O bilateral cataract extraction 03/30/2017  . Essential hypertension 01/11/2017  . Arthritis 11/11/2015  . Family history of heart disease 11/11/2015  . 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 Statin intolerance 07/13/2012  . Hypertriglyceridemia 06/23/2011  . Coronary artery disease involving native coronary artery of native heart without angina pectoris 06/23/2011  . Former smoker 06/23/2011  . GERD (gastroesophageal reflux  disease) 03/29/2009    Past Surgical History:  Procedure Laterality Date  . BICEPS TENDON REPAIR Left   . CARDIAC CATHETERIZATION  06/19/2008   normal left main, Cfx with 3 OMs (50% osital narrowing in OM1), LAD densely calcified in the proximal half of vessel with eccentric narrowing in mid region (40-50% severity), diag 1 & 2 free of disease, RCA with less than 30% narrowing in midportion (Dr. Loni Muse. Little)  . COLONOSCOPY  2010   Dr. Ardis Hughs  . EYE SURGERY Bilateral    Cataract        Home Medications    Prior to Admission medications   Medication Sig Start Date End Date Taking? Authorizing Provider  ALPRAZolam Duanne Moron) 0.5 MG tablet Take 1 tablet (0.5 mg total) by mouth at bedtime as needed. 07/13/18   Denita Lung, MD  amoxicillin-clavulanate (AUGMENTIN) 875-125 MG tablet Take 1 tablet by mouth 2 (two) times daily. 08/17/18   Henson, Vickie L, NP-C  aspirin 81 MG tablet Take 81 mg by mouth daily.    [provider]  calcium carbonate (TUMS EX) 750 MG chewable tablet Chew 1 tablet by mouth daily.    [provider]  fenofibrate 160 MG tablet TAKE 1 TABLET(160 MG) BY MOUTH DAILY 05/24/18   Denita Lung, MD  metoprolol tartrate (LOPRESSOR) 25 MG tablet TAKE 1/2 TABLET (12.5 MG) BY MOUTH TWICE DAILY 05/24/18   Denita Lung, MD  omeprazole (PRILOSEC) 20 MG capsule Take 1 capsule (20 mg total) by mouth 2 (two) times daily before a meal for 30 days. 09/04/18 10/04/18  Noemi Chapel, MD  Pitavastatin Calcium (LIVALO) 4 MG TABS Take 1 tablet (4 mg total) by mouth daily. 07/05/18   Denita Lung, MD  sodium fluoride (FLUORISHIELD) 1.1 % GEL dental gel Instill 1 drop into each tooth space of fluoride tray. Place over teeth for 5 minutes. Remove. Spit out excess. Repeat nightly. 01/25/14   Lenn Cal, DDS  VENTOLIN HFA 108 (90 Base) MCG/ACT inhaler INHALE 2 PUFFS BY MOUTH EVERY 6 HOURS AS NEEDED FOR WHEEZING OR SHORTNESS OF BREATH 06/15/17   Denita Lung, MD    Family  History Family History  Problem Relation Age of Onset  . Heart Problems Mother   . Heart Problems Father     Social History Social History   Tobacco Use  . Smoking status: Former Smoker    Packs/day: 0.75    Years: 20.00    Pack years: 15.00    Types: Cigarettes    Last attempt to quit: 11/02/1983    Years since quitting: 34.8  . Smokeless tobacco: Never Used  . Tobacco comment: Stopped Smoking ~ 20 years ago  Substance Use Topics  . Alcohol use: Yes    Alcohol/week: 3.0 standard drinks    Types: 3 Cans of beer per week    Comment: 04/08/18 3 beers 2-3 night a week.   . Drug use: No     Allergies   Bystolic [nebivolol hcl]; Lipitor [atorvastatin]; Lisinopril; Pravastatin; Red yeast rice [cholestin]; Sulfonamide derivatives; and Zocor [simvastatin]   Review of Systems Review of Systems  All other systems reviewed and are negative.    Physical Exam Updated Vital Signs BP (!) 147/75   Pulse 92   Temp 98.4 F (36.9 C) (Oral)   Resp 14   Ht 1.676 m (5\' 6" )   Wt 68.9 kg   SpO2 99%   BMI 24.52 kg/m   Physical Exam Vitals signs and nursing note reviewed.  Constitutional:      General: He is not in acute distress.    Appearance: He is well-developed.  HENT:     Head: Normocephalic and atraumatic.     Mouth/Throat:     Pharynx: No oropharyngeal exudate.  Eyes:     General: No scleral icterus.       Right eye: No discharge.        Left eye: No discharge.     Conjunctiva/sclera: Conjunctivae normal.     Pupils: Pupils are equal, round, and reactive to light.  Neck:     Musculoskeletal: Normal range of motion and neck supple.     Thyroid: No thyromegaly.     Vascular: No JVD.  Cardiovascular:     Rate and Rhythm: Normal rate and regular rhythm.     Heart sounds: Normal heart sounds. No murmur. No friction rub. No gallop.   Pulmonary:     Effort: Pulmonary effort is normal. No respiratory distress.     Breath sounds: Normal breath sounds. No wheezing or  rales.  Abdominal:     General: Bowel sounds are normal. There is no distension.     Palpations: Abdomen is soft. There is no mass.     Tenderness: There is no abdominal tenderness.  Musculoskeletal: Normal range of motion.        General: No tenderness.  Lymphadenopathy:     Cervical: No cervical  adenopathy.  Skin:    General: Skin is warm and dry.     Findings: No erythema or rash.  Neurological:     Mental Status: He is alert.     Coordination: Coordination normal.  Psychiatric:        Behavior: Behavior normal.      ED Treatments / Results  Labs (all labs ordered are listed, but only abnormal results are displayed) Labs Reviewed  COMPREHENSIVE METABOLIC PANEL - Abnormal; Notable for the following components:      Result Value   Glucose, Bld 117 (*)    Creatinine, Ser 1.67 (*)    Calcium 10.5 (*)    GFR calc non Af Amer 40 (*)    GFR calc Af Amer 47 (*)    All other components within normal limits  TRIGLYCERIDES - Abnormal; Notable for the following components:   Triglycerides 303 (*)    All other components within normal limits  CBC WITH DIFFERENTIAL/PLATELET    EKG None  Radiology Dg Chest Port 1 View  Result Date: 09/04/2018 CLINICAL DATA:  Cough and solid food dysphagia. EXAM: PORTABLE CHEST 1 VIEW COMPARISON:  05/24/2017 FINDINGS: The cardiomediastinal silhouette is unremarkable. There is no evidence of focal airspace disease, pulmonary edema, suspicious pulmonary nodule/mass, pleural effusion, or pneumothorax. No acute bony abnormalities are identified. IMPRESSION: No active disease. Electronically Signed   By: Margarette Canada M.D.   On: 09/04/2018 18:56    Procedures .Sedation Date/Time: 09/04/2018 9:59 PM Performed by: Noemi Chapel, MD Authorized by: Noemi Chapel, MD   Consent:    Consent obtained:  Verbal   Consent given by:  Patient   Risks discussed:  Allergic reaction, dysrhythmia, inadequate sedation, nausea, prolonged hypoxia resulting in organ  damage, prolonged sedation necessitating reversal, respiratory compromise necessitating ventilatory assistance and intubation and vomiting   Alternatives discussed:  Analgesia without sedation, anxiolysis and regional anesthesia Universal protocol:    Procedure explained and questions answered to patient or proxy's satisfaction: yes     Relevant documents present and verified: yes     Test results available and properly labeled: yes     Imaging studies available: yes     Required blood products, implants, devices, and special equipment available: yes     Site/side marked: yes     Immediately prior to procedure a time out was called: yes     Patient identity confirmation method:  Verbally with patient Indications:    Procedure necessitating sedation performed by:  Different physician Pre-sedation assessment:    Time since last food or drink:  4 hours   ASA classification: class 2 - patient with mild systemic disease     Neck mobility: normal     Mouth opening:  3 or more finger widths   Mallampati score:  III - soft palate, base of uvula visible   Pre-sedation assessments completed and reviewed: airway patency, cardiovascular function, hydration status, mental status, nausea/vomiting, pain level, respiratory function and temperature     Pre-sedation assessment completed:  09/04/2018 8:45 PM Immediate pre-procedure details:    Reassessment: Patient reassessed immediately prior to procedure     Reviewed: vital signs, relevant labs/tests and NPO status     Verified: bag valve mask available, emergency equipment available, intubation equipment available, IV patency confirmed, oxygen available and suction available   Procedure details (see MAR for exact dosages):    Preoxygenation:  Nonrebreather mask   Sedation:  Propofol and midazolam   Analgesia:  Fentanyl   Intra-procedure monitoring:  Blood pressure monitoring, cardiac monitor, continuous pulse oximetry, frequent LOC assessments, frequent  vital sign checks and continuous capnometry   Intra-procedure events: none     Total Provider sedation time (minutes):  30 Post-procedure details:    Post-sedation assessment completed:  09/04/2018 10:01 PM   Attendance: Constant attendance by certified staff until patient recovered     Recovery: Patient returned to pre-procedure baseline     Post-sedation assessments completed and reviewed: airway patency, cardiovascular function, hydration status, mental status, nausea/vomiting, pain level, respiratory function and temperature     Patient is stable for discharge or admission: yes     Patient tolerance:  Tolerated well, no immediate complications   (including critical care time)  Medications Ordered in ED Medications  propofol (DIPRIVAN) 10 mg/mL bolus/IV push (has no administration in time range)  propofol (DIPRIVAN) 1000 MG/100ML infusion ( Intravenous MAR Unhold 09/04/18 2156)  fentaNYL (SUBLIMAZE) injection 25 mcg ( Intravenous MAR Unhold 09/04/18 2156)  fentaNYL (SUBLIMAZE) injection 25-100 mcg ( Intravenous MAR Unhold 09/04/18 2156)  etomidate (AMIDATE) injection (20 mg Intravenous Given 09/04/18 2108)  succinylcholine (ANECTINE) injection (150 mg Intravenous Given 09/04/18 2109)  propofol (DIPRIVAN) 1000 MG/100ML infusion (0 mcg/kg/min  68.9 kg Intravenous Stopped 09/04/18 2142)  0.9 %  sodium chloride infusion ( Intravenous Rate/Dose Change 09/04/18 2131)  fentaNYL (SUBLIMAZE) injection (100 mcg Intravenous Given 09/04/18 2115)  midazolam (VERSED) 5 MG/5ML injection (2 mg Intravenous Given 09/04/18 2121)  propofol (DIPRIVAN) 10 mg/mL bolus/IV push (50 mg Intravenous Given 09/04/18 2123)     Initial Impression / Assessment and Plan / ED Course  I have reviewed the triage vital signs and the nursing notes.  Pertinent labs & imaging results that were available during my care of the patient were reviewed by me and considered in my medical decision making (see chart for details).   Clinical Course as of Sep 04 2322  Sun Sep 04, 2018  1913 Discussed the case with Dr. Benson Norway at 7:13 PM, he will gather the staff to come in and do an endoscopy.  The patient is still not tolerating secretions   I have personally looked at his chest x-ray, my interpretation is that there is no pneumothorax or abnormal mediastinum, no obvious food bolus or radiopaque foreign body.   [BM]    Clinical Course User Index [BM] Noemi Chapel, MD       The patient wants to trial vomiting up the food.  Otherwise may need to try to pass an NG tube or call GI.  The pt needed to be intubated to do the procedure - GI asked me to do elective intubation to facilitate the endoscopy due to hx of radiation of the throat 6 years ago - see Resident note for intubation.  I performed the sedation while Dr. Benson Norway did the endoscopy - please see separate note -   I was present and assisted with the intubation as documented by the Resident.    Procedure successful - pt will be d/c when awake and alert and back to baseline.  Pt has now tolerated fluids and ambulated - is baseline and stable for d/c.  Final Clinical Impressions(s) / ED Diagnoses   Final diagnoses:  Food impaction of esophagus, initial encounter    ED Discharge Orders         Ordered    omeprazole (PRILOSEC) 20 MG capsule  2 times daily before meals     09/04/18 2312  Noemi Chapel, MD 09/04/18 [phone]

## 2018-09-04 NOTE — ED Notes (Signed)
Re-paged GI 

## 2018-09-04 NOTE — Op Note (Addendum)
[facility] Patient Name: Matthew Moreno Procedure Date : 09/04/2018 MRN: [id number] Attending MD: Carol Ada , MD Date of Birth: October 29, 1945 CSN: [id number] Age: 73 Admit Type: Emergency Department Procedure:                Upper GI endoscopy Indications:              Dysphagia Providers:                Carol Ada, MD, Elmer Ramp. Tilden Dome, RN, Laverda Sorenson, Technician Referring MD:              Medicines:                General Anesthesia Complications:            No immediate complications. Estimated Blood Loss:     Estimated blood loss: none. Procedure:                Pre-Anesthesia Assessment:                           - Prior to the procedure, a History and Physical                            was performed, and patient medications and                            allergies were reviewed. The patient's tolerance of                            previous anesthesia was also reviewed. The risks                            and benefits of the procedure and the sedation                            options and risks were discussed with the patient.                            All questions were answered, and informed consent                            was obtained. Prior Anticoagulants: The patient has                            taken no previous anticoagulant or antiplatelet                            agents. ASA Grade Assessment: III - A patient with                            severe systemic disease. After reviewing the risks  and benefits, the patient was deemed in                            satisfactory condition to undergo the procedure.                           - Sedation was administered by ER. General                            anesthesia was attained.                           After obtaining informed consent, the endoscope was                            passed under direct vision. Throughout the            procedure, the patient's blood pressure, pulse, and                            oxygen saturations were monitored continuously. The                            [id number]-[id number] ([id number]) Olympus gastroscope was                            introduced through the mouth, and advanced to the                            second part of duodenum. The upper GI endoscopy was                            accomplished without difficulty. The patient                            tolerated the procedure well. Scope In: Scope Out: Findings:      Food was found in the lower third of the esophagus.      LA Grade D (one or more mucosal breaks involving at least 75% of       esophageal circumference) esophagitis with bleeding was found in the       lower third of the esophagus.      One benign-appearing, intrinsic moderate stenosis was found in the lower       third of the esophagus. This stenosis measured 1 cm (in length). The       stenosis was traversed.      The stomach was normal.      The examined duodenum was normal.      A large meat bolus with found in the distal esophagus. With moderate       forward pressure the bolus was pushed into the gastric lumen.       Examination of the distal esophagus showed and esophagitis with       stenosis. The area was friable. There was no evidence of any       perforation. Crepitus was not palpated. The fundus was not able to be  cleared as there was a significant amount of vegetable matter, but a       large majority of the retained liquid food contents was suctioned out of       the stomach. This was to reduce the risk for aspiration. There was no       evidence of any radiation changes in the esophagus. Impression:               - Food in the lower third of the esophagus.                           - LA Grade D reflux esophagitis.                           - Benign-appearing esophageal stenosis.                           - Normal stomach.                            - Normal examined duodenum.                           - No specimens collected. Recommendation:           - Return patient to ER.                           - Resume regular diet.                           - Continue present medications.                           - Use a proton pump inhibitor PO BID for 1 month                            and then QD indefinitely.                           - Follow up with Dr. Ardis Hughs in 2-4 weeks. Procedure Code(s):        --- Professional ---                           [phone], Esophagogastroduodenoscopy, flexible,                            transoral; diagnostic, including collection of                            specimen(s) by brushing or washing, when performed                            (separate procedure) Diagnosis Code(s):        --- Professional ---                           B28.413K, Food in esophagus causing other injury,  initial encounter                           K21.0, Gastro-esophageal reflux disease with                            esophagitis                           K22.2, Esophageal obstruction                           R13.10, Dysphagia, unspecified CPT copyright 2019 [employer]. All rights reserved. The codes documented in this report are preliminary and upon coder review may  be revised to meet current compliance requirements. Carol Ada, MD Carol Ada, MD 09/04/2018 9:53:21 PM This report has been signed electronically. Number of Addenda: 0

## 2018-09-04 NOTE — ED Triage Notes (Signed)
Pt reports beef stuck in esophagus x 1 hour.  Handling secretions.  States he tried drinking water and was unsuccessful.  History of same per pt.

## 2018-09-04 NOTE — Consult Note (Signed)
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  He was eating [place] stew at 4 PM and he thought the chewed the food well.  The meat bolus lodged in this esophagus and he tried to vomit the bolus, but he was unsuccessful.  In the past he was able to bring up the food bolus and relieve his situation.  In the latter part of 2010 the patient underwent an EGD with Dr. Ardis Hughs for dyspepsia complaints.  Biopsies of the stomach only showed a chronic gastritis, but there was no evidence of H. Pylori.  In the ER an attempt to push the meat bolus through the esophagus with a NG tube failed.  The tube passed easily, per Dr. Sabra Heck, but the patient did not feel any relief and he was not able to manage his secretions.  The patient reports intermittent issues with dysphagia for the past 5-6 years and he believes his symptoms predated his XRT for his left tonsillar cancer.  Past Medical History:  Diagnosis Date  . CAD (coronary artery disease) 2008  . Dyslipidemia   . Family history of heart disease   . Hypertension   . Nephrolithiasis    H/O multiple "kidney stones", Lithotripsies, J-Stents  . Panic attacks   . S/P radiation therapy 02/12/2014-04/02/2014   Left tonsil and bilateral neck / 70 Gy in 35 fractions to gross disease, 63 Gy in 35 fractions to high risk nodalechelons, and 56 Gy in 35 fractions to intermediate risk nodal echelons   . Statin intolerance   . Tonsil cancer ([place]) 01/05/14   left    Past Surgical History:  Procedure Laterality Date  . BICEPS TENDON REPAIR Left   . CARDIAC CATHETERIZATION  06/19/2008   normal left main, Cfx with 3 OMs (50% osital narrowing in OM1), LAD densely calcified in the proximal half of vessel with eccentric narrowing in mid region  (40-50% severity), diag 1 & 2 free of disease, RCA with less than 30% narrowing in midportion (Dr. Loni Muse. [facility])  . COLONOSCOPY  2010   Dr. Ardis Hughs  . EYE SURGERY Bilateral    Cataract    Family History  Problem Relation Age of Onset  . Heart Problems Mother   . Heart Problems Father     Social History:  reports that he quit smoking about 34 years ago. His smoking use included cigarettes. He has a 15.00 pack-year smoking history. He has never used smokeless tobacco. He reports current alcohol use of about 3.0 standard drinks of alcohol per week. He reports that he does not use drugs.  Allergies:  Allergies  Allergen Reactions  . Bystolic [Nebivolol Hcl]   . Lipitor [Atorvastatin] Other (See Comments)    Muscle aches with statins  . Lisinopril   . Pravastatin   . Red Yeast Rice [Cholestin]   . Sulfonamide Derivatives     REACTION: hives  . Zocor [Simvastatin]     Medications: Scheduled: Continuous:  Results for orders placed or performed during the hospital encounter of 09/04/18 (from the past 24 hour(s))  CBC with Differential/Platelet     Status: None   Collection Time: 09/04/18  7:06 PM  Result Value Ref Range   WBC 8.4 4.0 - 10.5 K/uL   RBC 4.86 4.22 -  5.81 MIL/uL   Hemoglobin 15.4 13.0 - 17.0 g/dL   HCT 44.3 39.0 - 52.0 %   MCV 91.2 80.0 - 100.0 fL   MCH 31.7 26.0 - 34.0 pg   MCHC 34.8 30.0 - 36.0 g/dL   RDW 12.7 11.5 - 15.5 %   Platelets 354 150 - 400 K/uL   nRBC 0.0 0.0 - 0.2 %   Neutrophils Relative % 81 %   Neutro Abs 6.9 1.7 - 7.7 K/uL   Lymphocytes Relative 9 %   Lymphs Abs 0.8 0.7 - 4.0 K/uL   Monocytes Relative 8 %   Monocytes Absolute 0.7 0.1 - 1.0 K/uL   Eosinophils Relative 0 %   Eosinophils Absolute 0.0 0.0 - 0.5 K/uL   Basophils Relative 1 %   Basophils Absolute 0.1 0.0 - 0.1 K/uL   Immature Granulocytes 1 %   Abs Immature Granulocytes 0.04 0.00 - 0.07 K/uL  Comprehensive metabolic panel     Status: Abnormal   Collection Time: 09/04/18  7:06 PM   Result Value Ref Range   Sodium 142 135 - 145 mmol/L   Potassium 4.1 3.5 - 5.1 mmol/L   Chloride 104 98 - 111 mmol/L   CO2 27 22 - 32 mmol/L   Glucose, Bld 117 (H) 70 - 99 mg/dL   BUN 13 8 - 23 mg/dL   Creatinine, Ser 1.67 (H) 0.61 - 1.24 mg/dL   Calcium 10.5 (H) 8.9 - 10.3 mg/dL   Total Protein 7.5 6.5 - 8.1 g/dL   Albumin 4.2 3.5 - 5.0 g/dL   AST 31 15 - 41 U/L   ALT 26 0 - 44 U/L   Alkaline Phosphatase 57 38 - 126 U/L   Total Bilirubin 0.6 0.3 - 1.2 mg/dL   GFR calc non Af Amer 40 (L) >60 mL/min   GFR calc Af Amer 47 (L) >60 mL/min   Anion gap 11 5 - 15     Dg Chest Port 1 View  Result Date: 09/04/2018 CLINICAL DATA:  Cough and solid food dysphagia. EXAM: PORTABLE CHEST 1 VIEW COMPARISON:  05/24/2017 FINDINGS: The cardiomediastinal silhouette is unremarkable. There is no evidence of focal airspace disease, pulmonary edema, suspicious pulmonary nodule/mass, pleural effusion, or pneumothorax. No acute bony abnormalities are identified. IMPRESSION: No active disease. Electronically Signed   By: Margarette Canada M.D.   On: 09/04/2018 18:56    ROS:  As stated above in the HPI otherwise negative.  Blood pressure (!) 181/83, pulse 95, temperature 98.6 F (37 C), temperature source Oral, resp. rate 18, height 5\' 6"  (1.676 m), weight 68.9 kg, SpO2 93 %.    PE: Gen: NAD, Alert and Oriented HEENT:  /AT, EOMI Neck: Supple, no LAD Lungs: CTA Bilaterally CV: RRR without M/G/R ABM: Soft, NTND, +BS Ext: No C/C/E  Assessment/Plan: 1) Food impaction. 2) S/p XRT to the neck.   The patient will undergo an EGD, but he requires intubation.  Dr. Sabra Heck has kindly offered to assist with intubating the patient as anesthesia is not immediately available for the case.  This will allow for maximal airway protection in a patient with a presumed difficult airway.  Plan: 1) EGD now.  Eunique Balik D 09/04/2018, 8:42 PM

## 2018-09-04 NOTE — Discharge Instructions (Signed)
Call dr. Ardis Hughs for follow up this week Chew your food well Omeprazole twice daily until you see your doctor ER for worsening symptoms.

## 2018-09-05 ENCOUNTER — Telehealth: Payer: Self-pay

## 2018-09-05 ENCOUNTER — Encounter (HOSPITAL_COMMUNITY): Payer: Self-pay | Admitting: Gastroenterology

## 2018-09-05 NOTE — Telephone Encounter (Signed)
-----   Message from Milus Banister, MD sent at 09/05/2018  6:29 AM EDT ----- Emeline Darling, He needs NGI visit with me, first available virtual visit to discuss how he is feeling since starting PPI BID (remind him about that) and to plan for EGD with dilation as outpatient.  Thanks ----- Message ----- From: Carol Ada, MD Sent: 09/04/2018   9:40 PM EDT To: Milus Banister, MD  Linna Hoff,   I just disimpacted Mr. Caltagirone for a food impaction.  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  Please arrange for follow up.  Thanks!  Saralyn Pilar

## 2018-09-05 NOTE — Telephone Encounter (Signed)
Patient scheduled for new patient appt on 09/07/18

## 2018-09-07 ENCOUNTER — Other Ambulatory Visit: Payer: Self-pay

## 2018-09-07 ENCOUNTER — Encounter: Payer: Self-pay | Admitting: Gastroenterology

## 2018-09-07 ENCOUNTER — Ambulatory Visit: Payer: Medicare Other | Admitting: Gastroenterology

## 2018-09-07 VITALS — Ht 67.0 in | Wt 154.0 lb

## 2018-09-07 DIAGNOSIS — K219 Gastro-esophageal reflux disease without esophagitis: Secondary | ICD-10-CM

## 2018-09-07 DIAGNOSIS — K210 Gastro-esophageal reflux disease with esophagitis: Secondary | ICD-10-CM

## 2018-09-07 DIAGNOSIS — R1310 Dysphagia, unspecified: Secondary | ICD-10-CM

## 2018-09-07 DIAGNOSIS — K222 Esophageal obstruction: Secondary | ICD-10-CM

## 2018-09-07 MED ORDER — OMEPRAZOLE 40 MG PO CPDR: 40.0000 mg | DELAYED_RELEASE_CAPSULE | Freq: Two times a day (BID) | ORAL | 3 refills | Status: DC

## 2018-09-07 NOTE — Patient Instructions (Signed)
He is going to stop taking his 20 mg Prilosec pills   We will call in omeprazole 40 mg pills 1 pill twice daily shortly before breakfast and dinner meals, dispense 60 with 3 refills.   EGD in the [facility] for chronic GERD, dysphasia early to mid June.

## 2018-09-07 NOTE — Progress Notes (Signed)
Review of gastrointestinal problems: 1. Functional dyspepsia.  Belching, bloating, chest discomfort around stressful situations, 2010.  November, 2010 EGD found mild gastritis, biopsies negative for H. pylori. Proton pump inhibitor trial did not help his symptoms. Gastric emptying scan 2010 was normal. Abdominal ultrasound and HIDA scan, per patient report were also normal in 2010. Complete metabolic profile, celiac sprue testing 2010 were normal. 2. Routine risk for colon cancer: colonoscopy 12/2008 found hemorrhoids but no polyps or cancers. I recommended repeat in 10 years    This service was provided via virtual visit.   Only audio was used.  The patient was located at home.  I was located in my office.  The patient did consent to this virtual visit and is aware of possible charges through their insurance for this visit.  I have not seen him in 8 to 10 years and so he is considered a new patient.  My certified medical assistant, Grace Bushy, contributed to this visit by contacting the patient by phone 1 or 2 business days prior to the appointment and also followed up on the recommendations I made after the visit.  Time spent on virtual visit: 28 minutes   HPI: This is a very pleasant 73 year old man whom I last saw here in the office 8 or 10 years ago.   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 Lucia Gaskins was in 09/2016. Last visit with radiation onc 03/2018 (Dr. Isidore Moos) found no evidence of recurrence.  He recently had a an esophageal food impaction presented to the emergency room.  Dr. Carol Ada performed an EGD September 04, 2018 to remove the food impaction.  He noted severe LA grade D reflux related esophagitis as well as a focal stricture that he thought was benign.  He instructed the patient to be on proton pump inhibitor twice daily for 1 month and then daily indefinitely thereafter.  He has a lot of  heartburn chronically.  Mild intermittent dysphagia to solids chronically, at least since since his tonsilar cancer intermittent.  GERD with pyrosis, but no acid taste in his mouth.  Takes tums, 6 tums per day.  He was taking 'Care 1 acid relief"  That contains ranitidine 150mg  about 2 days per week.  6 months ago, took one pill once daily for 14 days, 20mg .  Since EGD 3-4 days ago, he has been nervous about eating solid foods,  Only eating soft foods, ensure.  He has been taking prilosec 40mg  pill BID before meals  His heartburn is better since bid PPI  He's gained 4-5 pounds  Drinks 4 cups of coffee in AM, rarely sodas or teas.  Drinks pretty rarely.  Chief complaint is been Matthew Moreno this is Dr. Oretha Caprice however you  ROS: complete GI ROS as described in HPI, all other review negative.  Constitutional:  No unintentional weight loss   Past Medical History:  Diagnosis Date  . CAD (coronary artery disease) 2008  . Dyslipidemia   . Dysphagia   . Family history of heart disease   . Hypertension   . Nephrolithiasis    H/O multiple "kidney stones", Lithotripsies, J-Stents  . Panic attacks   . S/P radiation therapy 02/12/2014-04/02/2014   Left tonsil and bilateral neck / 70 Gy in 35 fractions to gross disease, 63 Gy in 35 fractions to high risk nodalechelons, and 56 Gy in 35 fractions to intermediate risk nodal echelons   . Statin intolerance   .  Tonsil cancer ([place]) 01/05/14   left    Past Surgical History:  Procedure Laterality Date  . BICEPS TENDON REPAIR Left   . CARDIAC CATHETERIZATION  06/19/2008   normal left main, Cfx with 3 OMs (50% osital narrowing in OM1), LAD densely calcified in the proximal half of vessel with eccentric narrowing in mid region (40-50% severity), diag 1 & 2 free of disease, RCA with less than 30% narrowing in midportion (Dr. Loni Muse. Little)  . COLONOSCOPY  2010   Dr. Ardis Moreno  . ESOPHAGOGASTRODUODENOSCOPY N/A 09/04/2018   Procedure: ESOPHAGOGASTRODUODENOSCOPY  (EGD);  Surgeon: Carol Ada, MD;  Location: [facility];  Service: Endoscopy;  Laterality: N/A;  . EYE SURGERY Bilateral    Cataract  . FOREIGN BODY REMOVAL  09/04/2018   Procedure: FOREIGN BODY REMOVAL;  Surgeon: Carol Ada, MD;  Location: [facility];  Service: Endoscopy;;    Current Outpatient Medications  Medication Sig Dispense Refill  . ALPRAZolam (XANAX) 0.5 MG tablet Take 1 tablet (0.5 mg total) by mouth at bedtime as needed. 30 tablet 1  . aspirin 81 MG tablet Take 81 mg by mouth daily.    . calcium carbonate (TUMS EX) 750 MG chewable tablet Chew 1 tablet by mouth daily.    . fenofibrate 160 MG tablet TAKE 1 TABLET(160 MG) BY MOUTH DAILY 90 tablet 3  . metoprolol tartrate (LOPRESSOR) 25 MG tablet TAKE 1/2 TABLET (12.5 MG) BY MOUTH TWICE DAILY 90 tablet 3  . omeprazole (PRILOSEC) 20 MG capsule Take 1 capsule (20 mg total) by mouth 2 (two) times daily before a meal for 30 days. 60 capsule 1  . Pitavastatin Calcium (LIVALO) 4 MG TABS Take 1 tablet (4 mg total) by mouth daily. 7 tablet 3  . sodium fluoride (FLUORISHIELD) 1.1 % GEL dental gel Instill 1 drop into each tooth space of fluoride tray. Place over teeth for 5 minutes. Remove. Spit out excess. Repeat nightly. 120 mL prn   No current facility-administered medications for this visit.    Facility-Administered Medications Ordered in Other Visits  Medication Dose Route Frequency Provider Last Rate Last Dose  . influenza  inactive virus vaccine (FLUZONE/FLUARIX) injection 0.5 mL  0.5 mL Intramuscular Once Denita Lung, MD        Allergies as of 09/07/2018 - Review Complete 09/07/2018  Allergen Reaction Noted  . Bystolic [nebivolol hcl]  11/01/2013  . Lipitor [atorvastatin] Other (See Comments) 11/01/2013  . Lisinopril  11/01/2013  . Pravastatin  11/01/2013  . Red yeast rice [cholestin]  11/01/2013  . Sulfonamide derivatives  12/07/2008  . Zocor [simvastatin]  11/01/2013    Family History  Problem Relation Age of  Onset  . Heart Problems Mother   . Heart Problems Father     Social History   Socioeconomic History  . Marital status: Widowed    Spouse name: Not on file  . Number of children: 2  . Years of education: Not on file  . Highest education level: Not on file  Occupational History  . 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 Financial resource strain: Not on file  . Food insecurity:    Worry: Not on file    Inability: Not on file  . Transportation needs:    Medical: No    Non-medical: No  Tobacco Use  . Smoking status: Former Smoker    Packs/day: 0.75    Years: 20.00    Pack years: 15.00  Types: Cigarettes    Last attempt to quit: 11/02/1983    Years since quitting: 34.8  . Smokeless tobacco: Never Used  . Tobacco comment: Stopped Smoking ~ 20 years ago  Substance and Sexual Activity  . Alcohol use: Yes    Alcohol/week: 3.0 standard drinks    Types: 3 Cans of beer per week    Comment: 04/08/18 3 beers 2-3 night a week.   . Drug use: No  . Sexual activity: Not Currently  Lifestyle  . Physical activity:    Days per week: Not on file    Minutes per session: Not on file  . Stress: Not on file  Relationships  . Social connections:    Talks on phone: Not on file    Gets together: Not on file    Attends religious service: Not on file    Active member of club or organization: Not on file    Attends meetings of clubs or organizations: Not on file    Relationship status: Not on file  . Intimate partner violence:    Fear of current or ex partner: No    Emotionally abused: No    Physically abused: No    Forced sexual activity: No  Other Topics Concern  . Not on file  Social History Narrative   Patient is widowed. He had one son and one daughter. The son passed away from Tylenol toxicity.     Physical Exam: Unable to perform because this was a "telemed visit" due to current Covid-19 pandemic  Assessment and plan: 73 y.o.  male with chronic GERD, recent esophageal food impaction  He has chronic GERD, severe esophagitis during recent EGD at the time of an esophageal food impaction.  Since starting proton pump inhibitor twice daily he is feeling better from a GERD perspective he is being very careful eating and chewing well.  He is currently only on 20 mg Prilosec twice daily and so we will call in a prescription strength proton pump inhibitor that he will be taking twice daily shortly before meals.  This should help heal the esophagitis, probably a lot of the edema associated with the esophagitis, may open up his stricture as well.  I will plan on EGD in 6 to 7 weeks from now to allow further time for the antiacid medicines to work, at that time we will plan to dilate if he has fixed stricture still.  Please see the "Patient Instructions" section for addition details about the plan.  Owens Loffler, MD [facility] 09/07/2018, 10:33 AM

## 2018-09-08 ENCOUNTER — Ambulatory Visit: Payer: Medicare Other | Admitting: Family Medicine

## 2018-09-08 ENCOUNTER — Encounter (HOSPITAL_COMMUNITY): Payer: Self-pay | Admitting: Anesthesiology

## 2018-09-16 ENCOUNTER — Telehealth: Payer: Self-pay

## 2018-09-16 MED ORDER — PITAVASTATIN CALCIUM 4 MG PO TABS: 1.0000 | ORAL_TABLET | Freq: Every day | ORAL | 4 refills | Status: DC

## 2018-09-16 NOTE — Telephone Encounter (Signed)
Patient called to get samples of Livalo.

## 2018-09-20 ENCOUNTER — Encounter (HOSPITAL_COMMUNITY): Payer: Self-pay | Admitting: Anesthesiology

## 2018-09-21 ENCOUNTER — Encounter (HOSPITAL_COMMUNITY): Payer: Self-pay | Admitting: Anesthesiology

## 2018-10-03 ENCOUNTER — Telehealth: Payer: Self-pay | Admitting: Gastroenterology

## 2018-10-03 NOTE — Telephone Encounter (Signed)
Pt called and stated the the medicaiton omeprazole (PRILOSEC) 40 MG capsule [[id number]]  Started to make him short of breath he is wanting to speak with the nurse for advice

## 2018-10-03 NOTE — Telephone Encounter (Signed)
Started omeprazole about a month ago.  SOB began after 3 weeks of taking.  Never had any SOB until starting omeprazole.  Stopped it on Saturday and the SOB resolved.  Took one on Sunday because of GERD and had no problems.  Has been on 40 mg BID. He was advised to stop taking and I will forward to Dr Ardis Hughs for further recommendations.  Please advise.

## 2018-10-05 NOTE — Telephone Encounter (Signed)
The patient has been notified of this information and all questions answered. The pt has been advised of the information and verbalized understanding.    

## 2018-10-05 NOTE — Telephone Encounter (Signed)
Shortness of breath would be a very unusual side effect of omeprazole, in fact is not mentioned at all in my eppocrates drug index as even a potential reported side effect.   If he feels better on once daily omeprazole then I think he should at least continue to take that.

## 2018-10-12 ENCOUNTER — Other Ambulatory Visit: Payer: Self-pay | Admitting: Gastroenterology

## 2018-10-12 ENCOUNTER — Telehealth: Payer: Self-pay

## 2018-10-12 NOTE — Telephone Encounter (Signed)
I notified patient to remind him that he was due in June foe an upper endoscopy. At the time of the phone call he was ready to schedule the appointment until he realized he does not have a care partner.  He will t contact some friends and find out if they can help him with transportation. A letter has been mailed out to patient as a reminder and he will contact the office once he coordinates transportation.

## 2018-10-17 ENCOUNTER — Telehealth: Payer: Self-pay | Admitting: Family Medicine

## 2018-10-17 NOTE — Telephone Encounter (Signed)
Pt called requesting samples of Livalo 4 mg  Please call pt

## 2018-10-17 NOTE — Telephone Encounter (Signed)
Done KH 

## 2018-10-17 NOTE — Telephone Encounter (Signed)
Give him a few days to have some

## 2018-11-04 ENCOUNTER — Telehealth: Payer: Self-pay

## 2018-11-04 MED ORDER — LIVALO 4 MG PO TABS: 1.0000 | ORAL_TABLET | Freq: Every day | ORAL | 4 refills | Status: DC

## 2018-11-04 NOTE — Telephone Encounter (Signed)
Pt came for samples of Livalo.

## 2018-12-12 ENCOUNTER — Encounter: Payer: Self-pay | Admitting: Gastroenterology

## 2018-12-30 ENCOUNTER — Other Ambulatory Visit: Payer: Self-pay | Admitting: Gastroenterology

## 2019-01-20 ENCOUNTER — Encounter: Payer: Self-pay | Admitting: Internal Medicine

## 2019-01-24 ENCOUNTER — Ambulatory Visit (INDEPENDENT_AMBULATORY_CARE_PROVIDER_SITE_OTHER): Payer: Medicare Other | Admitting: Internal Medicine

## 2019-01-24 ENCOUNTER — Encounter: Payer: Self-pay | Admitting: Internal Medicine

## 2019-01-24 ENCOUNTER — Other Ambulatory Visit: Payer: Self-pay

## 2019-01-24 VITALS — BP 164/84 | HR 71 | Ht 66.0 in | Wt 160.8 lb

## 2019-01-24 DIAGNOSIS — I10 Essential (primary) hypertension: Secondary | ICD-10-CM | POA: Diagnosis not present

## 2019-01-24 DIAGNOSIS — I2510 Atherosclerotic heart disease of native coronary artery without angina pectoris: Secondary | ICD-10-CM

## 2019-01-24 DIAGNOSIS — E781 Pure hyperglyceridemia: Secondary | ICD-10-CM

## 2019-01-24 MED ORDER — METOPROLOL TARTRATE 25 MG PO TABS: 25.0000 mg | ORAL_TABLET | Freq: Two times a day (BID) | ORAL | 3 refills | Status: DC

## 2019-01-24 NOTE — Patient Instructions (Signed)
Medication Instructions:  INCREASE metoprolol tartrate to 25mg  twice daily  Continue other current medications  If you need a refill on your cardiac medications before your next appointment, please call your pharmacy.   Lab Work: FASTING blood work to check cholesterol   Follow-Up: At [facility], you and your health needs are our priority.  As part of our continuing mission to provide you with exceptional heart care, we have created designated Provider Care Teams.  These Care Teams include your primary Cardiologist (physician) and Advanced Practice Providers (APPs -  Physician Assistants and Nurse Practitioners) who all work together to provide you with the care you need, when you need it. You will need a follow up appointment in 4-6 weeks with APP on Dr. Lysbeth Penner care team.  Advanced Practice Providers on your designated Care Team:  Almyra Deforest, PA-C  Fabian Sharp, PA-C  Any Other Special Instructions Will Be Listed Below (If Applicable).

## 2019-01-24 NOTE — Progress Notes (Signed)
OFFICE NOTE  Chief Complaint:  No complaints  Primary Care Physician: Denita Lung, MD  HPI:  Matthew Moreno is a 73 year old gentleman we are following for history of coronary disease, moderate, in 2010 on catheterization, 50% LAD, 40% RCA and 50% OM lesion. He also has hypertension difficult to control, dyslipidemia and intolerance to statins. He has returned today with worsening leg pain which now he is attributing to fenofibrate. His most recent lipid profile obtained on April 11, 2012, showed total cholesterol 241, triglycerides 217, LDL was 158, HDL 40. His triglycerides are actually down from about 400 last year with the fenofibrate so that seems to be working. Unfortunately, he was directed to take Livalo 2 mg daily last year at his last visit but did not start that and is now contemplating discontinuing the fenofibrate as he is concerned this is causing his muscle pain. Since his last visit, he has stopped both the little and fenofibrate. Prior to his last blood work, he went out with some friends and was drinking bourbon in his triglycerides were 1400. Therefore difficult to know what his LDL actually is without measuring a direct LDL. We discussed options as alternatives to statins including WelChol and his idea, however he is not interested in any further medications.  [name] has no new complaints today. 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 He is undergone radiation therapy and is apparently clear of this. Unfortunately he was intolerant to the statins and cannot afford to take fenofibrate. He is currently not on any therapy for lipid lowering and does have moderate coronary artery disease. We have discussed this in the past and felt there were a few additional options. He does say that he lost 20 or 30 pounds recently due to his cancer and wonders if his cholesterol is improved.  01/10/2016  [name] returns today for follow-up. He seems to  be doing fairly well although recently he's had some worsening shortness of breath and fatigue. This is mostly with exertion. 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 Several days he's had to go back home and go to sleep for the rest of the day. He does not associate this with the hot weather. He denies any chest pain with this but has been more fatigued recently. He is concern is a friend of his had some similar symptoms and underwent a bypass surgery. [name] does have a history of moderate coronary artery disease by catheterization in 2010. He's had difficult to control cholesterol due to intolerance to statins but has been able to take Livalo. Recent lab work indicated much higher triglycerides been previously, in fact greater than 400 therefore LDL was not calculated, however LDL last year was 102.  01/11/2017  [name] is seen today in follow-up. Overall he is asymptomatic. He was having some shortness of breath but that improved. We did a stress test last year which was negative for ischemia. He's gained about 15 pounds since we last saw him, he feels that he's eating last however gaining weight. He denies any swelling. He's not had any significant muscle mass. He generally eats a large "country" breakfast, usually a sandwich for lunch and very little if anything for dinner. He's had elevated triglycerides in the past and an LDL over 100. I started him on Livalo which she is tolerating, but is not made a significant improvement in triglycerides rather his LDL was lower in the  60s. Fasting blood glucose is been in the low 100s in the past but he is not known to be diabetic.  01/12/2018  Mr. Marcus is seen today in follow-up.  Overall he continues to be asymptomatic.  He denies any chest pain or worsening shortness of breath.  Weight has been stable.  His last lipid profile 9 months ago by his PCP was not a goal showing total cholesterol 221, HDL 42,  triglycerides 345 and LDL 130.  Non-HDL cholesterol significantly elevated at 180 suggesting significant residual risk.  01/24/2019  Mr. Romel is seen today in follow-up.  He denies any chest pain or worsening shortness of breath.  Blood pressure was elevated today 164/84 and a recheck came down to 142/74.  He says at home his blood pressure ranges from the low 130s up to 150s consistently.  He is only on half tablet of the metoprolol.  He does not think that that is making a significant effect on his blood pressure.  Cholesterol has not been reassessed recently and he is overdue for that.  He did have triglycerides checked in April which were elevated and remain persistently elevated.  He is possibly a good candidate for Vascepa about which we discussed today.  PMHx:  Past Medical History:  Diagnosis Date  . CAD (coronary artery disease) 2008  . Dyslipidemia   . Dysphagia   . Family history of heart disease   . Hypertension   . Nephrolithiasis    H/O multiple "kidney stones", Lithotripsies, J-Stents  . Panic attacks   . S/P radiation therapy 02/12/2014-04/02/2014   Left tonsil and bilateral neck / 70 Gy in 35 fractions to gross disease, 63 Gy in 35 fractions to high risk nodalechelons, and 56 Gy in 35 fractions to intermediate risk nodal echelons   . Statin intolerance   . Tonsil cancer ([place]) 01/05/14   left    Past Surgical History:  Procedure Laterality Date  . BICEPS TENDON REPAIR Left   . CARDIAC CATHETERIZATION  06/19/2008   normal left main, Cfx with 3 OMs (50% osital narrowing in OM1), LAD densely calcified in the proximal half of vessel with eccentric narrowing in mid region (40-50% severity), diag 1 & 2 free of disease, RCA with less than 30% narrowing in midportion (Dr. Loni Muse. [place])  . COLONOSCOPY  2010   Dr. Ardis Hughs  . ESOPHAGOGASTRODUODENOSCOPY N/A 09/04/2018   Procedure: ESOPHAGOGASTRODUODENOSCOPY (EGD);  Surgeon: Carol Ada, MD;  Location: [place];  Service: Endoscopy;   Laterality: N/A;  . EYE SURGERY Bilateral    Cataract  . FOREIGN BODY REMOVAL  09/04/2018   Procedure: FOREIGN BODY REMOVAL;  Surgeon: Carol Ada, MD;  Location[facility]COPY;  Service: Endoscopy;;    FAMHx:  Family History  Problem Relation Age of Onset  . Heart Problems Mother   . Heart Problems Father     SOCHx:   reports that he quit smoking about 35 years ago. His smoking use included cigarettes. He has a 15.00 pack-year smoking history. He has never used smokeless tobacco. He reports current alcohol use of about 3.0 standard drinks of alcohol per week. He reports that he does not use drugs.  ALLERGIES:  Allergies  Allergen Reactions  . Bystolic [Nebivolol Hcl]   . Lipitor [Atorvastatin] Other (See Comments)    Muscle aches with statins  . Lisinopril   . Pravastatin   . Red Yeast Rice [Cholestin]   . Sulfonamide Derivatives     REACTION: hives  . Zocor [Simvastatin]  ROS: Pertinent items noted in HPI and remainder of comprehensive ROS otherwise negative.  HOME MEDS: Current Outpatient Medications  Medication Sig Dispense Refill  . omeprazole (PRILOSEC) 20 MG capsule Take 20 mg by mouth daily.    Marland Kitchen ALPRAZolam (XANAX) 0.5 MG tablet Take 1 tablet (0.5 mg total) by mouth at bedtime as needed. 30 tablet 1  . aspirin 81 MG tablet Take 81 mg by mouth daily.    . calcium carbonate (TUMS EX) 750 MG chewable tablet Chew 1 tablet by mouth daily.    . fenofibrate 160 MG tablet TAKE 1 TABLET(160 MG) BY MOUTH DAILY 90 tablet 3  . metoprolol tartrate (LOPRESSOR) 25 MG tablet TAKE 1/2 TABLET (12.5 MG) BY MOUTH TWICE DAILY 90 tablet 3  . Pitavastatin Calcium (LIVALO) 4 MG TABS Take 1 tablet (4 mg total) by mouth daily. 7 tablet 4  . sodium fluoride (FLUORISHIELD) 1.1 % GEL dental gel Instill 1 drop into each tooth space of fluoride tray. Place over teeth for 5 minutes. Remove. Spit out excess. Repeat nightly. 120 mL prn   No current facility-administered medications for this  visit.    Facility-Administered Medications Ordered in Other Visits  Medication Dose Route Frequency Provider Last Rate Last Dose  . influenza  inactive virus vaccine (FLUZONE/FLUARIX) injection 0.5 mL  0.5 mL Intramuscular Once Denita Lung, MD        LABS/IMAGING: No results found for this or any previous visit (from the past 48 hour(s)). No results found.  VITALS: BP (!) 164/84   Pulse 71   Ht 5\' 6"  (1.676 m)   Wt 160 lb 12.8 oz (72.9 kg)   BMI 25.95 kg/m   EXAM: General appearance: alert and no distress Neck: no carotid bruit, no JVD and thyroid not enlarged, symmetric, no tenderness/mass/nodules Lungs: clear to auscultation bilaterally Heart: regular rate and rhythm, S1, S2 normal, no murmur, click, rub or gallop Abdomen: soft, non-tender; bowel sounds normal; no masses,  no organomegaly Extremities: extremities normal, atraumatic, no cyanosis or edema Pulses: 2+ and symmetric Skin: Skin color, texture, turgor normal. No rashes or lesions Neurologic: Grossly normal Psych: Pleasant  EKG: Normal sinus rhythm at 71-personally reviewed  ASSESSMENT: 1. Moderate nonobstructive coronary artery disease (2010 cath) 2. Hypertension 3. Dyslipidemia - on Livalo 4. Recent tonsillar cancer 5. DOE - LVEF 60-65%, mild AI (no discernible murmur)  PLAN: 1.   Mr. Kapple continues to have persistently elevated blood pressure.  We will plan an increase in his Lopressor to 25 mg twice daily today.  He will need a repeat office visit for blood pressure check in about a month.  If his blood pressure remains elevated, then I would advise adding an additional agent such as an ARB or calcium channel blocker.  Finally, we will repeat a lipid profile.  I think is a good candidate for Vascepa given his persistently elevated triglycerides on top of statin therapy.  We will discuss that further.  Pixie Casino, MD, [facility], [place] Director of the Advanced  Lipid Disorders &  Cardiovascular Risk Reduction Clinic Diplomate of the American Board of Clinical Lipidology Attending Cardiologist  Direct Dial: [phone]  Fax: [phone]  Website:  [email or web address] 01/24/2019, 2:56 PM

## 2019-01-25 ENCOUNTER — Telehealth: Payer: Self-pay | Admitting: *Deleted

## 2019-01-25 ENCOUNTER — Other Ambulatory Visit (INDEPENDENT_AMBULATORY_CARE_PROVIDER_SITE_OTHER): Payer: Medicare Other

## 2019-01-25 DIAGNOSIS — Z23 Encounter for immunization: Secondary | ICD-10-CM | POA: Diagnosis not present

## 2019-01-25 NOTE — Telephone Encounter (Signed)
Set him up for virtual visit concerning his blood pressure and treatment.

## 2019-01-25 NOTE — Telephone Encounter (Signed)
Patient was here for bp check. Reading was 158/76.

## 2019-01-26 NOTE — Telephone Encounter (Signed)
appt Monday morning

## 2019-01-29 ENCOUNTER — Other Ambulatory Visit: Payer: Self-pay | Admitting: Gastroenterology

## 2019-01-30 ENCOUNTER — Other Ambulatory Visit: Payer: Self-pay

## 2019-01-30 ENCOUNTER — Ambulatory Visit (INDEPENDENT_AMBULATORY_CARE_PROVIDER_SITE_OTHER): Payer: Medicare Other | Admitting: Family Medicine

## 2019-01-30 ENCOUNTER — Encounter: Payer: Self-pay | Admitting: Family Medicine

## 2019-01-30 VITALS — BP 143/73 | HR 76 | Temp 96.8°F | Wt 160.0 lb

## 2019-01-30 DIAGNOSIS — I2510 Atherosclerotic heart disease of native coronary artery without angina pectoris: Secondary | ICD-10-CM | POA: Diagnosis not present

## 2019-01-30 DIAGNOSIS — E781 Pure hyperglyceridemia: Secondary | ICD-10-CM | POA: Diagnosis not present

## 2019-01-30 DIAGNOSIS — I10 Essential (primary) hypertension: Secondary | ICD-10-CM | POA: Diagnosis not present

## 2019-01-30 MED ORDER — ICOSAPENT ETHYL 1 G PO CAPS: 2.0000 | ORAL_CAPSULE | Freq: Two times a day (BID) | ORAL | 11 refills | Status: DC

## 2019-01-30 MED ORDER — AMLODIPINE BESYLATE 5 MG PO TABS: 5.0000 mg | ORAL_TABLET | Freq: Every day | ORAL | 3 refills | Status: DC

## 2019-01-30 NOTE — Progress Notes (Signed)
   Subjective:    Patient ID: Matthew Moreno, male    DOB: [birth year], 73 y.o.   MRN: [id number]  HPI Documentation for virtual telephone encounter.  Documentation for virtual audio and video telecommunications through [place] encounter: The patient was located at home. The provider was located in the office. The patient did consent to this visit and is aware of possible charges through their insurance for this visit. The other persons participating in this telemedicine service were none. Time spent on call was 5 minutes and in review of previous records >15 minutes total. This virtual service is not related to other E/M service within previous 7 days. He was seen recently by cardiology and noted to have high blood pressure.  He was to increase his metoprolol to 1 pill twice per day.  He did this month but had unacceptable side effects of making him feel weak and agitated and does not want to do this again.  Also the note indicated the need for adding Vascepa as his triglycerides are still elevated.   Review of Systems     Objective:   Physical Exam Alert and in no distress otherwise not examined       Assessment & Plan:  Essential hypertension - Plan: amLODipine (NORVASC) 5 MG tablet  Coronary artery disease involving native coronary artery of native heart without angina pectoris - Plan: Icosapent Ethyl 1 g CAPS  Hypertriglyceridemia - Plan: Icosapent Ethyl 1 g CAPS I will add amlodipine to his regimen.  Discussed proper technique and checking blood pressure which he says he is doing.  He has a new blood pressure machine and he is to bring this in in a month so we can compare with ours. I then discussed Vascepa with him.  Explained that it might be an expensive medication.  He will check on that.  We will discuss this again when he comes in.

## 2019-02-13 ENCOUNTER — Telehealth: Payer: Self-pay | Admitting: Family Medicine

## 2019-02-13 NOTE — Telephone Encounter (Signed)
Pt called and states that the [place] is not working states that every time he takes it  cause tightness in his chest, and his BP and Pulse goes up, state she drank a beer and his BP went to 178/80 and his pulse went to 103, and his BP is still running around 150/76, he is wanting to know if he can stop taking this medicine, pt can be reached at [phone]

## 2019-02-13 NOTE — Telephone Encounter (Signed)
Have him go ahead and stop the medication.  He is having an adverse reaction from it.

## 2019-02-13 NOTE — Telephone Encounter (Signed)
Pt was advised kh 

## 2019-03-02 ENCOUNTER — Encounter: Payer: Self-pay | Admitting: Family Medicine

## 2019-03-02 ENCOUNTER — Other Ambulatory Visit: Payer: Self-pay

## 2019-03-02 ENCOUNTER — Ambulatory Visit (INDEPENDENT_AMBULATORY_CARE_PROVIDER_SITE_OTHER): Payer: Medicare Other | Admitting: Family Medicine

## 2019-03-02 VITALS — BP 157/80 | HR 70 | Temp 96.2°F | Wt 161.6 lb

## 2019-03-02 DIAGNOSIS — I10 Essential (primary) hypertension: Secondary | ICD-10-CM

## 2019-03-02 NOTE — Progress Notes (Signed)
   Subjective:    Patient ID: Matthew Moreno, male    DOB: 08-May-1946, 73 y.o.   MRN: [id number]  HPI He is here for recheck on blood pressure.  He did bring his BP cuff in with him.  It was compared to our readings and he is 10 points higher on systolic and diastolic.  He is also noted on occasion when he is stooping over and then stands up he becomes slightly dizzy.     Review of Systems     Objective:   Physical Exam Alert and in no distress.  Blood pressure is recorded.       Assessment & Plan:  Essential hypertension Recommend that he reduce by 10 points both systolic and diastolic to get an accurate reading.  Also explained that what he is describing by going from stooping to standing is a postural issue is very common.  Recommend keeping himself hydrated and moving from one position to another take a little slower pace.

## 2019-03-07 DIAGNOSIS — E781 Pure hyperglyceridemia: Secondary | ICD-10-CM | POA: Diagnosis not present

## 2019-03-07 DIAGNOSIS — I2510 Atherosclerotic heart disease of native coronary artery without angina pectoris: Secondary | ICD-10-CM | POA: Diagnosis not present

## 2019-03-07 DIAGNOSIS — I10 Essential (primary) hypertension: Secondary | ICD-10-CM | POA: Diagnosis not present

## 2019-03-07 LAB — LIPID PANEL
Chol/HDL Ratio: 4.6 ratio (ref 0.0–5.0)
Cholesterol, Total: 172 mg/dL (ref 100–199)
HDL: 37 mg/dL — ABNORMAL LOW (ref >39)
LDL Chol Calc (NIH): 98 mg/dL (ref 0–99)
Triglycerides: 217 mg/dL — ABNORMAL HIGH (ref 0–149)
VLDL Cholesterol Cal: 37 mg/dL (ref 5–40)

## 2019-03-07 LAB — LDL CHOLESTEROL, DIRECT: LDL Direct: 100 mg/dL — ABNORMAL HIGH (ref 0–99)

## 2019-03-08 ENCOUNTER — Ambulatory Visit: Payer: Medicare Other | Admitting: Adult Health

## 2019-03-31 ENCOUNTER — Telehealth: Payer: Self-pay | Admitting: Family Medicine

## 2019-03-31 MED ORDER — LIVALO 4 MG PO TABS: 1.0000 | ORAL_TABLET | Freq: Every day | ORAL | 0 refills | Status: DC

## 2019-03-31 NOTE — Telephone Encounter (Signed)
Advised pt samples Livalo 4mg  given ok per Dr. Redmond School

## 2019-04-07 ENCOUNTER — Ambulatory Visit
Admission: RE | Admit: 2019-04-07 | Discharge: 2019-04-07 | Disposition: A | Discharge status: Home / Self Care | Payer: Medicare Other | Source: Ambulatory Visit | Attending: Radiation Oncology | Admitting: Radiation Oncology

## 2019-04-07 DIAGNOSIS — Z85818 Personal history of malignant neoplasm of other sites of lip, oral cavity, and pharynx: Secondary | ICD-10-CM | POA: Diagnosis not present

## 2019-04-07 DIAGNOSIS — C090 Malignant neoplasm of tonsillar fossa: Secondary | ICD-10-CM

## 2019-04-07 DIAGNOSIS — Z08 Encounter for follow-up examination after completed treatment for malignant neoplasm: Secondary | ICD-10-CM | POA: Diagnosis not present

## 2019-04-07 NOTE — Progress Notes (Addendum)
Radiation Oncology         [phone] ________________________________  Name: Matthew Moreno MRN: [id number]  Date: 04/07/2019  DOB: [birth year]  Follow-Up Visit Note by telephone as patient was unable to access MyChart video during pandemic precautions  CC: Denita Lung, MD  [name], *  Diagnosis and Prior Radiotherapy:    C09.0 Cancer of Tonsillar Fossa   ICD-10-CM   1. 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  Narrative:  The patient returns today for routine follow-up of radiation completed 04/02/2014 to his left tonsil.  Eating well, has gained a bit. 3lb since Nov 2019.  Wt Readings from Last 3 Encounters:  03/02/19 161 lb 9.6 oz (73.3 kg)  01/30/19 160 lb (72.6 kg)  01/24/19 160 lb 12.8 oz (72.9 kg)   He denies any new masses in his neck.  No new soreness in his throat.  No new hoarseness.  He is not smoking or chewing tobacco. He is using fluoride trays daily. He is seeing his dentist.  Swallowing -he had an issue of food impaction in his esophagus which required ED visit and endoscopy by gastroenterology.  This happened in April and has not happened since.  Modifying his diet has helped with this.  Dr. Lucia Gaskins - he does not see him any longer.   ALLERGIES:  is allergic to bystolic [nebivolol hcl]; lipitor [atorvastatin]; lisinopril; pravastatin; red yeast rice [cholestin]; sulfonamide derivatives; and zocor [simvastatin].  Meds: Current Outpatient Medications  Medication Sig Dispense Refill  . ALPRAZolam (XANAX) 0.5 MG tablet Take 1 tablet (0.5 mg total) by mouth at bedtime as needed. 30 tablet 1  . amLODipine (NORVASC) 5 MG tablet Take 1 tablet (5 mg total) by mouth daily. 90 tablet 3  . aspirin 81 MG tablet Take 81 mg by mouth daily.    . calcium carbonate (TUMS EX)  750 MG chewable tablet Chew 1 tablet by mouth daily.    . fenofibrate 160 MG tablet TAKE 1 TABLET(160 MG) BY MOUTH DAILY 90 tablet 3  . Icosapent Ethyl 1 g CAPS Take 2 capsules (2 g total) by mouth 2 (two) times daily. 120 capsule 11  . metoprolol tartrate (LOPRESSOR) 25 MG tablet Take 1 tablet (25 mg total) by mouth 2 (two) times daily. 180 tablet 3  . omeprazole (PRILOSEC) 20 MG capsule Take 20 mg by mouth daily.    . Pitavastatin Calcium (LIVALO) 4 MG TABS Take 1 tablet (4 mg total) by mouth daily. 28 tablet 0  . sodium fluoride (FLUORISHIELD) 1.1 % GEL dental gel Instill 1 drop into each tooth space of fluoride tray. Place over teeth for 5 minutes. Remove. Spit out excess. Repeat nightly. 120 mL prn   No current facility-administered medications for this encounter.    Facility-Administered Medications Ordered in Other Encounters  Medication Dose Route Frequency Provider Last Rate Last Dose  . influenza  inactive virus vaccine (FLUZONE/FLUARIX) injection 0.5 mL  0.5 mL Intramuscular Once Denita Lung, MD        Physical Findings: The patient is in no acute distress. Patient is alert and oriented.   vitals were not taken for this visit. .    Lab Findings: Lab Results  Component Value Date   WBC 8.4 09/04/2018   HGB 15.4 09/04/2018   HCT 44.3 09/04/2018  MCV 91.2 09/04/2018   PLT 354 09/04/2018    Lab Results  Component Value Date   TSH 2.705 04/02/2017    Radiographic Findings: No new imaging. No results found.  Impression/Plan:    1) Head and Neck Cancer Status: NED, No evidence of recurrence.  2) Nutritional Status: Good  3) Risk Factors: The patient has been educated about risk factors including tobacco abuse; they understand that avoidance of tobacco is important to prevent recurrences as well as other cancers.  4) Swallowing: He has had issues with swallowing that preceded radiation therapy.  He reports that more recently he had an episode of food getting  impacted in his throat which prompted him to go to the emergency room and undergo endoscopy by gastroenterology.  That was a solitary incident and it has not happened again for months after modifying his diet.  He will follow-up with gastroenterology as needed if problems become more persistent.  5) Dental: Encouraged to continue regular followup with dentistry, and dental hygiene including fluoride treatments.  6) Dr. Redmond School has been checking his thyroid function, with the most recent levels resulting normal 2 years ago.  Patient does not show any symptoms of hypothyroidism.  He will be following up in the near future with Dr. Redmond School and asked him to continue checking his TSH on an annual basis.  I also sent a note through the electronic medical record to Dr. Redmond School about this.  Lab Results  Component Value Date   TSH 2.705 04/02/2017    7) The patient has been cured of his cancer.  He is 5 years out from treatment.  I will see him back on a as needed basis.  He is pleased with this plan and knows to call if he has any concerns in the future.  This encounter was provided by telemedicine platform by telephone as patient was unable to access MyChart video during pandemic precautions The patient has given verbal consent for this type of encounter and has been advised to only accept a meeting of this type in a secure network environment. The time spent during this encounter was 10 minutes. The attendants for this meeting include Eppie Gibson  and [name].  During the encounter, Eppie Gibson was located at [facility] Radiation Oncology Department.  [name] was located at home.    _____________________________________   Eppie Gibson, MD

## 2019-04-10 ENCOUNTER — Encounter: Payer: Self-pay | Admitting: Radiation Oncology

## 2019-04-29 ENCOUNTER — Other Ambulatory Visit: Payer: Self-pay | Admitting: Family Medicine

## 2019-04-29 DIAGNOSIS — E781 Pure hyperglyceridemia: Secondary | ICD-10-CM

## 2019-05-25 ENCOUNTER — Ambulatory Visit: Payer: Medicare Other | Attending: Internal Medicine

## 2019-05-25 DIAGNOSIS — Z20822 Contact with and (suspected) exposure to covid-19: Secondary | ICD-10-CM | POA: Diagnosis not present

## 2019-05-26 ENCOUNTER — Ambulatory Visit: Payer: Medicare Other | Admitting: Family Medicine

## 2019-05-27 LAB — NOVEL CORONAVIRUS, NAA: SARS-CoV-2, NAA: NOT DETECTED

## 2019-07-13 ENCOUNTER — Encounter: Payer: Self-pay | Admitting: Family Medicine

## 2019-07-13 ENCOUNTER — Other Ambulatory Visit: Payer: Self-pay

## 2019-07-13 ENCOUNTER — Ambulatory Visit: Payer: Medicare Other | Admitting: Family Medicine

## 2019-07-13 VITALS — BP 140/82 | HR 69 | Temp 97.5°F | Ht 66.0 in | Wt 159.6 lb

## 2019-07-13 DIAGNOSIS — I2510 Atherosclerotic heart disease of native coronary artery without angina pectoris: Secondary | ICD-10-CM | POA: Diagnosis not present

## 2019-07-13 DIAGNOSIS — Z9841 Cataract extraction status, right eye: Secondary | ICD-10-CM | POA: Diagnosis not present

## 2019-07-13 DIAGNOSIS — K219 Gastro-esophageal reflux disease without esophagitis: Secondary | ICD-10-CM | POA: Diagnosis not present

## 2019-07-13 DIAGNOSIS — Z8619 Personal history of other infectious and parasitic diseases: Secondary | ICD-10-CM

## 2019-07-13 DIAGNOSIS — N401 Enlarged prostate with lower urinary tract symptoms: Secondary | ICD-10-CM

## 2019-07-13 DIAGNOSIS — I10 Essential (primary) hypertension: Secondary | ICD-10-CM

## 2019-07-13 DIAGNOSIS — Z8249 Family history of ischemic heart disease and other diseases of the circulatory system: Secondary | ICD-10-CM

## 2019-07-13 DIAGNOSIS — N1831 Chronic kidney disease, stage 3a: Secondary | ICD-10-CM | POA: Diagnosis not present

## 2019-07-13 DIAGNOSIS — Z9842 Cataract extraction status, left eye: Secondary | ICD-10-CM

## 2019-07-13 DIAGNOSIS — Z0000 Encounter for general adult medical examination without abnormal findings: Secondary | ICD-10-CM | POA: Diagnosis not present

## 2019-07-13 DIAGNOSIS — E781 Pure hyperglyceridemia: Secondary | ICD-10-CM | POA: Diagnosis not present

## 2019-07-13 DIAGNOSIS — C099 Malignant neoplasm of tonsil, unspecified: Secondary | ICD-10-CM | POA: Diagnosis not present

## 2019-07-13 DIAGNOSIS — Z789 Other specified health status: Secondary | ICD-10-CM | POA: Diagnosis not present

## 2019-07-13 DIAGNOSIS — R3914 Feeling of incomplete bladder emptying: Secondary | ICD-10-CM | POA: Insufficient documentation

## 2019-07-13 DIAGNOSIS — M1990 Unspecified osteoarthritis, unspecified site: Secondary | ICD-10-CM

## 2019-07-13 DIAGNOSIS — N3941 Urge incontinence: Secondary | ICD-10-CM | POA: Insufficient documentation

## 2019-07-13 DIAGNOSIS — L570 Actinic keratosis: Secondary | ICD-10-CM

## 2019-07-13 LAB — POCT URINALYSIS DIP (PROADVANTAGE DEVICE)
Bilirubin, UA: NEGATIVE
Blood, UA: NEGATIVE
Glucose, UA: NEGATIVE mg/dL
Ketones, POC UA: NEGATIVE mg/dL
Leukocytes, UA: NEGATIVE
Nitrite, UA: NEGATIVE
Protein Ur, POC: NEGATIVE mg/dL
Specific Gravity, Urine: 1.03
Urobilinogen, Ur: 0.2
pH, UA: 6 (ref 5.0–8.0)

## 2019-07-13 MED ORDER — ICOSAPENT ETHYL 1 G PO CAPS: 2.0000 g | ORAL_CAPSULE | Freq: Two times a day (BID) | ORAL | 3 refills | Status: DC

## 2019-07-13 MED ORDER — METOPROLOL TARTRATE 25 MG PO TABS: 25.0000 mg | ORAL_TABLET | Freq: Two times a day (BID) | ORAL | 3 refills | Status: DC

## 2019-07-13 MED ORDER — FENOFIBRATE 160 MG PO TABS: 160.0000 mg | ORAL_TABLET | Freq: Every day | ORAL | 3 refills | Status: DC

## 2019-07-13 MED ORDER — LIVALO 4 MG PO TABS: 1.0000 | ORAL_TABLET | Freq: Every day | ORAL | 3 refills | Status: DC

## 2019-07-13 MED ORDER — AMLODIPINE BESYLATE 5 MG PO TABS: 5.0000 mg | ORAL_TABLET | Freq: Every day | ORAL | 3 refills | Status: DC

## 2019-07-13 MED ORDER — FINASTERIDE 5 MG PO TABS: 5.0000 mg | ORAL_TABLET | Freq: Every day | ORAL | 3 refills | Status: DC

## 2019-07-13 NOTE — Progress Notes (Signed)
Matthew Moreno is a 74 y.o. male who presents for annual wellness visit and follow-up on chronic medical conditions.  He has difficulty with incomplete emptying, decreased stream, hesitancy and occasional difficulty with urge incontinence.  He also complains of itching in his ears mainly on the outside.  His reflux seems to be under good control.  He is taking Lobello for his cholesterol.  He seems to be tolerating this but did have difficulty with other statin drugs.  He has seen cardiology in the past and has had no difficulty recently with chest pain, shortness of breath, PND.  X-ray evidence of atherosclerosis is noted.  Blood work does indicate stage III CKD.  He continues on his blood pressure medications and is having no difficulty with that.  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  Life in general is going quite well for him.  He is retired and enjoying his retirement.   Immunizations and Health Maintenance Immunization History  Administered Date(s) Administered  . Fluad Quad(high Dose 65+) 01/25/2019  . Influenza Split 03/18/2001, 03/17/2002, 03/18/2005, 02/25/2007, 02/10/2010, 01/29/2011, 02/15/2012  . Influenza, High Dose Seasonal PF 03/13/2013, 02/28/2014, 02/06/2015, 02/21/2016, 02/08/2017, 03/08/2018  . Pneumococcal Conjugate-13 11/11/2015  . Pneumococcal Polysaccharide-23 03/26/2005, 03/24/2007  . Td 06/28/1981, 03/26/2005  . Tdap 11/12/2015, 05/01/2018  . Zoster 05/30/2010   There are no preventive care reminders to display for this patient.  Last colonoscopy: cologuard 06-07-18 Last PSA: 11/11/15 Dentist: one year Ophtho: one year ago Exercise: walking   Other doctors caring for patient include: Matthew Moreno Oncology, Matthew Moreno Cardio, Matthew Moreno GI  Advanced Directives: not on file info asked for Does Patient Have a Medical Advance Directive?: No Would patient  like information on creating a medical advance directive?: No - Patient declined  Depression screen:  See questionnaire below.     Depression screen [facility] 2/9 07/13/2019 03/02/2019 05/24/2018 04/02/2017 03/30/2017  Decreased Interest 0 0 0 0 0  Down, Depressed, Hopeless 0 0 0 0 0  PHQ - 2 Score 0 0 0 0 0    Fall Screen: See Questionaire below.   Fall Risk  07/13/2019 03/02/2019 05/24/2018 04/02/2017 03/30/2017  Falls in the past year? 0 0 1 No No  Number falls in past yr: - - 0 - -  Injury with Fall? - - 1 - -  Risk for fall due to : - - Other (Comment) - -  Risk for fall due to: Comment - - scratches - -    ADL screen:  See questionnaire below.  Functional Status Survey: Is the patient deaf or have difficulty hearing?: No Does the patient have difficulty seeing, even when wearing glasses/contacts?: No Does the patient have difficulty concentrating, remembering, or making decisions?: No Does the patient have difficulty walking or climbing stairs?: No Does the patient have difficulty dressing or bathing?: No Does the patient have difficulty doing errands alone such as visiting a doctor's office or shopping?: No   Review of Systems  Constitutional: -, -unexpected weight change, -anorexia, -fatigue Allergy: -sneezing, -itching, -congestion Dermatology: denies changing moles, rash, lumps ENT: -runny nose, -ear pain, -sore throat,  Cardiology:  -chest pain, -palpitations, -orthopnea, Respiratory: -cough, -shortness of breath, -dyspnea on exertion, -wheezing,  Gastroenterology: -abdominal pain, -nausea, -vomiting, -diarrhea, -constipation, -dysphagia Hematology: -bleeding or bruising problems Musculoskeletal: -arthralgias, -myalgias, -joint swelling, -back pain, - Ophthalmology: -vision changes,  Urology: -dysuria, -difficulty urinating,  -  urinary frequency, -urgency, incontinence Neurology: -, -numbness, , -memory loss, -falls, -dizziness    PHYSICAL EXAM:   General Appearance:  Alert, cooperative, no distress, appears stated age Head: Normocephalic, without obvious abnormality, atraumatic Eyes: PERRL, conjunctiva/corneas clear, EOM's intact,  Ears: Normal TM's and external ear canals Nose: Nares normal, mucosa normal, no drainage or sinus   tenderness Throat: Lips, mucosa, and tongue normal; teeth and gums normal Neck: Supple, no lymphadenopathy, thyroid:no enlargement/tenderness/nodules; no carotid bruit or JVD.  Thickening of the neck is noted from the radiation. Lungs: Clear to auscultation bilaterally without wheezes, rales or ronchi; respirations unlabored Heart: Regular rate and rhythm, S1 and S2 normal, no murmur, rub or gallop Abdomen: Soft, non-tender, nondistended, normoactive bowel sounds, no masses, no hepatosplenomegaly Extremities: No clubbing, cyanosis or edema Pulses: 2+ and symmetric all extremities Skin: Actinic lesions noted on the forehead specifically the right lateral forehead area.   Lymph nodes: Cervical, supraclavicular, and axillary nodes normal Neurologic: CNII-XII intact, normal strength, sensation and gait; reflexes 2+ and symmetric throughout   Psych: Normal mood, affect, hygiene and grooming  ASSESSMENT/PLAN: Routine general medical examination at a health care facility  Statin intolerance    Although he is intolerant to statins he does seem to be doing well on labetalol. H/O bilateral cataract extraction  Hypertriglyceridemia - Plan: fenofibrate 160 MG tablet, icosapent Ethyl (VASCEPA) 1 g capsule  Gastroesophageal reflux disease, unspecified whether esophagitis present     Continue to use Prilosec on an as-needed basis Family history of heart disease      Follow-up regularly with cardiology Essential hypertension - Plan: CBC with Differential/Platelet, Comprehensive metabolic panel, amLODipine (NORVASC) 5 MG tablet, metoprolol tartrate (LOPRESSOR) 25 MG tablet      Continue on present medication regimen Coronary artery  disease involving native coronary artery of native heart without angina pectoris - Plan: CBC with Differential/Platelet, Comprehensive metabolic panel, Lipid panel, icosapent Ethyl (VASCEPA) 1 g capsule, metoprolol tartrate (LOPRESSOR) 25 MG tablet  Arthritis    Treat as needed History of shingles     Recommend getting the Shingrix vaccine. 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 Benign prostatic hyperplasia with incomplete bladder emptying     Treat with finasteride Urge incontinence      I discussed treatment with him but at this time he would like to hold off on treatment. Actinic keratosis     He is to follow-up with his dermatologist Dr. Jarome Matin.      Medicare Attestation I have personally reviewed: The patient's medical and social history Their use of alcohol, tobacco or illicit drugs Their current medications and supplements The patient's functional ability including ADLs,fall risks, home safety risks, cognitive, and hearing and visual impairment Diet and physical activities Evidence for depression or mood disorders  The patient's weight, height, and BMI have been recorded in the chart.  I have made referrals, counseling, and provided education to the patient based on review of the above and I have provided the patient with a written personalized care plan for preventive services.     Jill Alexanders, MD   07/13/2019

## 2019-07-13 NOTE — Addendum Note (Signed)
Addended by: Elyse Jarvis on: 07/13/2019 04:21 PM   Modules accepted: Orders

## 2019-07-13 NOTE — Patient Instructions (Signed)
  Matthew Moreno , Thank you for taking time to come for your Medicare Wellness Visit. I appreciate your ongoing commitment to your health goals. Please review the following plan we discussed and let me know if I can assist you in the future.   These are the goals we discussed: Goals   None     This is a list of the screening recommended for you and due dates:  Health Maintenance  Topic Date Due  . Cologuard (Stool DNA test)  05/30/2021  . Tetanus Vaccine  05/01/2028  . Flu Shot  Completed  .  Hepatitis C: One time screening is recommended by Center for Disease Control  (CDC) for  adults born from [birth year] through 1965.   Completed  . Pneumonia vaccines  Discontinued

## 2019-07-14 LAB — CBC WITH DIFFERENTIAL/PLATELET
Basophils Absolute: 0.1 10*3/uL (ref 0.0–0.2)
Basos: 1 %
EOS (ABSOLUTE): 0.1 10*3/uL (ref 0.0–0.4)
Eos: 1 %
Hematocrit: 44.3 % (ref 37.5–51.0)
Hemoglobin: 15.3 g/dL (ref 13.0–17.7)
Immature Grans (Abs): 0 10*3/uL (ref 0.0–0.1)
Immature Granulocytes: 0 %
Lymphocytes Absolute: 1.1 10*3/uL (ref 0.7–3.1)
Lymphs: 16 %
MCH: 32.3 pg (ref 26.6–33.0)
MCHC: 34.5 g/dL (ref 31.5–35.7)
MCV: 94 fL (ref 79–97)
Monocytes Absolute: 0.8 10*3/uL (ref 0.1–0.9)
Monocytes: 11 %
Neutrophils Absolute: 4.8 10*3/uL (ref 1.4–7.0)
Neutrophils: 71 %
Platelets: 348 10*3/uL (ref 150–450)
RBC: 4.74 x10E6/uL (ref 4.14–5.80)
RDW: 13 % (ref 11.6–15.4)
WBC: 6.8 10*3/uL (ref 3.4–10.8)

## 2019-07-14 LAB — LIPID PANEL
Chol/HDL Ratio: 4.2 ratio (ref 0.0–5.0)
Cholesterol, Total: 163 mg/dL (ref 100–199)
HDL: 39 mg/dL — ABNORMAL LOW (ref >39)
LDL Chol Calc (NIH): 89 mg/dL (ref 0–99)
Triglycerides: 207 mg/dL — ABNORMAL HIGH (ref 0–149)
VLDL Cholesterol Cal: 35 mg/dL (ref 5–40)

## 2019-07-14 LAB — COMPREHENSIVE METABOLIC PANEL
ALT: 24 IU/L (ref 0–44)
AST: 30 IU/L (ref 0–40)
Albumin/Globulin Ratio: 1.7 (ref 1.2–2.2)
Albumin: 4.7 g/dL (ref 3.7–4.7)
Alkaline Phosphatase: 62 IU/L (ref 39–117)
BUN/Creatinine Ratio: 10 (ref 10–24)
BUN: 12 mg/dL (ref 8–27)
Bilirubin Total: 0.4 mg/dL (ref 0.0–1.2)
CO2: 24 mmol/L (ref 20–29)
Calcium: 10.1 mg/dL (ref 8.6–10.2)
Chloride: 102 mmol/L (ref 96–106)
Creatinine, Ser: 1.16 mg/dL (ref 0.76–1.27)
GFR calc Af Amer: 72 mL/min/{1.73_m2} (ref >59)
GFR calc non Af Amer: 62 mL/min/{1.73_m2} (ref >59)
Globulin, Total: 2.7 g/dL (ref 1.5–4.5)
Glucose: 96 mg/dL (ref 65–99)
Potassium: 4.6 mmol/L (ref 3.5–5.2)
Sodium: 139 mmol/L (ref 134–144)
Total Protein: 7.4 g/dL (ref 6.0–8.5)

## 2019-07-14 LAB — TSH: TSH: 3.43 u[IU]/mL (ref 0.450–4.500)

## 2019-07-18 ENCOUNTER — Telehealth: Payer: Self-pay | Admitting: Family Medicine

## 2019-07-18 DIAGNOSIS — Z789 Other specified health status: Secondary | ICD-10-CM

## 2019-07-18 NOTE — Telephone Encounter (Signed)
Let him know the other drugs have more side effects than Vascepa and so I will monitor his triglycerides rather than add any other medication.

## 2019-07-18 NOTE — Telephone Encounter (Signed)
Pt was advised [facility] 

## 2019-07-18 NOTE — Telephone Encounter (Signed)
Pt called and states that the rx Vascepa was given to him at his wellness visit and he can not take that he was given this before and it caused SOB and a fast heartbeat so he was taking off it, so he is not for sure why this was given to him, he didn't reliaze this was the same medicine until he got the medicine home and paid $70 dollars for it, pt wants to know if you want him to take anything else in replace of it pt uses [facility], [address] AT [place]

## 2019-07-24 ENCOUNTER — Other Ambulatory Visit: Payer: Self-pay | Admitting: Family Medicine

## 2019-07-24 DIAGNOSIS — I2510 Atherosclerotic heart disease of native coronary artery without angina pectoris: Secondary | ICD-10-CM

## 2019-07-24 DIAGNOSIS — I10 Essential (primary) hypertension: Secondary | ICD-10-CM

## 2019-07-27 ENCOUNTER — Ambulatory Visit: Payer: Medicare Other | Attending: Internal Medicine

## 2019-07-27 DIAGNOSIS — Z23 Encounter for immunization: Secondary | ICD-10-CM

## 2019-07-27 NOTE — Progress Notes (Signed)
   Covid-19 Vaccination Clinic  Name:  Matthew Moreno    MRN: [id number] DOB: 21-Dec-1945  07/27/2019  [name] was observed post Covid-19 immunization for 15 minutes without incident. He was provided with Vaccine Information Sheet and instruction to access the V-Safe system.   [name] was instructed to call 911 with any severe reactions post vaccine: Marland Kitchen Difficulty breathing  . Swelling of face and throat  . A fast heartbeat  . A bad rash all over body  . Dizziness and weakness   Immunizations Administered    Name Date Dose VIS Date Route   Pfizer COVID-19 Vaccine 07/27/2019 10:42 AM 0.3 mL 04/28/2019 Intramuscular   Manufacturer: [place]   Lot: [id number]   [place]: [id number]

## 2019-07-31 ENCOUNTER — Telehealth: Payer: Self-pay

## 2019-07-31 MED ORDER — ALPRAZOLAM 0.5 MG PO TABS: 0.5000 mg | ORAL_TABLET | Freq: Every evening | ORAL | 1 refills | Status: DC | PRN

## 2019-07-31 NOTE — Telephone Encounter (Signed)
Pt. Called LM stating that he needs a refill on his Xanax pt. Last apt was 07/13/19.

## 2019-08-21 ENCOUNTER — Ambulatory Visit: Payer: Medicare Other | Attending: Internal Medicine

## 2019-08-21 DIAGNOSIS — Z23 Encounter for immunization: Secondary | ICD-10-CM

## 2019-08-21 NOTE — Progress Notes (Signed)
   Covid-19 Vaccination Clinic  Name:  Matthew Moreno    MRN: [id number] DOB: 09/12/1945  08/21/2019  Matthew Moreno was observed post Covid-19 immunization for 15 minutes without incident. He was provided with Vaccine Information Sheet and instruction to access the V-Safe system.   Matthew Moreno was instructed to call 911 with any severe reactions post vaccine: Marland Kitchen Difficulty breathing  . Swelling of face and throat  . A fast heartbeat  . A bad rash all over body  . Dizziness and weakness   Immunizations Administered    Name Date Dose VIS Date Route   Pfizer COVID-19 Vaccine 08/21/2019 10:44 AM 0.3 mL 04/28/2019 Intramuscular   Manufacturer: [place]   Lot: [id number]   [place]: [id number]

## 2019-08-22 ENCOUNTER — Telehealth: Payer: Self-pay | Admitting: Family Medicine

## 2019-08-22 NOTE — Telephone Encounter (Signed)
Pt called for samples of Livalo. Please advise pt at [phone].

## 2019-08-23 ENCOUNTER — Other Ambulatory Visit: Payer: Self-pay | Admitting: Family Medicine

## 2019-08-23 MED ORDER — LIVALO 4 MG PO TABS: ORAL_TABLET | ORAL | 3 refills | Status: DC

## 2019-08-23 NOTE — Telephone Encounter (Signed)
Pt picked up samples today. [place]

## 2019-10-24 ENCOUNTER — Ambulatory Visit: Payer: Medicare Other | Admitting: Family Medicine

## 2019-10-24 ENCOUNTER — Other Ambulatory Visit: Payer: Self-pay

## 2019-10-24 ENCOUNTER — Encounter: Payer: Self-pay | Admitting: Family Medicine

## 2019-10-24 VITALS — BP 128/76 | HR 85 | Temp 98.0°F | Wt 165.8 lb

## 2019-10-24 DIAGNOSIS — H6123 Impacted cerumen, bilateral: Secondary | ICD-10-CM

## 2019-10-24 NOTE — Progress Notes (Signed)
   Subjective:    Patient ID: Matthew Moreno, male    DOB: October 27, 1945, 74 y.o.   MRN: [id number]  HPI He is here for cerumen impaction removal.  He has had no earache but just decreased hearing.   Review of Systems     Objective:   Physical Exam Alert and in no distress.  Both TMs had wax and they were lavaged without difficulty.  The TMs and canals are normal.       Assessment & Plan:  Bilateral impacted cerumen Return as needed

## 2019-11-08 DIAGNOSIS — Z85828 Personal history of other malignant neoplasm of skin: Secondary | ICD-10-CM | POA: Diagnosis not present

## 2019-11-08 DIAGNOSIS — L570 Actinic keratosis: Secondary | ICD-10-CM | POA: Diagnosis not present

## 2019-11-08 DIAGNOSIS — C44319 Basal cell carcinoma of skin of other parts of face: Secondary | ICD-10-CM | POA: Diagnosis not present

## 2019-11-27 ENCOUNTER — Telehealth: Payer: Self-pay | Admitting: Family Medicine

## 2019-11-27 NOTE — Telephone Encounter (Signed)
Gave pt 4 boxes of samples

## 2019-11-27 NOTE — Telephone Encounter (Signed)
Pt called and requested SAMPLES of Livalo 4 mg.  Please call pt at [phone] when ready.

## 2019-11-27 NOTE — Telephone Encounter (Signed)
[place] for samples if we have

## 2019-11-29 ENCOUNTER — Other Ambulatory Visit: Payer: Self-pay | Admitting: Family Medicine

## 2019-11-29 DIAGNOSIS — I10 Essential (primary) hypertension: Secondary | ICD-10-CM

## 2019-11-29 DIAGNOSIS — I2510 Atherosclerotic heart disease of native coronary artery without angina pectoris: Secondary | ICD-10-CM

## 2019-12-06 DIAGNOSIS — C4441 Basal cell carcinoma of skin of scalp and neck: Secondary | ICD-10-CM | POA: Diagnosis not present

## 2019-12-06 DIAGNOSIS — Z85828 Personal history of other malignant neoplasm of skin: Secondary | ICD-10-CM | POA: Diagnosis not present

## 2019-12-06 DIAGNOSIS — C44319 Basal cell carcinoma of skin of other parts of face: Secondary | ICD-10-CM | POA: Diagnosis not present

## 2019-12-19 ENCOUNTER — Ambulatory Visit: Payer: Medicare Other | Admitting: Family Medicine

## 2019-12-19 ENCOUNTER — Other Ambulatory Visit: Payer: Self-pay

## 2019-12-19 VITALS — BP 128/82 | HR 69 | Temp 97.4°F | Wt 159.2 lb

## 2019-12-19 DIAGNOSIS — L42 Pityriasis rosea: Secondary | ICD-10-CM

## 2019-12-19 NOTE — Progress Notes (Signed)
   Subjective:    Patient ID: Matthew Moreno, male    DOB: 15-Oct-1945, 74 y.o.   MRN: [id number]  HPI He is here for evaluation of a rash that he notes started sometime within the last few days.  He has no recollection of any 1 area of rash i.e. herald spot.  It is slightly pruritic otherwise he is having no trouble.   Review of Systems     Objective:   Physical Exam Alert and in no distress.  Erythematous flat oval-shaped lesions are noted on his arms, less on his chest and definitely on his back that appear in a Christmas tree pattern.       Assessment & Plan:  Pityriasis rosea Information was given concerning this.  Explained that this is a self-limited rash that there is really no therapy.  He was comfortable with that.

## 2019-12-19 NOTE — Patient Instructions (Signed)
Pityriasis Rosea Pityriasis rosea is a rash that usually appears on the chest, abdomen, and back. It may also appear on the upper arms and upper legs. It usually begins as a single patch, and then more patches start to develop. The rash may cause mild itching, but it normally does not cause other problems. It usually goes away without treatment. However, it may take weeks or months for the rash to go away completely. What are the causes? The cause of this condition is not known. The condition does not spread from person to person (is not contagious). What increases the risk? This condition is more likely to develop in:  Persons aged 10-35 years.  Pregnant women. It is more common in the spring and fall seasons. What are the signs or symptoms? The main symptom of this condition is a rash.  The rash usually begins with a single oval patch that is larger than the ones that follow. This is called a herald patch. It generally appears a week or more before the rest of the rash appears.  When more patches start to develop, they spread quickly on the chest, abdomen, back, arms, and legs. These patches are smaller than the first one.  The patches that make up the rash are usually oval-shaped and pink or red in color. They are usually flat but may sometimes be raised so that they can be felt with a finger. They may also be finely crinkled and have a scaly ring around the edge. Some people may have mild itching and nonspecific symptoms, such as:  Nausea.  Loss of appetite.  Difficulty concentrating.  Headache.  Irritability.  Sore throat.  Mild fever. How is this diagnosed? This condition may be diagnosed based on:  Your medical history and a physical exam.  Tests to rule out other causes. This may include blood tests or a test in which a small sample of skin is removed from the rash (biopsy) and checked in a lab. How is this treated?     Treatment is not usually needed for this  condition. The rash will often go away on its own in 4-8 weeks. In some cases, a health care provider may recommend or prescribe medicine to reduce itching. Follow these instructions at home:  Take or apply over-the-counter and prescription medicines only as told by your health care provider.  Avoid scratching the affected areas of skin.  Do not take hot baths or use a sauna. Use only warm water when bathing or showering. Heat can increase itching. Adding cornstarch to your bath may help to relieve the itching.  Avoid exposure to the sun and other sources of UV light, such as tanning beds, as told by your health care provider. UV light may help the rash go away but may cause unwanted changes in skin color.  Keep all follow-up visits as told by your health care provider. This is important. Contact a health care provider if:  Your rash does not go away in 8 weeks.  Your rash gets much worse.  You have a fever.  You have swelling or pain in the rash area.  You have fluid, blood, or pus coming from the rash area. Summary  Pityriasis rosea is a rash that usually appears on the trunk of the body. It can also appear on the upper arms and upper legs.  The rash usually begins with a single oval patch (herald patch) that appears a week or more before the rest of the rash appears.   The herald patch is larger than the ones that follow.  The rash may cause mild itching, but it usually does not cause other problems. It usually goes away without treatment in 4-8 weeks.  In some cases, a health care provider may recommend or prescribe medicine to reduce itching. This information is not intended to replace advice given to you by your health care provider. Make sure you discuss any questions you have with your health care provider. Document Revised: 05/03/2017 Document Reviewed: 05/03/2017 Elsevier Patient Education  2020 Elsevier Inc.  

## 2019-12-27 ENCOUNTER — Telehealth: Payer: Self-pay | Admitting: Family Medicine

## 2019-12-27 NOTE — Telephone Encounter (Signed)
Pt called requesting sample of Livalo 4 mg   Please call

## 2019-12-27 NOTE — Telephone Encounter (Signed)
ok 

## 2019-12-27 NOTE — Telephone Encounter (Signed)
Please advise if ok . Last sample given 4/21. [place]

## 2019-12-27 NOTE — Telephone Encounter (Signed)
Pt was called to advise of samples ready for pick up

## 2019-12-30 ENCOUNTER — Other Ambulatory Visit: Payer: Self-pay | Admitting: Family Medicine

## 2019-12-30 DIAGNOSIS — I10 Essential (primary) hypertension: Secondary | ICD-10-CM

## 2020-01-12 ENCOUNTER — Other Ambulatory Visit: Payer: Medicare Other

## 2020-01-12 ENCOUNTER — Other Ambulatory Visit: Payer: Self-pay | Admitting: Critical Care Medicine

## 2020-01-12 DIAGNOSIS — Z20822 Contact with and (suspected) exposure to covid-19: Secondary | ICD-10-CM | POA: Diagnosis not present

## 2020-01-13 LAB — SARS-COV-2, NAA 2 DAY TAT

## 2020-01-13 LAB — NOVEL CORONAVIRUS, NAA: SARS-CoV-2, NAA: DETECTED — AB

## 2020-01-14 ENCOUNTER — Encounter: Payer: Self-pay | Admitting: Nurse Practitioner

## 2020-01-14 ENCOUNTER — Telehealth (HOSPITAL_COMMUNITY): Payer: Self-pay | Admitting: Nurse Practitioner

## 2020-01-14 DIAGNOSIS — U071 COVID-19: Secondary | ICD-10-CM

## 2020-01-14 DIAGNOSIS — Z8616 Personal history of COVID-19: Secondary | ICD-10-CM | POA: Insufficient documentation

## 2020-01-14 NOTE — Telephone Encounter (Signed)
Called to discuss with Matthew Moreno about Covid symptoms and the use of regeneron, a monoclonal antibody infusion for those with mild to moderate Covid symptoms and at a high risk of hospitalization.     Pt is qualified for this infusion at the [facility] infusion center due to co-morbid conditions and/or a member of an at-risk group, however declines infusion at this time. He says symptoms are mild and doesn't feel he needs MAB. Patient is fully vaccinated. Symptoms tier reviewed as well as criteria for ending isolation.  Symptoms reviewed that would warrant ED/Hospital evaluation. Preventative practices reviewed. Patient verbalized understanding. Patient advised to call back if he/she opts to proceed with infusion. Callback number provided. Urgent care and/or ER precautions given for severe symptoms. Last date eligible for infusion: 01/25/20     Patient Active Problem List   Diagnosis Date Noted  . Intolerance of drug 07/18/2019  . History of shingles 07/13/2019  . Benign prostatic hyperplasia with incomplete bladder emptying 07/13/2019  . Urge incontinence 07/13/2019  . Stage 3a chronic kidney disease 07/13/2019  . Actinic keratosis 07/13/2019  . H/O bilateral cataract extraction 03/30/2017  . Essential hypertension 01/11/2017  . Arthritis 11/11/2015  . Family history of heart disease 11/11/2015  . Tonsil cancer ([place]) 01/23/2014  . Hypertriglyceridemia 06/23/2011  . Coronary artery disease involving native coronary artery of native heart without angina pectoris 06/23/2011  . Former smoker 06/23/2011  . GERD (gastroesophageal reflux disease) 03/29/2009    Beckey Rutter, NP [facility]  [phone] [email or web address]

## 2020-01-16 ENCOUNTER — Encounter: Payer: Self-pay | Admitting: Family Medicine

## 2020-01-16 ENCOUNTER — Other Ambulatory Visit: Payer: Self-pay

## 2020-01-16 ENCOUNTER — Telehealth: Payer: Medicare Other | Admitting: Family Medicine

## 2020-01-16 VITALS — Temp 96.5°F | Wt 156.0 lb

## 2020-01-16 DIAGNOSIS — Z87891 Personal history of nicotine dependence: Secondary | ICD-10-CM

## 2020-01-16 DIAGNOSIS — R05 Cough: Secondary | ICD-10-CM

## 2020-01-16 DIAGNOSIS — I10 Essential (primary) hypertension: Secondary | ICD-10-CM

## 2020-01-16 DIAGNOSIS — U071 COVID-19: Secondary | ICD-10-CM

## 2020-01-16 DIAGNOSIS — R432 Parageusia: Secondary | ICD-10-CM | POA: Diagnosis not present

## 2020-01-16 DIAGNOSIS — R430 Anosmia: Secondary | ICD-10-CM | POA: Diagnosis not present

## 2020-01-16 DIAGNOSIS — N1831 Chronic kidney disease, stage 3a: Secondary | ICD-10-CM

## 2020-01-16 DIAGNOSIS — Z8709 Personal history of other diseases of the respiratory system: Secondary | ICD-10-CM

## 2020-01-16 DIAGNOSIS — R059 Cough, unspecified: Secondary | ICD-10-CM

## 2020-01-16 NOTE — Progress Notes (Signed)
° °  Subjective:  Documentation for virtual audio only- telephone call.   The patient was located at home. 2 patient identifiers used.  The provider was located in the office. The patient did consent to this visit and is aware of possible charges through their insurance for this visit.  The other persons participating in this telemedicine service were none. Time spent on call was 12 minutes and in review of previous records 15 minutes total.  This virtual service is not related to other E/M service within previous 7 days.   Patient ID: Matthew Moreno, male    DOB: 11-Jun-1945, 74 y.o.   MRN: [id number]  HPI Chief Complaint  Patient presents with   positive covid    positive covid- tested friday. symptoms started friday after being tested. was around friends who are positive, head cold.   States he tested positive on Friday 01/12/2020 and symptoms started that day as well.   States he feels "good" but he is coughing at times. Cough is congested. Denies fever, chills, chest pain, shortness of breath, abdominal pain, N/V/D.   Former smoker.  States he lost his sense of smell and taste   States he has a history of bronchitis and has an albuterol inhaler. States he has been using this. He coughs more afterward.   He has taken Coricidin and ibuprofen. States he has not needed it in 2 days.   He has underlying health issues including HTN and CKD  States he received a call from the IV infusion clinic that he can get the IV antibodies. He is not sure he wants to do this. Wants more information.   Reviewed allergies, medications, past medical, surgical, family, and social history.   Review of Systems Pertinent positives and negatives in the history of present illness.     Objective:   Physical Exam Temp (!) 96.5 F (35.8 C)    Wt 156 lb (70.8 kg)    BMI 25.18 kg/m   Alert and oriented and in no acute distress. Speaking in complete sentences without difficulty.         Assessment & Plan:  COVID-19 virus infection  Loss of taste  Loss of smell  Cough  Stage 3a chronic kidney disease  Former smoker  Essential hypertension  History of bronchitis  No acute distress.  He is agreeable to the IV monoclonal antibody infusion and he appears to meet criteria. I attempted to explain this process and the need for it in depth. . Discussed symptomatic treatment. Follow up with me virtually in 2 days, before the long holiday weekend.

## 2020-01-17 ENCOUNTER — Telehealth: Payer: Self-pay | Admitting: Family

## 2020-01-17 ENCOUNTER — Other Ambulatory Visit: Payer: Self-pay | Admitting: Family

## 2020-01-17 DIAGNOSIS — N1831 Chronic kidney disease, stage 3a: Secondary | ICD-10-CM

## 2020-01-17 DIAGNOSIS — Z87891 Personal history of nicotine dependence: Secondary | ICD-10-CM

## 2020-01-17 DIAGNOSIS — U071 COVID-19: Secondary | ICD-10-CM

## 2020-01-17 DIAGNOSIS — I10 Essential (primary) hypertension: Secondary | ICD-10-CM

## 2020-01-17 NOTE — Telephone Encounter (Signed)
Called to Discuss with patient about Covid symptoms and the use of the monoclonal antibody infusion for those with mild to moderate Covid symptoms and at a high risk of hospitalization.     Pt appears to qualify for this infusion due to co-morbid conditions and/or a member of an at-risk group in accordance with the FDA Emergency Use Authorization.   Matthew Moreno tested positive for COVID on 8/27. Sx onset was 01/14/20. Currently experiencing cough and loss of smell/taste. Risk factors include Chronic Kidney disease, Heart disease/hypertension, and age >=65. We discussed treatment options with Regeneron and he would like to proceed.  Hello Matthew Moreno,   You have been scheduled to receive Regeneron (the monoclonal antibody we discussed) on : 01/18/20 at 11:30am  If you have been tested outside of a [facility] - you MUST bring a copy of your positive test with you the morning of your appointment. You may take a photo of this and upload to your MyChart portal or have the testing facility fax the result to [phone]    The address for the infusion clinic site is:  --GPS address is [place] - the parking is located near [employer] building where you will see  COVID19 Infusion feather banner marking the entrance to parking.   (see photos below)            --Enter into the 2nd entrance where the "wave, flag banner" is at the road. Turn into this 2nd entrance and immediately turn left to park in 1 of the 5 parking spots.   --Please stay in your car and call the desk for assistance inside [phone].   --Average time in department is roughly 2 hours for Regeneron treatment - this includes preparation of the medication, IV start and the required 1 hour monitoring after the infusion.    Should you develop worsening shortness of breath, chest pain or severe breathing problems please do not wait for this appointment and go to the Emergency room for evaluation and  treatment. You will undergo another oxygen screen before your infusion to ensure this is the best treatment option for you. There is a chance that the best decision may be to send you to the Emergency Room for evaluation at the time of your appointment.   The day of your visit you should: Marland Kitchen Get plenty of rest the night before and drink plenty of water . Eat a light meal/snack before coming and take your medications as prescribed  . Wear warm, comfortable clothes with a shirt that can roll-up over the elbow (will need IV start).  . Wear a mask  . Consider bringing some activity to help pass the time  Many commercial insurers are waiving bills related to [place] treatment however some have ranged from $300-640. We are starting to see some insurers send bills to patients later for the administration of the medication - we are learning more information but you may receive a bill after your appointment.  Please contact your insurance agent to discuss prior to your appointment if you would like further details about billing specific to your policy.    Terri Piedra, NP 01/17/2020 11:50 AM

## 2020-01-17 NOTE — Progress Notes (Signed)
I connected by phone with Matthew Moreno on 01/17/2020 at 11:54 AM to discuss the potential use of a new treatment for mild to moderate COVID-19 viral infection in non-hospitalized patients.  This patient is a 74 y.o. male that meets the FDA criteria for Emergency Use Authorization of COVID monoclonal antibody casirivimab/imdevimab.  Has a (+) direct SARS-CoV-2 viral test result  Has mild or moderate COVID-19   Is NOT hospitalized due to COVID-19  Is within 10 days of symptom onset  Has at least one of the high risk factor(s) for progression to severe COVID-19 and/or hospitalization as defined in EUA.  Specific high risk criteria : Chronic Kidney Disease (CKD), Cardiovascular disease or hypertension and Chronic Lung Disease   I have spoken and communicated the following to the patient or parent/caregiver regarding COVID monoclonal antibody treatment:  1. FDA has authorized the emergency use for the treatment of mild to moderate COVID-19 in adults and pediatric patients with positive results of direct SARS-CoV-2 viral testing who are 68 years of age and older weighing at least 40 kg, and who are at high risk for progressing to severe COVID-19 and/or hospitalization.  2. The significant known and potential risks and benefits of COVID monoclonal antibody, and the extent to which such potential risks and benefits are unknown.  3. Information on available alternative treatments and the risks and benefits of those alternatives, including clinical trials.  4. Patients treated with COVID monoclonal antibody should continue to self-isolate and use infection control measures (e.g., wear mask, isolate, social distance, avoid sharing personal items, clean and disinfect "high touch" surfaces, and frequent handwashing) according to CDC guidelines.   5. The patient or parent/caregiver has the option to accept or refuse COVID monoclonal antibody treatment.  After reviewing this information with the  patient, The patient agreed to proceed with receiving casirivimab\imdevimab infusion and will be provided a copy of the Fact sheet prior to receiving the infusion.    [name] 01/17/2020 11:54 AM

## 2020-01-18 ENCOUNTER — Ambulatory Visit (HOSPITAL_COMMUNITY)
Admission: RE | Admit: 2020-01-18 | Discharge: 2020-01-18 | Disposition: A | Discharge status: Home / Self Care | Payer: Medicare Other | Source: Ambulatory Visit | Attending: Pulmonary Disease | Admitting: Pulmonary Disease

## 2020-01-18 ENCOUNTER — Telehealth (INDEPENDENT_AMBULATORY_CARE_PROVIDER_SITE_OTHER): Payer: Medicare Other | Admitting: Family Medicine

## 2020-01-18 ENCOUNTER — Encounter: Payer: Self-pay | Admitting: Family Medicine

## 2020-01-18 VITALS — Temp 98.2°F | Wt 156.0 lb

## 2020-01-18 DIAGNOSIS — U071 COVID-19: Secondary | ICD-10-CM | POA: Insufficient documentation

## 2020-01-18 DIAGNOSIS — Z23 Encounter for immunization: Secondary | ICD-10-CM | POA: Diagnosis not present

## 2020-01-18 DIAGNOSIS — Z87891 Personal history of nicotine dependence: Secondary | ICD-10-CM | POA: Insufficient documentation

## 2020-01-18 DIAGNOSIS — N1831 Chronic kidney disease, stage 3a: Secondary | ICD-10-CM | POA: Diagnosis present

## 2020-01-18 DIAGNOSIS — I10 Essential (primary) hypertension: Secondary | ICD-10-CM | POA: Diagnosis present

## 2020-01-18 MED ORDER — SODIUM CHLORIDE 0.9 % IV SOLN: INTRAVENOUS | Status: DC | PRN

## 2020-01-18 MED ORDER — METHYLPREDNISOLONE SODIUM SUCC 125 MG IJ SOLR: 125.0000 mg | Freq: Once | INTRAMUSCULAR | Status: DC | PRN

## 2020-01-18 MED ORDER — SODIUM CHLORIDE 0.9 % IV SOLN
1200.0000 mg | Freq: Once | INTRAVENOUS | Status: AC
  Administered 2020-01-18: 1200 mg via INTRAVENOUS
  Filled 2020-01-18: qty 10

## 2020-01-18 MED ORDER — EPINEPHRINE 0.3 MG/0.3ML IJ SOAJ: 0.3000 mg | Freq: Once | INTRAMUSCULAR | Status: DC | PRN

## 2020-01-18 MED ORDER — FAMOTIDINE IN NACL 20-0.9 MG/50ML-% IV SOLN: 20.0000 mg | Freq: Once | INTRAVENOUS | Status: DC | PRN

## 2020-01-18 MED ORDER — DIPHENHYDRAMINE HCL 50 MG/ML IJ SOLN: 50.0000 mg | Freq: Once | INTRAMUSCULAR | Status: DC | PRN

## 2020-01-18 MED ORDER — ALBUTEROL SULFATE HFA 108 (90 BASE) MCG/ACT IN AERS: 2.0000 | INHALATION_SPRAY | Freq: Once | RESPIRATORY_TRACT | Status: DC | PRN

## 2020-01-18 NOTE — Progress Notes (Signed)
  Diagnosis: COVID-19  Physician: Dr. Asencion Noble  Procedure: Cov[facility] Infusion Clinic Med: casirivimab\imdevimab infusion - Provided patient with casirivimab\imdevimab fact sheet for patients, parents and caregivers prior to infusion.  Complications: No immediate complications noted.  Discharge: Discharged home   Gaye Alken 01/18/2020

## 2020-01-18 NOTE — Discharge Instructions (Signed)

## 2020-01-18 NOTE — Progress Notes (Signed)
   Subjective:  Documentation for virtual audio - telephone encounter:  The patient was located at home. 2 patient identifiers used.  The provider was located in the office. The patient did consent to this visit and is aware of possible charges through their insurance for this visit.  The other persons participating in this telemedicine service were none. Time spent on call was 11 minutes and in review of previous records 11 minutes total.  This virtual service is not related to other E/M service within previous 7 days.   Patient ID: Matthew Moreno, male    DOB: [birth year], 74 y.o.   MRN: [id number]  HPI Chief Complaint  Patient presents with  . follow-up    follow-up covid infusion- no symptoms, feeling somewhat back to normal. no fever in 4 days. had infusion today   Positive for Covid and symptomatic on 01/12/2020.  He received IV antibody infusion earlier today.   States he is feeling better than our last visit.  Has not needed his albuterol inhaler today.  Denies any new symptoms.  States he should be out of quarantine 01/22/2020     Review of Systems Pertinent positives and negatives in the history of present illness.     Objective:   Physical Exam Temp 98.2 F (36.8 C)   Wt 156 lb (70.8 kg)   BMI 25.18 kg/m   Alert and oriented in no acute distress.  Speaking in complete senses without difficulty.  Normal speech, mood and thought process      Assessment & Plan:  COVID-19 virus infection  Doing well, actually improving. Sense of smell returned.  IV antibodies received today. Out of quarantine on 01/22/2020  He will follow up as needed.

## 2020-01-25 ENCOUNTER — Encounter: Payer: Self-pay | Admitting: Internal Medicine

## 2020-01-25 ENCOUNTER — Ambulatory Visit (INDEPENDENT_AMBULATORY_CARE_PROVIDER_SITE_OTHER): Payer: Medicare Other | Admitting: Internal Medicine

## 2020-01-25 ENCOUNTER — Other Ambulatory Visit: Payer: Self-pay

## 2020-01-25 VITALS — BP 125/76 | HR 67 | Temp 97.5°F | Ht 67.0 in | Wt 155.0 lb

## 2020-01-25 DIAGNOSIS — E781 Pure hyperglyceridemia: Secondary | ICD-10-CM | POA: Diagnosis not present

## 2020-01-25 DIAGNOSIS — I2510 Atherosclerotic heart disease of native coronary artery without angina pectoris: Secondary | ICD-10-CM

## 2020-01-25 DIAGNOSIS — G720 Drug-induced myopathy: Secondary | ICD-10-CM

## 2020-01-25 DIAGNOSIS — I10 Essential (primary) hypertension: Secondary | ICD-10-CM

## 2020-01-25 DIAGNOSIS — T466X5A Adverse effect of antihyperlipidemic and antiarteriosclerotic drugs, initial encounter: Secondary | ICD-10-CM

## 2020-01-25 NOTE — Progress Notes (Signed)
OFFICE NOTE  Chief Complaint:  No complaints  Primary Care Physician: Denita Lung, MD  HPI:  Matthew Moreno is a 74 year old gentleman we are following for history of coronary disease, moderate, in 2010 on catheterization, 50% LAD, 40% RCA and 50% OM lesion. He also has hypertension difficult to control, dyslipidemia and intolerance to statins. He has returned today with worsening leg pain which now he is attributing to fenofibrate. His most recent lipid profile obtained on April 11, 2012, showed total cholesterol 241, triglycerides 217, LDL was 158, HDL 40. His triglycerides are actually down from about 400 last year with the fenofibrate so that seems to be working. Unfortunately, he was directed to take Livalo 2 mg daily last year at his last visit but did not start that and is now contemplating discontinuing the fenofibrate as he is concerned this is causing his muscle pain. Since his last visit, he has stopped both the little and fenofibrate. Prior to his last blood work, he went out with some friends and was drinking bourbon in his triglycerides were 1400. Therefore difficult to know what his LDL actually is without measuring a direct LDL. We discussed options as alternatives to statins including WelChol and his idea, however he is not interested in any further medications.  Matthew Moreno has no new complaints today. 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 He is undergone radiation therapy and is apparently clear of this. Unfortunately he was intolerant to the statins and cannot afford to take fenofibrate. He is currently not on any therapy for lipid lowering and does have moderate coronary artery disease. We have discussed this in the past and felt there were a few additional options. He does say that he lost 20 or 30 pounds recently due to his cancer and wonders if his cholesterol is improved.  01/10/2016  Matthew Moreno returns today for follow-up. He seems to  be doing fairly well although recently he's had some worsening shortness of breath and fatigue. This is mostly with exertion. 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 Several days he's had to go back home and go to sleep for the rest of the day. He does not associate this with the hot weather. He denies any chest pain with this but has been more fatigued recently. He is concern is a friend of his had some similar symptoms and underwent a bypass surgery. Mr. Ralene Moreno does have a history of moderate coronary artery disease by catheterization in 2010. He's had difficult to control cholesterol due to intolerance to statins but has been able to take Livalo. Recent lab work indicated much higher triglycerides been previously, in fact greater than 400 therefore LDL was not calculated, however LDL last year was 102.  01/11/2017  [name] is seen today in follow-up. Overall he is asymptomatic. He was having some shortness of breath but that improved. We did a stress test last year which was negative for ischemia. He's gained about 15 pounds since we last saw him, he feels that he's eating last however gaining weight. He denies any swelling. He's not had any significant muscle mass. He generally eats a large "country" breakfast, usually a sandwich for lunch and very little if anything for dinner. He's had elevated triglycerides in the past and an LDL over 100. I started him on Livalo which she is tolerating, but is not made a significant improvement in triglycerides rather his LDL was lower in the  60s. Fasting blood glucose is been in the low 100s in the past but he is not known to be diabetic.  01/12/2018  Matthew Moreno is seen today in follow-up.  Overall he continues to be asymptomatic.  He denies any chest pain or worsening shortness of breath.  Weight has been stable.  His last lipid profile 9 months ago by his PCP was not a goal showing total cholesterol 221, HDL 42,  triglycerides 345 and LDL 130.  Non-HDL cholesterol significantly elevated at 180 suggesting significant residual risk.  01/24/2019  Matthew Moreno is seen today in follow-up.  He denies any chest pain or worsening shortness of breath.  Blood pressure was elevated today 164/84 and a recheck came down to 142/74.  He says at home his blood pressure ranges from the low 130s up to 150s consistently.  He is only on half tablet of the metoprolol.  He does not think that that is making a significant effect on his blood pressure.  Cholesterol has not been reassessed recently and he is overdue for that.  He did have triglycerides checked in April which were elevated and remain persistently elevated.  He is possibly a good candidate for Vascepa about which we discussed today.  01/25/2020  Matthew Moreno is seen today in follow-up.  Overall he is feeling well.  However this past month was not so good.  He went on a trip apparently out to [place] with some friends and somebody had COVID-19.  He has been vaccinated fully with his last shot in April of the [employer] vaccine but did ultimately contract COVID-19.  He said he had 1 to 2 days of fever and ultimately felt that it might be going to his lungs and therefore got monoclonal antibody therapy through the [facility] outpatient clinic.  This worked almost immediately and he said he felt much better.  He is now out of quarantine.  Otherwise he is asymptomatic.  He did have lab work in February 2021 with his PCP showing total cholesterol 163, HDL 39, LDL 89 and triglycerides 207.  PMHx:  Past Medical History:  Diagnosis Date  . CAD (coronary artery disease) 2008  . Dyslipidemia   . Dysphagia   . Family history of heart disease   . Hypertension   . Nephrolithiasis    H/O multiple "kidney stones", Lithotripsies, J-Stents  . Panic attacks   . S/P radiation therapy 02/12/2014-04/02/2014   Left tonsil and bilateral neck / 70 Gy in 35 fractions to gross disease, 63 Gy in 35  fractions to high risk nodalechelons, and 56 Gy in 35 fractions to intermediate risk nodal echelons   . Statin intolerance   . Tonsil cancer ([place]) 01/05/14   left    Past Surgical History:  Procedure Laterality Date  . BICEPS TENDON REPAIR Left   . CARDIAC CATHETERIZATION  06/19/2008   normal left main, Cfx with 3 OMs (50% osital narrowing in OM1), LAD densely calcified in the proximal half of vessel with eccentric narrowing in mid region (40-50% severity), diag 1 & 2 free of disease, RCA with less than 30% narrowing in midportion (Dr. Loni Muse. [place])  . COLONOSCOPY  2010   Dr. Ardis Hughs  . ESOPHAGOGASTRODUODENOSCOPY N/A 09/04/2018   Procedure: ESOPHAGOGASTRODUODENOSCOPY (EGD);  Surgeon: Carol Ada, MD;  Location: [facility];  Service: Endoscopy;  Laterality: N/A;  . EYE SURGERY Bilateral    Cataract  . FOREIGN BODY REMOVAL  09/04/2018   Procedure: FOREIGN BODY REMOVAL;  Surgeon: Carol Ada, MD;  Location: [facility];  Service: Endoscopy;;    FAMHx:  Family History  Problem Relation Age of Onset  . Heart Problems Mother   . Heart Problems Father     SOCHx:   reports that he quit smoking about 36 years ago. His smoking use included cigarettes. He has a 15.00 pack-year smoking history. He has never used smokeless tobacco. He reports current alcohol use of about 3.0 standard drinks of alcohol per week. He reports that he does not use drugs.  ALLERGIES:  Allergies  Allergen Reactions  . Bystolic [Nebivolol Hcl]   . Lipitor [Atorvastatin] Other (See Comments)    Muscle aches with statins  . Lisinopril   . Pravastatin   . Red Yeast Rice [Cholestin]   . Sulfonamide Derivatives     REACTION: hives  . Zocor [Simvastatin]     ROS: Pertinent items noted in HPI and remainder of comprehensive ROS otherwise negative.  HOME MEDS: Current Outpatient Medications  Medication Sig Dispense Refill  . albuterol (VENTOLIN HFA) 108 (90 Base) MCG/ACT inhaler Inhale 2 puffs into the lungs every  6 (six) hours as needed for wheezing or shortness of breath.    . ALPRAZolam (XANAX) 0.5 MG tablet Take 1 tablet (0.5 mg total) by mouth at bedtime as needed. 30 tablet 1  . amLODipine (NORVASC) 5 MG tablet TAKE 1 TABLET(5 MG) BY MOUTH DAILY 90 tablet 3  . aspirin 81 MG tablet Take 81 mg by mouth daily.    . calcium carbonate (TUMS EX) 750 MG chewable tablet Chew 1 tablet by mouth daily.    . fenofibrate 160 MG tablet Take 1 tablet (160 mg total) by mouth daily. 90 tablet 3  . metoprolol tartrate (LOPRESSOR) 25 MG tablet TAKE 1/2 TABLET(12.5 MG) BY MOUTH TWICE DAILY 90 tablet 1  . omeprazole (PRILOSEC) 20 MG capsule Take 20 mg by mouth daily.    . Pitavastatin Calcium (LIVALO) 4 MG TABS Samples given 2 boxes of 4 mg and 4 boxes of 2 mg 90 tablet 3  . sodium fluoride (FLUORISHIELD) 1.1 % GEL dental gel Instill 1 drop into each tooth space of fluoride tray. Place over teeth for 5 minutes. Remove. Spit out excess. Repeat nightly. 120 mL prn   No current facility-administered medications for this visit.   Facility-Administered Medications Ordered in Other Visits  Medication Dose Route Frequency Provider Last Rate Last Admin  . influenza  inactive virus vaccine (FLUZONE/FLUARIX) injection 0.5 mL  0.5 mL Intramuscular Once Denita Lung, MD        LABS/IMAGING: No results found for this or any previous visit (from the past 48 hour(s)). No results found.  VITALS: BP 125/76   Pulse 67   Temp (!) 97.5 F (36.4 C)   Ht 5\' 7"  (1.702 m)   Wt 155 lb (70.3 kg)   SpO2 99%   BMI 24.28 kg/m   EXAM: General appearance: alert and no distress Neck: no carotid bruit, no JVD and thyroid not enlarged, symmetric, no tenderness/mass/nodules Lungs: clear to auscultation bilaterally Heart: regular rate and rhythm, S1, S2 normal, no murmur, click, rub or gallop Abdomen: soft, non-tender; bowel sounds normal; no masses,  no organomegaly Extremities: extremities normal, atraumatic, no cyanosis or  edema Pulses: 2+ and symmetric Skin: Skin color, texture, turgor normal. No rashes or lesions Neurologic: Grossly normal Psych: Pleasant  EKG: Normal sinus rhythm at 67, voltage criteria for LVH-personally reviewed  ASSESSMENT: 1. Moderate nonobstructive coronary artery disease (2010 cath) 2. Hypertension 3. Dyslipidemia -  on Livalo 4. Recent tonsillar cancer 5. DOE - LVEF 60-65%, mild AI (no discernible murmur)  PLAN: 1.   Mr. Advincula is doing better although recently had COVID-19 despite being fully vaccinated and taking precautions.  He did get monoclonal antibody treatment and is symptomatically improved.  He is finished quarantine.  No chest pain symptoms.  Blood pressures well controlled.  His cholesterol is pretty good with mildly elevated triglycerides.  No recurrence of tonsillar cancer that he is aware of.  No changes to his medicines today.  Follow-up with me annually or sooner as necessary.  Pixie Casino, MD, [facility], [place] Director of the Advanced Lipid Disorders &  Cardiovascular Risk Reduction Clinic Diplomate of the American Board of Clinical Lipidology Attending Cardiologist  Direct Dial: [phone]  Fax: [phone]  Website:  [email or web address] Licht 01/25/2020, 10:19 AM

## 2020-01-25 NOTE — Patient Instructions (Signed)
Medication Instructions:  No changes  *If you need a refill on your cardiac medications before your next appointment, please call your pharmacy*   Lab Work: Not needed If you have labs (blood work) drawn today and your tests are completely normal, you will receive your results only by: Marland Kitchen MyChart Message (if you have MyChart) OR . A paper copy in the mail If you have any lab test that is abnormal or we need to change your treatment, we will call you to review the results.   Testing/Procedures: Not needed   Follow-Up: At [facility], you and your health needs are our priority.  As part of our continuing mission to provide you with exceptional heart care, we have created designated Provider Care Teams.  These Care Teams include your primary Cardiologist (physician) and Advanced Practice Providers (APPs -  Physician Assistants and Nurse Practitioners) who all work together to provide you with the care you need, when you need it.    Your next appointment:   12 month(s)  The format for your next appointment:   In Person  Provider:   K. Mali Hilty, MD

## 2020-02-08 ENCOUNTER — Telehealth: Payer: Self-pay | Admitting: Family Medicine

## 2020-02-08 NOTE — Telephone Encounter (Signed)
Pt was advised Matthew Moreno 

## 2020-02-08 NOTE — Telephone Encounter (Signed)
Pt was wondering if he needs to get the flu vaccine since he just recently had an infusion and if so how soon can he get it.

## 2020-02-08 NOTE — Telephone Encounter (Signed)
To be safe I would wait a couple weeks and then get the shot

## 2020-03-06 ENCOUNTER — Other Ambulatory Visit (INDEPENDENT_AMBULATORY_CARE_PROVIDER_SITE_OTHER): Payer: Medicare Other

## 2020-03-06 ENCOUNTER — Other Ambulatory Visit: Payer: Self-pay

## 2020-03-06 DIAGNOSIS — Z23 Encounter for immunization: Secondary | ICD-10-CM

## 2020-04-09 DIAGNOSIS — G720 Drug-induced myopathy: Secondary | ICD-10-CM | POA: Insufficient documentation

## 2020-04-09 DIAGNOSIS — T466X5A Adverse effect of antihyperlipidemic and antiarteriosclerotic drugs, initial encounter: Secondary | ICD-10-CM | POA: Insufficient documentation

## 2020-05-14 ENCOUNTER — Telehealth: Payer: Self-pay

## 2020-05-14 NOTE — Telephone Encounter (Signed)
Pt called for samples Livalo 4mg , pt has CPE scheduled 2/22, #5 boxes given to pt

## 2020-05-27 ENCOUNTER — Other Ambulatory Visit: Payer: Self-pay | Admitting: Family Medicine

## 2020-05-27 DIAGNOSIS — I2510 Atherosclerotic heart disease of native coronary artery without angina pectoris: Secondary | ICD-10-CM

## 2020-05-27 DIAGNOSIS — I10 Essential (primary) hypertension: Secondary | ICD-10-CM

## 2020-07-15 ENCOUNTER — Ambulatory Visit: Payer: Medicare Other | Admitting: Family Medicine

## 2020-07-18 ENCOUNTER — Ambulatory Visit (INDEPENDENT_AMBULATORY_CARE_PROVIDER_SITE_OTHER): Payer: Medicare Other | Admitting: Family Medicine

## 2020-07-18 ENCOUNTER — Other Ambulatory Visit: Payer: Self-pay

## 2020-07-18 ENCOUNTER — Encounter: Payer: Self-pay | Admitting: Family Medicine

## 2020-07-18 VITALS — BP 148/86 | HR 71 | Temp 97.9°F | Ht 65.75 in | Wt 156.8 lb

## 2020-07-18 DIAGNOSIS — N3941 Urge incontinence: Secondary | ICD-10-CM

## 2020-07-18 DIAGNOSIS — I10 Essential (primary) hypertension: Secondary | ICD-10-CM

## 2020-07-18 DIAGNOSIS — E781 Pure hyperglyceridemia: Secondary | ICD-10-CM | POA: Diagnosis not present

## 2020-07-18 DIAGNOSIS — N1831 Chronic kidney disease, stage 3a: Secondary | ICD-10-CM

## 2020-07-18 DIAGNOSIS — R3914 Feeling of incomplete bladder emptying: Secondary | ICD-10-CM

## 2020-07-18 DIAGNOSIS — Z0000 Encounter for general adult medical examination without abnormal findings: Secondary | ICD-10-CM | POA: Diagnosis not present

## 2020-07-18 DIAGNOSIS — L570 Actinic keratosis: Secondary | ICD-10-CM

## 2020-07-18 DIAGNOSIS — K219 Gastro-esophageal reflux disease without esophagitis: Secondary | ICD-10-CM

## 2020-07-18 DIAGNOSIS — M19041 Primary osteoarthritis, right hand: Secondary | ICD-10-CM

## 2020-07-18 DIAGNOSIS — I2510 Atherosclerotic heart disease of native coronary artery without angina pectoris: Secondary | ICD-10-CM

## 2020-07-18 DIAGNOSIS — Z8616 Personal history of COVID-19: Secondary | ICD-10-CM

## 2020-07-18 DIAGNOSIS — C099 Malignant neoplasm of tonsil, unspecified: Secondary | ICD-10-CM

## 2020-07-18 DIAGNOSIS — Z8249 Family history of ischemic heart disease and other diseases of the circulatory system: Secondary | ICD-10-CM

## 2020-07-18 DIAGNOSIS — N401 Enlarged prostate with lower urinary tract symptoms: Secondary | ICD-10-CM

## 2020-07-18 DIAGNOSIS — M19042 Primary osteoarthritis, left hand: Secondary | ICD-10-CM

## 2020-07-18 DIAGNOSIS — G720 Drug-induced myopathy: Secondary | ICD-10-CM

## 2020-07-18 MED ORDER — FENOFIBRATE 160 MG PO TABS: 160.0000 mg | ORAL_TABLET | Freq: Every day | ORAL | 3 refills | Status: DC

## 2020-07-18 MED ORDER — AMLODIPINE BESYLATE 5 MG PO TABS: ORAL_TABLET | ORAL | 3 refills | Status: DC

## 2020-07-18 MED ORDER — METOPROLOL TARTRATE 25 MG PO TABS: ORAL_TABLET | ORAL | 3 refills | Status: DC

## 2020-07-18 MED ORDER — LIVALO 4 MG PO TABS
1.0000 | ORAL_TABLET | Freq: Every day | ORAL | 3 refills | Status: DC
Start: 2020-07-18 — End: 2021-04-17

## 2020-07-18 NOTE — Patient Instructions (Signed)
  Matthew Moreno , Thank you for taking time to come for your Medicare Wellness Visit. I appreciate your ongoing commitment to your health goals. Please review the following plan we discussed and let me know if I can assist you in the future.   These are the goals we discussed: Goals   None     This is a list of the screening recommended for you and due dates:  Health Maintenance  Topic Date Due  . COVID-19 Vaccine (3 - Pfizer risk 4-dose series) 09/18/2019  . Cologuard (Stool DNA test)  05/30/2021  . Tetanus Vaccine  05/01/2028  . Flu Shot  Completed  .  Hepatitis C: One time screening is recommended by Center for Disease Control  (CDC) for  adults born from [birth year] through 1965.   Completed  . HPV Vaccine  Aged Out  . Pneumonia vaccines  Discontinued

## 2020-07-18 NOTE — Progress Notes (Signed)
Matthew Moreno is a 75 y.o. male who presents for annual wellness visit,CPE and follow-up on chronic medical conditions.  He has had occasional difficulty with cramping on the right calf and also in the hamstrings especially when he walks up a hill. He can work this out without much difficulty. He continues on low Ballo. Does have statin intolerance to all other statin drugs. He continues on metoprolol, amlodipine. Does follow-up with cardiology periodically. 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 He does use Prilosec for his reflux. Rarely uses Xanax. His brother is now living with him. Apparently his brother has stomach cancer and is now doing much better. His immunizations were reviewed and he has not had Shingrix shots yet. Still does have occasional difficulty with BPH symptoms and is not interested in medication. Does have arthritis of his hands but states it is not causing a lot of difficulty. Does have x-ray evidence of stage III CKD. Does see dermatology for treatment of AK and has had some treated recently. He did get Covid last year and did get IV therapy for it.   Immunizations and Health Maintenance Immunization History  Administered Date(s) Administered  . Fluad Quad(high Dose 65+) 01/25/2019, 03/06/2020  . Influenza Split 03/18/2001, 03/17/2002, 03/18/2005, 02/25/2007, 02/10/2010, 01/29/2011, 02/15/2012  . Influenza, High Dose Seasonal PF 03/13/2013, 02/28/2014, 02/06/2015, 02/21/2016, 02/08/2017, 03/08/2018  . PFIZER(Purple Top)SARS-COV-2 Vaccination 07/27/2019, 08/21/2019  . Pneumococcal Conjugate-13 11/11/2015  . Pneumococcal Polysaccharide-23 03/26/2005, 03/24/2007  . Td 06/28/1981, 03/26/2005  . Tdap 11/12/2015, 05/01/2018  . Zoster 05/30/2010   Health Maintenance Due  Topic Date Due  . COVID-19 Vaccine (3 - Pfizer risk 4-dose series) 09/18/2019    Last colonoscopy: 12/17/2008 cologuard 05/30/2018 Last PSA: 11/11/15 Dentist: Q year Ophtho:  N/A Exercise: waling 30 min seven days a week  Other doctors caring for patient include: Dr. Geralyn Corwin  Cardio, Dr, Ardis Hughs GI  Advanced Directives: Does Patient Have a Medical Advance Directive?: Yes Type of Advance Directive: Living will,Healthcare Power of [place] in Chart?: No - copy requested  Depression screen:  See questionnaire below.     Depression screen [facility] 2/9 07/18/2020 07/13/2019 03/02/2019 05/24/2018 04/02/2017  Decreased Interest 0 0 0 0 0  Down, Depressed, Hopeless 0 0 0 0 0  PHQ - 2 Score 0 0 0 0 0  Some recent data might be hidden    Fall Screen: See Questionaire below.   Fall Risk  07/18/2020 07/13/2019 03/02/2019 05/24/2018 04/02/2017  Falls in the past year? 0 0 0 1 No  Number falls in past yr: 0 - - 0 -  Injury with Fall? 0 - - 1 -  Risk for fall due to : No Fall Risks - - Other (Comment) -  Risk for fall due to: Comment - - - scratches -  Follow up Falls evaluation completed - - - -    ADL screen:  See questionnaire below.  Functional Status Survey: Is the patient deaf or have difficulty hearing?: No Does the patient have difficulty seeing, even when wearing glasses/contacts?: No Does the patient have difficulty concentrating, remembering, or making decisions?: No Does the patient have difficulty walking or climbing stairs?: Yes (walking up hills) Does the patient have difficulty dressing or bathing?: No Does the patient have difficulty doing errands alone such as visiting a doctor's office or shopping?: No   Review of Systems  Constitutional: -, -unexpected weight change, -anorexia, -fatigue Allergy: -sneezing, -itching, -  congestion Dermatology: denies changing moles, rash, lumps ENT: -runny nose, -ear pain, -sore throat,  Cardiology:  -chest pain, -palpitations, -orthopnea, Respiratory: -cough, -shortness of breath, -dyspnea on exertion, -wheezing,  Gastroenterology: -abdominal pain, -nausea, -vomiting, -diarrhea,  -constipation, -dysphagia Hematology: -bleeding or bruising problems Musculoskeletal: -arthralgias, -myalgias, -joint swelling, -back pain, - Ophthalmology: -vision changes,  Urology: -dysuria, -difficulty urinating,  -urinary frequency, -urgency, incontinence Neurology: -, -numbness, , -memory loss, -falls, -dizziness    PHYSICAL EXAM:   General Appearance: Alert, cooperative, no distress, appears stated age Head: Normocephalic, without obvious abnormality, atraumatic Eyes: PERRL, conjunctiva/corneas clear, EOM's intact, Ears: Normal TM's and external ear canals Nose: Nares normal, mucosa normal, no drainage or sinus   tenderness Throat: Lips, mucosa, and tongue normal; teeth and gums normal Neck: Supple, no lymphadenopathy, thyroid:no enlargement/tenderness/nodules; no carotid bruit or JVD Lungs: Clear to auscultation bilaterally without wheezes, rales or ronchi; respirations unlabored Heart: Regular rate and rhythm, S1 and S2 normal, no murmur, rub or gallop Abdomen: Soft, non-tender, nondistended, normoactive bowel sounds, no masses, no hepatosplenomegaly Extremities: No clubbing, cyanosis or edema Pulses: 2+ and symmetric all extremities Skin: Skin color, texture, turgor normal, no rashes or lesions Lymph nodes: Cervical, supraclavicular, and axillary nodes normal Neurologic: CNII-XII intact, normal strength, sensation and gait; reflexes 2+ and symmetric throughout   Psych: Normal mood, affect, hygiene and grooming  ASSESSMENT/PLAN: Routine general medical examination at a health care facility - Plan: CBC with Differential/Platelet, Comprehensive metabolic panel, Lipid panel  Gastroesophageal reflux disease, unspecified whether esophagitis present  Hypertriglyceridemia - Plan: Lipid panel, fenofibrate 160 MG tablet  Coronary artery disease involving native coronary artery of native heart without angina pectoris - Plan: Lipid panel, metoprolol tartrate (LOPRESSOR) 25 MG  tablet  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  He is not interested in any further treatment of the incontinence.  He will continue be followed by dermatology for his skin.  Monitor the CKD.     Immunization recommendations discussed.  Colonoscopy recommendations reviewed.   Medicare Attestation I have personally reviewed: The patient's medical and social history Their use of alcohol, tobacco or illicit drugs Their current medications and supplements The patient's functional ability including ADLs,fall risks, home safety risks, cognitive, and hearing and visual impairment Diet and physical activities Evidence for depression or mood disorders  The patient's weight, height, and BMI have been recorded in the chart.  I have made referrals, counseling, and provided education to the patient based on review of the above and I have provided the patient with a written personalized care plan for preventive services.     Jill Alexanders, MD   07/18/2020

## 2020-07-19 LAB — CBC WITH DIFFERENTIAL/PLATELET
Basophils Absolute: 0.1 10*3/uL (ref 0.0–0.2)
Basos: 1 %
EOS (ABSOLUTE): 0.1 10*3/uL (ref 0.0–0.4)
Eos: 1 %
Hematocrit: 45.3 % (ref 37.5–51.0)
Hemoglobin: 15.3 g/dL (ref 13.0–17.7)
Immature Grans (Abs): 0 10*3/uL (ref 0.0–0.1)
Immature Granulocytes: 0 %
Lymphocytes Absolute: 1.2 10*3/uL (ref 0.7–3.1)
Lymphs: 18 %
MCH: 32.2 pg (ref 26.6–33.0)
MCHC: 33.8 g/dL (ref 31.5–35.7)
MCV: 95 fL (ref 79–97)
Monocytes Absolute: 0.8 10*3/uL (ref 0.1–0.9)
Monocytes: 13 %
Neutrophils Absolute: 4.4 10*3/uL (ref 1.4–7.0)
Neutrophils: 67 %
Platelets: 288 10*3/uL (ref 150–450)
RBC: 4.75 x10E6/uL (ref 4.14–5.80)
RDW: 13 % (ref 11.6–15.4)
WBC: 6.5 10*3/uL (ref 3.4–10.8)

## 2020-07-19 LAB — COMPREHENSIVE METABOLIC PANEL
ALT: 23 IU/L (ref 0–44)
AST: 33 IU/L (ref 0–40)
Albumin/Globulin Ratio: 1.5 (ref 1.2–2.2)
Albumin: 4.7 g/dL (ref 3.7–4.7)
Alkaline Phosphatase: 67 IU/L (ref 44–121)
BUN/Creatinine Ratio: 12 (ref 10–24)
BUN: 14 mg/dL (ref 8–27)
Bilirubin Total: 0.4 mg/dL (ref 0.0–1.2)
CO2: 20 mmol/L (ref 20–29)
Calcium: 10.4 mg/dL — ABNORMAL HIGH (ref 8.6–10.2)
Chloride: 104 mmol/L (ref 96–106)
Creatinine, Ser: 1.2 mg/dL (ref 0.76–1.27)
Globulin, Total: 3.1 g/dL (ref 1.5–4.5)
Glucose: 78 mg/dL (ref 65–99)
Potassium: 4.9 mmol/L (ref 3.5–5.2)
Sodium: 142 mmol/L (ref 134–144)
Total Protein: 7.8 g/dL (ref 6.0–8.5)
eGFR: 63 mL/min/{1.73_m2} (ref >59)

## 2020-07-19 LAB — LIPID PANEL
Chol/HDL Ratio: 4.1 ratio (ref 0.0–5.0)
Cholesterol, Total: 188 mg/dL (ref 100–199)
HDL: 46 mg/dL (ref >39)
LDL Chol Calc (NIH): 102 mg/dL — ABNORMAL HIGH (ref 0–99)
Triglycerides: 236 mg/dL — ABNORMAL HIGH (ref 0–149)
VLDL Cholesterol Cal: 40 mg/dL (ref 5–40)

## 2020-10-09 ENCOUNTER — Telehealth: Payer: Self-pay | Admitting: Family Medicine

## 2020-10-09 NOTE — Telephone Encounter (Signed)
Pt called for samples of Livalo. Please call pt at [phone].

## 2020-10-09 NOTE — Telephone Encounter (Signed)
Pt samples of livalo  were placed up front for pick up [facility]

## 2020-11-20 ENCOUNTER — Telehealth: Payer: Self-pay | Admitting: Family Medicine

## 2020-11-20 NOTE — Telephone Encounter (Signed)
Pt called and said he has not taken his Livalo in about a week and his leg cramps have actually gotten better. He wants to know if you wanted him to continue taking it or stay off of it for now.

## 2020-11-20 NOTE — Telephone Encounter (Signed)
Pt advised and scheduled.

## 2020-12-26 ENCOUNTER — Ambulatory Visit (INDEPENDENT_AMBULATORY_CARE_PROVIDER_SITE_OTHER): Payer: Medicare Other | Admitting: Family Medicine

## 2020-12-26 ENCOUNTER — Other Ambulatory Visit: Payer: Self-pay

## 2020-12-26 ENCOUNTER — Encounter: Payer: Self-pay | Admitting: Family Medicine

## 2020-12-26 VITALS — BP 138/72 | HR 71 | Temp 97.0°F | Wt 156.6 lb

## 2020-12-26 DIAGNOSIS — M79651 Pain in right thigh: Secondary | ICD-10-CM

## 2020-12-26 MED ORDER — ALPRAZOLAM 0.5 MG PO TABS
0.5000 mg | ORAL_TABLET | Freq: Every evening | ORAL | 1 refills | Status: DC | PRN
Start: 2020-12-26 — End: 2021-11-24

## 2020-12-26 NOTE — Progress Notes (Signed)
   Subjective:    Patient ID: Matthew Moreno, male    DOB: Jul 30, 1945, 75 y.o.   MRN: [id number]  HPI He states that he has had a right calf pain for about a year but notes now that the pain is more in his right posterior thigh.  He gets worse with physical activity.  He does not have pain in the back or hip area or in the knee.  He states that walking causes no difficulty.  He does take Tylenol and get good relief of his symptoms.  He would also like a renewal of his Xanax.  He uses this very sparingly for anxiety.   Review of Systems     Objective:   Physical Exam Alert and in no distress.  Full motion of the hip and knee without pain.  No palpable tenderness to the calf or the thigh area.  Pulses appear normal.       Assessment & Plan:  Pain of right thigh I explained that I could not find a reason for his thigh pain and since he is getting good relief with Tylenol, continue this.  Discussed to the fact that if anything changes, let me know because at this point I see no good etiology for this.  He expressed understanding of this.

## 2021-02-13 ENCOUNTER — Ambulatory Visit (INDEPENDENT_AMBULATORY_CARE_PROVIDER_SITE_OTHER): Payer: Medicare Other | Admitting: Family Medicine

## 2021-02-13 ENCOUNTER — Other Ambulatory Visit: Payer: Self-pay

## 2021-02-13 VITALS — BP 142/80 | HR 81 | Temp 98.1°F | Wt 154.2 lb

## 2021-02-13 DIAGNOSIS — K121 Other forms of stomatitis: Secondary | ICD-10-CM | POA: Diagnosis not present

## 2021-02-13 MED ORDER — MAGIC MOUTHWASH: 5.0000 mL | Freq: Three times a day (TID) | ORAL | 0 refills | Status: DC | PRN

## 2021-02-13 NOTE — Progress Notes (Signed)
   Subjective:    Patient ID: Matthew Moreno, male    DOB: 1945/10/20, 75 y.o.   MRN: [id number]  HPI He complains of a 2-week history of redness and discomfort in the posterior throat area.  He states that it similar to when he had radiation for his throat cancer.   Review of Systems     Objective:   Physical Exam Exam of the throat does show erythematous patches on the soft palate but none anywhere else in the mouth.  Teeth appear normal.  Neck is supple without adenopathy or thyromegaly.  KOH was negative.       Assessment & Plan:  Stomatitis - Plan: magic mouthwash SOLN I will try him on Magic mouthwash and if no improvement, then we will need to get him in with his dentist or possibly an oral surgeon for another opinion.

## 2021-02-14 MED ORDER — MAGIC MOUTHWASH: 5.0000 mL | Freq: Three times a day (TID) | ORAL | 0 refills | Status: DC | PRN

## 2021-02-14 NOTE — Addendum Note (Signed)
Addended by: Denita Lung on: 02/14/2021 05:13 PM   Modules accepted: Orders

## 2021-02-17 ENCOUNTER — Telehealth: Payer: Self-pay | Admitting: Family Medicine

## 2021-02-17 NOTE — Telephone Encounter (Signed)
Pt called and said rx was to be called in last Friday and he waiting all weekend nothing there. Reviewed rx for mouthwash and it was set to print.  Called [facility] left message regarding refill and ask that they confirm receipt.

## 2021-02-17 NOTE — Telephone Encounter (Signed)
Magic mouthwas Rx from 9/30 did not go to pharmacy, it was showing as Print  Pt at pharmacy, called in Magic mouthwash to [facility]

## 2021-03-03 ENCOUNTER — Telehealth: Payer: Self-pay | Admitting: Family Medicine

## 2021-03-03 NOTE — Telephone Encounter (Signed)
Pt called and wanted you to be aware that he was seen by his dentist and he referred him to an oral surgeon. He is scheduled for 11/21

## 2021-03-05 ENCOUNTER — Other Ambulatory Visit (INDEPENDENT_AMBULATORY_CARE_PROVIDER_SITE_OTHER): Payer: Medicare Other

## 2021-03-05 ENCOUNTER — Other Ambulatory Visit: Payer: Self-pay

## 2021-03-05 DIAGNOSIS — Z23 Encounter for immunization: Secondary | ICD-10-CM

## 2021-03-20 ENCOUNTER — Ambulatory Visit (INDEPENDENT_AMBULATORY_CARE_PROVIDER_SITE_OTHER): Payer: Medicare Other

## 2021-03-20 ENCOUNTER — Other Ambulatory Visit: Payer: Self-pay

## 2021-03-20 DIAGNOSIS — Z23 Encounter for immunization: Secondary | ICD-10-CM | POA: Diagnosis not present

## 2021-04-17 ENCOUNTER — Ambulatory Visit: Payer: Medicare Other | Admitting: Internal Medicine

## 2021-04-17 ENCOUNTER — Other Ambulatory Visit: Payer: Self-pay

## 2021-04-17 ENCOUNTER — Encounter: Payer: Self-pay | Admitting: Internal Medicine

## 2021-04-17 ENCOUNTER — Telehealth: Payer: Self-pay | Admitting: Family Medicine

## 2021-04-17 VITALS — BP 154/72 | HR 67 | Ht 66.0 in | Wt 158.6 lb

## 2021-04-17 DIAGNOSIS — E785 Hyperlipidemia, unspecified: Secondary | ICD-10-CM | POA: Diagnosis not present

## 2021-04-17 DIAGNOSIS — T466X5D Adverse effect of antihyperlipidemic and antiarteriosclerotic drugs, subsequent encounter: Secondary | ICD-10-CM

## 2021-04-17 DIAGNOSIS — T466X5A Adverse effect of antihyperlipidemic and antiarteriosclerotic drugs, initial encounter: Secondary | ICD-10-CM

## 2021-04-17 DIAGNOSIS — I10 Essential (primary) hypertension: Secondary | ICD-10-CM | POA: Diagnosis not present

## 2021-04-17 DIAGNOSIS — I2510 Atherosclerotic heart disease of native coronary artery without angina pectoris: Secondary | ICD-10-CM | POA: Diagnosis not present

## 2021-04-17 DIAGNOSIS — G720 Drug-induced myopathy: Secondary | ICD-10-CM

## 2021-04-17 NOTE — Patient Instructions (Signed)
Medication Instructions:  NO CHANGES *If you need a refill on your cardiac medications before your next appointment, please call your pharmacy*   Lab Work: FASTING lab work to check cholesterol today   If you have labs (blood work) drawn today and your tests are completely normal, you will receive your results only by: [place] (if you have MyChart) OR A paper copy in the mail If you have any lab test that is abnormal or we need to change your treatment, we will call you to review the results.   Testing/Procedures: NONE   Follow-Up: At [facility], you and your health needs are our priority.  As part of our continuing mission to provide you with exceptional heart care, we have created designated Provider Care Teams.  These Care Teams include your primary Cardiologist (physician) and Advanced Practice Providers (APPs -  Physician Assistants and Nurse Practitioners) who all work together to provide you with the care you need, when you need it.  We recommend signing up for the patient portal called "MyChart".  Sign up information is provided on this After Visit Summary.  MyChart is used to connect with patients for Virtual Visits (Telemedicine).  Patients are able to view lab/test results, encounter notes, upcoming appointments, etc.  Non-urgent messages can be sent to your provider as well.   To learn more about what you can do with MyChart, go to [email or web address].    Your next appointment:   12 month(s)  The format for your next appointment:   In Person  Provider:   Dr. Debara Pickett

## 2021-04-17 NOTE — Telephone Encounter (Signed)
Pt advised. KH 

## 2021-04-17 NOTE — Progress Notes (Signed)
OFFICE NOTE  Chief Complaint:  No complaints  Primary Care Physician: Matthew Lung, MD  HPI:  Matthew Moreno is a 75 year old gentleman we are following for history of coronary disease, moderate, in 2010 on catheterization, 50% LAD, 40% RCA and 50% OM lesion. He also has hypertension difficult to control, dyslipidemia and intolerance to statins. He has returned today with worsening leg pain which now he is attributing to fenofibrate. His most recent lipid profile obtained on April 11, 2012, showed total cholesterol 241, triglycerides 217, LDL was 158, HDL 40. His triglycerides are actually down from about 400 last year with the fenofibrate so that seems to be working. Unfortunately, he was directed to take Livalo 2 mg daily last year at his last visit but did not start that and is now contemplating discontinuing the fenofibrate as he is concerned this is causing his muscle pain. Since his last visit, he has stopped both the little and fenofibrate. Prior to his last blood work, he went out with some friends and was drinking bourbon in his triglycerides were 1400. Therefore difficult to know what his LDL actually is without measuring a direct LDL. We discussed options as alternatives to statins including WelChol and his idea, however he is not interested in any further medications.  Matthew Moreno has no new complaints today. 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 He is undergone radiation therapy and is apparently clear of this. Unfortunately he was intolerant to the statins and cannot afford to take fenofibrate. He is currently not on any therapy for lipid lowering and does have moderate coronary artery disease. We have discussed this in the past and felt there were a few additional options. He does say that he lost 20 or 30 pounds recently due to his cancer and wonders if his cholesterol is improved.  01/10/2016  Matthew Moreno returns today for follow-up. He seems to  be doing fairly well although recently he's had some worsening shortness of breath and fatigue. This is mostly with exertion. 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 Several days he's had to go back home and go to sleep for the rest of the day. He does not associate this with the hot weather. He denies any chest pain with this but has been more fatigued recently. He is concern is a friend of his had some similar symptoms and underwent a bypass surgery. [name] does have a history of moderate coronary artery disease by catheterization in 2010. He's had difficult to control cholesterol due to intolerance to statins but has been able to take Livalo. Recent lab work indicated much higher triglycerides been previously, in fact greater than 400 therefore LDL was not calculated, however LDL last year was 102.  01/11/2017  Matthew Moreno is seen today in follow-up. Overall he is asymptomatic. He was having some shortness of breath but that improved. We did a stress test last year which was negative for ischemia. He's gained about 15 pounds since we last saw him, he feels that he's eating last however gaining weight. He denies any swelling. He's not had any significant muscle mass. He generally eats a large "country" breakfast, usually a sandwich for lunch and very little if anything for dinner. He's had elevated triglycerides in the past and an LDL over 100. I started him on Livalo which she is tolerating, but is not made a significant improvement in triglycerides rather his LDL was lower in the  60s. Fasting blood glucose is been in the low 100s in the past but he is not known to be diabetic.  01/12/2018  Matthew Moreno is seen today in follow-up.  Overall he continues to be asymptomatic.  He denies any chest pain or worsening shortness of breath.  Weight has been stable.  His last lipid profile 9 months ago by his PCP was not a goal showing total cholesterol 221, HDL 42,  triglycerides 345 and LDL 130.  Non-HDL cholesterol significantly elevated at 180 suggesting significant residual risk.  01/24/2019  Matthew Moreno is seen today in follow-up.  He denies any chest pain or worsening shortness of breath.  Blood pressure was elevated today 164/84 and a recheck came down to 142/74.  He says at home his blood pressure ranges from the low 130s up to 150s consistently.  He is only on half tablet of the metoprolol.  He does not think that that is making a significant effect on his blood pressure.  Cholesterol has not been reassessed recently and he is overdue for that.  He did have triglycerides checked in April which were elevated and remain persistently elevated.  He is possibly a good candidate for Vascepa about which we discussed today.  01/25/2020  Matthew Moreno is seen today in follow-up.  Overall he is feeling well.  However this past month was not so good.  He went on a trip apparently out to [place] with some friends and somebody had COVID-19.  He has been vaccinated fully with his last shot in April of the [employer] vaccine but did ultimately contract COVID-19.  He said he had 1 to 2 days of fever and ultimately felt that it might be going to his lungs and therefore got monoclonal antibody therapy through the [facility] outpatient clinic.  This worked almost immediately and he said he felt much better.  He is now out of quarantine.  Otherwise he is asymptomatic.  He did have lab work in February 2021 with his PCP showing total cholesterol 163, HDL 39, LDL 89 and triglycerides 207.  04/17/2021  Matthew Moreno returns today for follow-up.  Overall he says he feels well denies any chest pain or worsening shortness of breath.  Blood pressure was elevated today however he said did not take his blood pressure medicines this morning.  He reports that he had recently stopped taking his Livalo, probably earlier this year.  He had lipids in March which showed total cholesterol of 188, HDL 46, LDL  102 and triglycerides 236, this is higher than ideal.  He then said that they actually decrease the dose to 2 mg from 4 mg and has been compliant with that dose, noting that he has had improvement in some leg pain that he was having.  He has not had his cholesterol reassessed since making that dose change.  PMHx:  Past Medical History:  Diagnosis Date   CAD (coronary artery disease) 2008   Dyslipidemia    Dysphagia    Family history of heart disease    Hypertension    Nephrolithiasis    H/O multiple "kidney stones", Lithotripsies, J-Stents   Panic attacks    S/P radiation therapy 02/12/2014-04/02/2014   Left tonsil and bilateral neck / 70 Gy in 35 fractions to gross disease, 63 Gy in 35 fractions to high risk nodalechelons, and 56 Gy in 35 fractions to intermediate risk nodal echelons    Statin intolerance    Tonsil cancer ([place]) 01/05/14   left  Past Surgical History:  Procedure Laterality Date   BICEPS TENDON REPAIR Left    CARDIAC CATHETERIZATION  06/19/2008   normal left main, Cfx with 3 OMs (50% osital narrowing in OM1), LAD densely calcified in the proximal half of vessel with eccentric narrowing in mid region (40-50% severity), diag 1 & 2 free of disease, RCA with less than 30% narrowing in midportion (Dr. Rockne Menghini)   COLONOSCOPY  2010   Dr. Ardis Hughs   ESOPHAGOGASTRODUODENOSCOPY N/A 09/04/2018   Procedure: ESOPHAGOGASTRODUODENOSCOPY (EGD);  Surgeon: Carol Ada, MD;  Location: [facility];  Service: Endoscopy;  Laterality: N/A;   EYE SURGERY Bilateral    Cataract   FOREIGN BODY REMOVAL  09/04/2018   Procedure: FOREIGN BODY REMOVAL;  Surgeon: Carol Ada, MD;  Location[facility];  Service: Endoscopy;;    FAMHx:  Family History  Problem Relation Age of Onset   Heart Problems Mother    Heart Problems Father     SOCHx:   reports that he quit smoking about 37 years ago. His smoking use included cigarettes. He has a 15.00 pack-year smoking history. He has never used  smokeless tobacco. He reports current alcohol use of about 3.0 standard drinks per week. He reports that he does not use drugs.  ALLERGIES:  Allergies  Allergen Reactions   Bystolic [Nebivolol Hcl]    Lipitor [Atorvastatin] Other (See Comments)    Muscle aches with statins   Lisinopril    Pravastatin    Red Yeast Rice [Cholestin]    Sulfonamide Derivatives     REACTION: hives   Zocor [Simvastatin]     ROS: Pertinent items noted in HPI and remainder of comprehensive ROS otherwise negative.  HOME MEDS: Current Outpatient Medications  Medication Sig Dispense Refill   albuterol (VENTOLIN HFA) 108 (90 Base) MCG/ACT inhaler Inhale 2 puffs into the lungs every 6 (six) hours as needed for wheezing or shortness of breath.     ALPRAZolam (XANAX) 0.5 MG tablet Take 1 tablet (0.5 mg total) by mouth at bedtime as needed. 30 tablet 1   amLODipine (NORVASC) 5 MG tablet TAKE 1 TABLET(5 MG) BY MOUTH DAILY 90 tablet 3   aspirin 81 MG tablet Take 81 mg by mouth daily.     calcium carbonate (TUMS EX) 750 MG chewable tablet Chew 1 tablet by mouth daily.     fenofibrate 160 MG tablet Take 1 tablet (160 mg total) by mouth daily. 90 tablet 3   magic mouthwash SOLN Take 5 mLs by mouth 3 (three) times daily as needed for mouth pain. 150 mL 0   metoprolol tartrate (LOPRESSOR) 25 MG tablet TAKE 1/2 TABLET(12.5 MG) BY MOUTH TWICE DAILY 90 tablet 3   omeprazole (PRILOSEC) 20 MG capsule Take 20 mg by mouth daily.     Pitavastatin Calcium (LIVALO) 4 MG TABS Take 1 tablet (4 mg total) by mouth daily. 90 tablet 3   sodium fluoride (FLUORISHIELD) 1.1 % GEL dental gel Instill 1 drop into each tooth space of fluoride tray. Place over teeth for 5 minutes. Remove. Spit out excess. Repeat nightly. 120 mL prn   No current facility-administered medications for this visit.   Facility-Administered Medications Ordered in Other Visits  Medication Dose Route Frequency Provider Last Rate Last Admin   influenza  inactive virus  vaccine (FLUZONE/FLUARIX) injection 0.5 mL  0.5 mL Intramuscular Once Matthew Lung, MD        LABS/IMAGING: No results found for this or any previous visit (from the past 48 hour(s)).  No results found.  VITALS: BP (!) 154/72   Pulse 67   Ht 5\' 6"  (1.676 m)   Wt 158 lb 9.6 oz (71.9 kg)   SpO2 99%   BMI 25.60 kg/m   EXAM: General appearance: alert and no distress Neck: no carotid bruit, no JVD and thyroid not enlarged, symmetric, no tenderness/mass/nodules Lungs: clear to auscultation bilaterally Heart: regular rate and rhythm, S1, S2 normal, no murmur, click, rub or gallop Abdomen: soft, non-tender; bowel sounds normal; no masses,  no organomegaly Extremities: extremities normal, atraumatic, no cyanosis or edema Pulses: 2+ and symmetric Skin: Skin color, texture, turgor normal. No rashes or lesions Neurologic: Grossly normal Psych: Pleasant  EKG: Normal sinus rhythm at 67, voltage criteria for LVH-personally reviewed  ASSESSMENT: Moderate nonobstructive coronary artery disease (2010 cath) Hypertension Dyslipidemia - on Livalo Recent tonsillar cancer DOE - LVEF 60-65%, mild AI (no discernible murmur)  PLAN: 1.   Mr. Sadowsky seems to be doing well without any symptoms.  He denies shortness of breath or chest pain.  Blood pressure is a little elevated today however he did not take his medicines.  His cholesterol has been above target but the time either stopped or decreased the dose of his medicine.  He says has been compliant with the 2 mg Livalo dose since early this spring.  We will plan to recheck his lipids today.  Total goal LDL less than 70.  This may necessitate adding ezetimibe however he seemed somewhat resistant to additional medication.  Follow-up with me annually or sooner as necessary.  Pixie Casino, MD, [facility], [place] Director of the Advanced Lipid Disorders &  Cardiovascular Risk Reduction Clinic Diplomate of the  American Board of Clinical Lipidology Attending Cardiologist  Direct Dial: [phone]  Fax: [phone]  Website:  [email or web address] Nikola Blackston 04/17/2021, 9:30 AM

## 2021-04-17 NOTE — Telephone Encounter (Signed)
Pt called for samples of Livalo 4 mg. Please call pt at [phone] when ready.

## 2021-04-18 LAB — LIPID PANEL
Chol/HDL Ratio: 5.1 ratio — ABNORMAL HIGH (ref 0.0–5.0)
Cholesterol, Total: 215 mg/dL — ABNORMAL HIGH (ref 100–199)
HDL: 42 mg/dL (ref >39)
LDL Chol Calc (NIH): 132 mg/dL — ABNORMAL HIGH (ref 0–99)
Triglycerides: 228 mg/dL — ABNORMAL HIGH (ref 0–149)
VLDL Cholesterol Cal: 41 mg/dL — ABNORMAL HIGH (ref 5–40)

## 2021-04-21 ENCOUNTER — Telehealth: Payer: Self-pay

## 2021-04-21 MED ORDER — ALBUTEROL SULFATE HFA 108 (90 BASE) MCG/ACT IN AERS: 2.0000 | INHALATION_SPRAY | Freq: Four times a day (QID) | RESPIRATORY_TRACT | 0 refills | Status: DC | PRN

## 2021-04-21 NOTE — Telephone Encounter (Signed)
Pt. Called wanting a refill on his Albuterol inhaler because he has come down with the flu and the inhaler he has is about 3 years out of date. He uses [facility] on [address].

## 2021-04-28 ENCOUNTER — Telehealth: Payer: Self-pay | Admitting: Internal Medicine

## 2021-04-28 DIAGNOSIS — E785 Hyperlipidemia, unspecified: Secondary | ICD-10-CM

## 2021-04-28 MED ORDER — EZETIMIBE 10 MG PO TABS: 10.0000 mg | ORAL_TABLET | Freq: Every day | ORAL | 11 refills | Status: DC

## 2021-04-28 NOTE — Telephone Encounter (Signed)
Cholesterol is not well controlled.  Advise adding ezetimibe 10 mg daily to current regimen and continue Livalo 2 mg daily and fenofibrate 160 mg daily.  Repeat lipid in 3 months.   Dr. Lemmie Evens Patient in agreement to starting new medication and lab work in 3 months.  Sent in prescription.  Ordered lab to be draw in early March.   Verbalized agreement and understanding.

## 2021-04-28 NOTE — Telephone Encounter (Signed)
Patient wanted to talk to Dr. Lysbeth Penner Nurse to follow up about  adding a new medication to the patient's medicine list. The patient said he spoke to Dr. Debara Pickett about it at his last appointment

## 2021-04-29 ENCOUNTER — Other Ambulatory Visit: Payer: Self-pay | Admitting: Family Medicine

## 2021-04-29 DIAGNOSIS — I2510 Atherosclerotic heart disease of native coronary artery without angina pectoris: Secondary | ICD-10-CM

## 2021-04-29 DIAGNOSIS — I10 Essential (primary) hypertension: Secondary | ICD-10-CM

## 2021-05-02 ENCOUNTER — Telehealth: Payer: Self-pay | Admitting: Family Medicine

## 2021-05-02 NOTE — Telephone Encounter (Signed)
Pt called for samples of Livalo. Please call pt at [phone] to advise.

## 2021-05-05 NOTE — Telephone Encounter (Signed)
Pt. Called back to see if we had any Livalo samples I told him we did not. I told him I would send you a message to see if you could get in any of those samples.

## 2021-05-11 ENCOUNTER — Other Ambulatory Visit: Payer: Self-pay | Admitting: Family Medicine

## 2021-05-13 NOTE — Telephone Encounter (Signed)
[facility] is requesting to fill pt albuterol. Please advise [facility]

## 2021-05-16 NOTE — Telephone Encounter (Signed)
I faxed another order for samples of Livalo and sent pt mychart message

## 2021-06-04 ENCOUNTER — Telehealth: Payer: Self-pay

## 2021-06-04 NOTE — Telephone Encounter (Signed)
Advised pt samples of Livalo came in

## 2021-06-05 NOTE — Telephone Encounter (Signed)
Pt was given samples livalo

## 2021-07-22 DIAGNOSIS — E785 Hyperlipidemia, unspecified: Secondary | ICD-10-CM | POA: Diagnosis not present

## 2021-07-22 LAB — LIPID PANEL
Chol/HDL Ratio: 4.2 ratio (ref 0.0–5.0)
Cholesterol, Total: 163 mg/dL (ref 100–199)
HDL: 39 mg/dL — ABNORMAL LOW (ref >39)
LDL Chol Calc (NIH): 79 mg/dL (ref 0–99)
Triglycerides: 272 mg/dL — ABNORMAL HIGH (ref 0–149)
VLDL Cholesterol Cal: 45 mg/dL — ABNORMAL HIGH (ref 5–40)

## 2021-07-24 ENCOUNTER — Other Ambulatory Visit: Payer: Self-pay | Admitting: Family Medicine

## 2021-07-24 DIAGNOSIS — E781 Pure hyperglyceridemia: Secondary | ICD-10-CM

## 2021-07-25 ENCOUNTER — Other Ambulatory Visit: Payer: Self-pay | Admitting: Family Medicine

## 2021-07-25 DIAGNOSIS — I10 Essential (primary) hypertension: Secondary | ICD-10-CM

## 2021-07-25 DIAGNOSIS — I2510 Atherosclerotic heart disease of native coronary artery without angina pectoris: Secondary | ICD-10-CM

## 2021-08-01 ENCOUNTER — Ambulatory Visit (INDEPENDENT_AMBULATORY_CARE_PROVIDER_SITE_OTHER): Payer: Medicare Other | Admitting: Medical

## 2021-08-01 ENCOUNTER — Other Ambulatory Visit: Payer: Self-pay

## 2021-08-01 VITALS — BP 120/70 | HR 60 | Temp 97.2°F | Wt 161.2 lb

## 2021-08-01 DIAGNOSIS — R300 Dysuria: Secondary | ICD-10-CM | POA: Diagnosis not present

## 2021-08-01 DIAGNOSIS — R310 Gross hematuria: Secondary | ICD-10-CM

## 2021-08-01 DIAGNOSIS — Z87442 Personal history of urinary calculi: Secondary | ICD-10-CM | POA: Diagnosis not present

## 2021-08-01 LAB — POCT URINALYSIS DIP (PROADVANTAGE DEVICE)
Bilirubin, UA: NEGATIVE
Glucose, UA: NEGATIVE mg/dL
Ketones, POC UA: NEGATIVE mg/dL
Leukocytes, UA: NEGATIVE
Nitrite, UA: NEGATIVE
Protein Ur, POC: NEGATIVE mg/dL
Specific Gravity, Urine: 1.025
Urobilinogen, Ur: NEGATIVE
pH, UA: 6 (ref 5.0–8.0)

## 2021-08-01 MED ORDER — CIPROFLOXACIN HCL 500 MG PO TABS: 500.0000 mg | ORAL_TABLET | Freq: Two times a day (BID) | ORAL | 0 refills | Status: AC

## 2021-08-01 MED ORDER — HYDROCODONE-ACETAMINOPHEN 5-325 MG PO TABS: 1.0000 | ORAL_TABLET | Freq: Four times a day (QID) | ORAL | 0 refills | Status: DC | PRN

## 2021-08-01 NOTE — Progress Notes (Signed)
Subjective: ?  Matthew Moreno is a 76 y.o. male who complains of possible urinary tract infection vs kidney stone.  Symptoms began 3 days ago, and has had burning with urination, obvious blood in urine, no back or belly pain, but pain mostly in penis.  No testicle pain or swelling.  No fever, no nausea, no vomiting, no diarrhea.   Last UTI was never.  Has had several kidney stones prior though.  Last kidney stone was 14 years ago.   Using nothing for current symptoms.   No concern for STD.  Not sexually active.   Drinks some water, not a lot.  No history of prostate infection. ? ?Patient does not have a history of recurrent UTI. Patient does not have a history of pyelonephritis.   ? ?No other aggravating or relieving factors.  No other c/o. ? ?Past Medical History:  ?Diagnosis Date  ? CAD (coronary artery disease) 2008  ? Dyslipidemia   ? Dysphagia   ? Family history of heart disease   ? Hypertension   ? Nephrolithiasis   ? H/O multiple "kidney stones", Lithotripsies, J-Stents  ? Panic attacks   ? S/P radiation therapy 02/12/2014-04/02/2014  ? Left tonsil and bilateral neck / 70 Gy in 35 fractions to gross disease, 63 Gy in 35 fractions to high risk nodalechelons, and 56 Gy in 35 fractions to intermediate risk nodal echelons   ? Statin intolerance   ? 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 ? ?Current Outpatient Medications on File Prior to Visit  ?Medication Sig Dispense Refill  ? ALPRAZolam (XANAX) 0.5 MG tablet Take 1 tablet (0.5 mg total) by mouth at bedtime as needed. 30 tablet 1  ? amLODipine (NORVASC) 5 MG tablet TAKE 1 TABLET(5 MG) BY MOUTH DAILY 90 tablet 3  ? aspirin 81 MG tablet Take 81 mg by mouth daily.    ? calcium carbonate (TUMS EX) 750 MG chewable tablet Chew 1 tablet by mouth daily.    ? ezetimibe (ZETIA) 10 MG tablet Take 1 tablet (10 mg total) by mouth daily. 30 tablet 11  ? fenofibrate 160 MG tablet TAKE 1 TABLET(160 MG) BY MOUTH DAILY 90 tablet 1  ? metoprolol tartrate (LOPRESSOR) 25 MG tablet  TAKE 1/2 TABLET(12.5 MG) BY MOUTH TWICE DAILY 90 tablet 0  ? omeprazole (PRILOSEC) 20 MG capsule Take 20 mg by mouth daily.    ? sodium fluoride (FLUORISHIELD) 1.1 % GEL dental gel Instill 1 drop into each tooth space of fluoride tray. Place over teeth for 5 minutes. Remove. Spit out excess. Repeat nightly. 120 mL prn  ? VENTOLIN HFA 108 (90 Base) MCG/ACT inhaler INHALE 2 PUFFS INTO THE LUNGS EVERY 6 HOURS AS NEEDED FOR WHEEZING OR SHORTNESS OF BREATH 18 g 0  ? [DISCONTINUED] Pitavastatin Calcium 4 MG TABS Take 2 mg by mouth daily.    ? ?Current Facility-Administered Medications on File Prior to Visit  ?Medication Dose Route Frequency Provider Last Rate Last Admin  ? influenza  inactive virus vaccine (FLUZONE/FLUARIX) injection 0.5 mL  0.5 mL Intramuscular Once Denita Lung, MD      ? ?Family History  ?Problem Relation Age of Onset  ? Heart Problems Mother   ? Heart Problems Father   ? ? ? ?ROS as in subjective ? ?Reviewed allergies, medications, past medical, surgical, and social history.  ? ?  ?Objective: ?BP 120/70   Pulse 60   Temp (!) 97.2 ?F (36.2 ?C)   Wt 161 lb 3.2  oz (73.1 kg)   BMI 26.02 kg/m?  ? ? ?General appearance: alert, no distress, WD/WN, male ?Abdomen: +bs, soft, mild right lower quadrant tenderness, otherwise non tender, non distended, no masses, no hepatomegaly, no splenomegaly, no bruits ?Back: no CVA tenderness ?GU: normal male genitalia, no mass, no hernia, no testicular tendnerss or swelling, no lymphadenopathy ?   ? ? ? ?Assessment: ?Encounter Diagnoses  ?Name Primary?  ? Gross hematuria Yes  ? Dysuria   ? History of renal stone   ? ? ? ?Plan: ?We discussed symptoms and concerns, possible differential which could include prostatitis, urinary tract infection, renal stone, tumor, or other. ? ?Urine sent for culture.  Urine microscopic shows 1 RBC per field, otherwise unremarkable ? ?Begin Cipro below for suspected infection.  Pain medicine below as needed for pain.  Hydrate well,  increase water intake, await culture.  If much worse over the weekend go to the emergency department ? ?Owyn was seen today for possible uti. ? ?Diagnoses and all orders for this visit: ? ?Gross hematuria ?-     POCT Urinalysis DIP (Proadvantage Device) ?-     Urine Culture ? ?Dysuria ?-     POCT Urinalysis DIP (Proadvantage Device) ?-     Urine Culture ? ?History of renal stone ?-     POCT Urinalysis DIP (Proadvantage Device) ?-     Urine Culture ? ?Other orders ?-     ciprofloxacin (CIPRO) 500 MG tablet; Take 1 tablet (500 mg total) by mouth 2 (two) times daily for 3 days. ?-     HYDROcodone-acetaminophen (NORCO) 5-325 MG tablet; Take 1 tablet by mouth every 6 (six) hours as needed. ? ? ? ?Call or return if worse or not improving in the next 3-4 days.   ?   ?

## 2021-08-05 LAB — URINE CULTURE

## 2021-08-06 ENCOUNTER — Other Ambulatory Visit: Payer: Self-pay | Admitting: Medical

## 2021-08-06 MED ORDER — AMOXICILLIN 875 MG PO TABS: 875.0000 mg | ORAL_TABLET | Freq: Two times a day (BID) | ORAL | 0 refills | Status: DC

## 2021-08-07 ENCOUNTER — Telehealth: Payer: Self-pay

## 2021-08-07 NOTE — Telephone Encounter (Signed)
Pt. Called wanting to get a message to you that he passed a kidney stone yesterday so everything is all good now.  ?

## 2021-10-10 ENCOUNTER — Other Ambulatory Visit: Payer: Self-pay | Admitting: Family Medicine

## 2021-10-10 DIAGNOSIS — I10 Essential (primary) hypertension: Secondary | ICD-10-CM

## 2021-10-21 ENCOUNTER — Telehealth: Payer: Self-pay | Admitting: Family Medicine

## 2021-10-21 NOTE — Telephone Encounter (Signed)
Rydan is asking if he can get more samples of livalo '4mg'$ . His chart shows its been discontinued.

## 2021-10-22 NOTE — Telephone Encounter (Signed)
Lvm for pt to come by and pick up.

## 2021-11-04 ENCOUNTER — Telehealth: Payer: Self-pay | Admitting: Family Medicine

## 2021-11-04 NOTE — Telephone Encounter (Signed)
Left message for patient to call back and schedule Medicare Annual Wellness Visit (AWV) either virtually or in office. Left  my Herbie Drape number [phone]   Last AWV ;07/13/19  please schedule at anytime with [facility] Nurse Health Advisor 1 or 2  I wanted to see if patient would schedule AWV with Pamala Hurry prior to his appt with Dr Redmond School on 11/24/21

## 2021-11-07 ENCOUNTER — Ambulatory Visit (INDEPENDENT_AMBULATORY_CARE_PROVIDER_SITE_OTHER): Payer: Medicare Other

## 2021-11-07 VITALS — BP 128/70 | HR 67 | Temp 97.6°F | Ht 66.5 in | Wt 159.8 lb

## 2021-11-07 DIAGNOSIS — Z0000 Encounter for general adult medical examination without abnormal findings: Secondary | ICD-10-CM

## 2021-11-11 ENCOUNTER — Other Ambulatory Visit: Payer: Self-pay | Admitting: Family Medicine

## 2021-11-11 DIAGNOSIS — I2510 Atherosclerotic heart disease of native coronary artery without angina pectoris: Secondary | ICD-10-CM

## 2021-11-11 DIAGNOSIS — I10 Essential (primary) hypertension: Secondary | ICD-10-CM

## 2021-11-24 ENCOUNTER — Ambulatory Visit (INDEPENDENT_AMBULATORY_CARE_PROVIDER_SITE_OTHER): Payer: Medicare Other | Admitting: Family Medicine

## 2021-11-24 ENCOUNTER — Encounter: Payer: Self-pay | Admitting: Family Medicine

## 2021-11-24 VITALS — BP 122/70 | HR 80 | Ht 67.0 in | Wt 159.4 lb

## 2021-11-24 DIAGNOSIS — E781 Pure hyperglyceridemia: Secondary | ICD-10-CM

## 2021-11-24 DIAGNOSIS — T466X5A Adverse effect of antihyperlipidemic and antiarteriosclerotic drugs, initial encounter: Secondary | ICD-10-CM

## 2021-11-24 DIAGNOSIS — R0989 Other specified symptoms and signs involving the circulatory and respiratory systems: Secondary | ICD-10-CM

## 2021-11-24 DIAGNOSIS — I2510 Atherosclerotic heart disease of native coronary artery without angina pectoris: Secondary | ICD-10-CM | POA: Diagnosis not present

## 2021-11-24 DIAGNOSIS — K219 Gastro-esophageal reflux disease without esophagitis: Secondary | ICD-10-CM

## 2021-11-24 DIAGNOSIS — R42 Dizziness and giddiness: Secondary | ICD-10-CM

## 2021-11-24 DIAGNOSIS — I10 Essential (primary) hypertension: Secondary | ICD-10-CM

## 2021-11-24 DIAGNOSIS — C099 Malignant neoplasm of tonsil, unspecified: Secondary | ICD-10-CM

## 2021-11-24 DIAGNOSIS — Z9842 Cataract extraction status, left eye: Secondary | ICD-10-CM

## 2021-11-24 DIAGNOSIS — R3914 Feeling of incomplete bladder emptying: Secondary | ICD-10-CM

## 2021-11-24 DIAGNOSIS — M1990 Unspecified osteoarthritis, unspecified site: Secondary | ICD-10-CM

## 2021-11-24 DIAGNOSIS — Z9841 Cataract extraction status, right eye: Secondary | ICD-10-CM

## 2021-11-24 DIAGNOSIS — N1831 Chronic kidney disease, stage 3a: Secondary | ICD-10-CM

## 2021-11-24 DIAGNOSIS — G720 Drug-induced myopathy: Secondary | ICD-10-CM | POA: Diagnosis not present

## 2021-11-24 DIAGNOSIS — Z0000 Encounter for general adult medical examination without abnormal findings: Secondary | ICD-10-CM | POA: Diagnosis not present

## 2021-11-24 DIAGNOSIS — N401 Enlarged prostate with lower urinary tract symptoms: Secondary | ICD-10-CM

## 2021-11-24 DIAGNOSIS — F419 Anxiety disorder, unspecified: Secondary | ICD-10-CM

## 2021-11-24 DIAGNOSIS — Z87442 Personal history of urinary calculi: Secondary | ICD-10-CM

## 2021-11-24 DIAGNOSIS — C4440 Unspecified malignant neoplasm of skin of scalp and neck: Secondary | ICD-10-CM

## 2021-11-24 MED ORDER — METOPROLOL TARTRATE 25 MG PO TABS: ORAL_TABLET | ORAL | 3 refills | Status: DC

## 2021-11-24 MED ORDER — FENOFIBRATE 160 MG PO TABS: ORAL_TABLET | ORAL | 3 refills | Status: DC

## 2021-11-24 MED ORDER — LIVALO 4 MG PO TABS: 1.0000 | ORAL_TABLET | Freq: Every day | ORAL | 3 refills | Status: DC

## 2021-11-24 MED ORDER — EZETIMIBE 10 MG PO TABS: 10.0000 mg | ORAL_TABLET | Freq: Every day | ORAL | 3 refills | Status: DC

## 2021-11-24 MED ORDER — AMLODIPINE BESYLATE 5 MG PO TABS: 5.0000 mg | ORAL_TABLET | Freq: Every day | ORAL | 3 refills | Status: DC

## 2021-11-24 MED ORDER — ALPRAZOLAM 0.5 MG PO TABS: 0.5000 mg | ORAL_TABLET | Freq: Every evening | ORAL | 1 refills | Status: DC | PRN

## 2021-11-24 NOTE — Progress Notes (Signed)
Complete physical exam  Patient: Matthew Moreno   DOB: 18-Jan-1946   76 y.o. Male  MRN: [id number]  Subjective:    Chief Complaint  Patient presents with   fasting cpe     Fasting cpe, sometimes gets lightheaded    Matthew Moreno is a 76 y.o. male who presents today for a complete physical exam. He reports consuming a general diet.  walking  He generally feels well. He reports sleeping fairly well. He states that he has noted some episode of dizziness when he looks down or up.  The dizziness goes away very quickly.  No blurred vision, double vision, nausea, vomiting, weakness.  He still is dealing with intermittent anxiety and notices especially when he lays down at night.  He has been using Xanax but very sparingly.  He does have some evidence of statin intolerance but seems to be doing okay on Livalo as well as fenofibrate and Zetia.  Continues on metoprolol as well as amlodipine. He uses Prilosec on an as-needed basis.  He does follow-up regularly with cardiology and is having no difficulty with chest pain, shortness of breath, PND or DOE.  He does have a lesion present on his forehead that he plans to see dermatology about.  Does have blood work indicating CKD.  He states he keeps his urinary symptoms under control by controlling his fluid intake.  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 Most recent fall risk assessment:    11/07/2021    9:49 AM  Fall Risk   Falls in the past year? 0  Number falls in past yr: 0  Injury with Fall? 0  Risk for fall due to : Medication side effect  Follow up Falls evaluation completed;Education provided;Falls prevention discussed     Most recent depression screenings:    11/07/2021    9:49 AM 08/01/2021    8:26 AM  PHQ 2/9 Scores  PHQ - 2 Score 0 0  PHQ- 9 Score 0         Patient Care Team: Denita Lung, MD as PCP - General (Family Medicine) Eppie Gibson, MD as Attending Physician (Radiation Oncology) Leota Sauers, RN (Inactive) as Oncology Nurse Navigator   Outpatient Medications Prior to Visit  Medication Sig   omeprazole (PRILOSEC) 20 MG capsule Take 20 mg by mouth daily.   sodium fluoride (FLUORISHIELD) 1.1 % GEL dental gel Instill 1 drop into each tooth space of fluoride tray. Place over teeth for 5 minutes. Remove. Spit out excess. Repeat nightly.   VENTOLIN HFA 108 (90 Base) MCG/ACT inhaler INHALE 2 PUFFS INTO THE LUNGS EVERY 6 HOURS AS NEEDED FOR WHEEZING OR SHORTNESS OF BREATH   [DISCONTINUED] ALPRAZolam (XANAX) 0.5 MG tablet Take 1 tablet (0.5 mg total) by mouth at bedtime as needed.   [DISCONTINUED] amLODipine (NORVASC) 5 MG tablet TAKE 1 TABLET(5 MG) BY MOUTH DAILY   [DISCONTINUED] amoxicillin (AMOXIL) 875 MG tablet Take 1 tablet (875 mg total) by mouth 2 (two) times daily.   [DISCONTINUED] ezetimibe (ZETIA) 10 MG tablet Take 1 tablet (10 mg total) by mouth daily.   [DISCONTINUED] fenofibrate 160 MG tablet TAKE 1 TABLET(160 MG) BY MOUTH DAILY   [DISCONTINUED] metoprolol tartrate (LOPRESSOR) 25 MG tablet TAKE 1/2 TABLET(12.5 MG) BY MOUTH TWICE DAILY   [DISCONTINUED] Pitavastatin Calcium (LIVALO) 4 MG TABS Take 1 tablet by mouth daily.   aspirin 81 MG tablet Take 81  mg by mouth daily. (Patient not taking: Reported on 11/24/2021)   [DISCONTINUED] calcium carbonate (TUMS EX) 750 MG chewable tablet Chew 1 tablet by mouth daily.   [DISCONTINUED] HYDROcodone-acetaminophen (NORCO) 5-325 MG tablet Take 1 tablet by mouth every 6 (six) hours as needed. (Patient not taking: Reported on 11/07/2021)   Facility-Administered Medications Prior to Visit  Medication Dose Route Frequency Provider   influenza  inactive virus vaccine (FLUZONE/FLUARIX) injection 0.5 mL  0.5 mL Intramuscular Once Denita Lung, MD    Review of Systems  All other systems reviewed and are negative.         Objective:     BP 122/70   Pulse 80   Ht 5'  7" (1.702 m)   Wt 159 lb 6.4 oz (72.3 kg)   BMI 24.97 kg/m    The 10-year ASCVD risk score (Arnett DK, et al., 2019) is: 26.7%   Values used to calculate the score:     Age: 48 years     Sex: Male     Is Non-Hispanic African American: No     Diabetic: No     Tobacco smoker: No     Systolic Blood Pressure: 496 mmHg     Is BP treated: Yes     HDL Cholesterol: 39 mg/dL     Total Cholesterol: 163 mg/dL   Physical Exam   Alert and in no distress.  1 cm erythematous lesion noted at the mid upper forehead/scalp area.  Bilateral carotid bruits are noted.  Tympanic membranes and canals are normal. Pharyngeal area is normal. Neck is supple, fullness is appreciated in the left submandibular area.  States that this has not changed in the last several years.. Cardiac exam shows a regular sinus rhythm without murmurs or gallops. Lungs are clear to auscultation.     Assessment & Plan:    Routine general medical examination at a health care facility - Plan: CBC with Differential/Platelet, Comprehensive metabolic panel, Lipid panel  Hypertriglyceridemia - Plan: Lipid panel, fenofibrate 160 MG tablet, Pitavastatin Calcium (LIVALO) 4 MG TABS, ezetimibe (ZETIA) 10 MG tablet  Gastroesophageal reflux disease, unspecified whether esophagitis present  Coronary artery disease involving native coronary artery of native heart without angina pectoris - Plan: CBC with Differential/Platelet, Comprehensive metabolic panel, Lipid panel, metoprolol tartrate (LOPRESSOR) 25 MG tablet, fenofibrate 160 MG tablet, Pitavastatin Calcium (LIVALO) 4 MG TABS, ezetimibe (ZETIA) 10 MG tablet  Tonsil cancer (HCC)  H/O bilateral cataract extraction  Skin cancer of scalp  Bilateral carotid bruits - Plan: US Carotid Bilateral  Arthritis  Essential hypertension - Plan: metoprolol tartrate (LOPRESSOR) 25 MG tablet, amLODipine (NORVASC) 5 MG tablet  Benign prostatic hyperplasia with incomplete bladder emptying  Stage 3a  chronic kidney disease (HCC)  Anxiety - Plan: ALPRAZolam (XANAX) 0.5 MG tablet  Dizziness  Statin myopathy  Immunization History  Administered Date(s) Administered   Fluad Quad(high Dose 65+) 01/25/2019, 03/06/2020, 03/05/2021   Influenza Split 03/18/2001, 03/17/2002, 03/18/2005, 02/25/2007, 02/10/2010, 01/29/2011, 02/15/2012   Influenza, High Dose Seasonal PF 03/13/2013, 02/28/2014, 02/06/2015, 02/21/2016, 02/08/2017, 03/08/2018   PFIZER(Purple Top)SARS-COV-2 Vaccination 07/27/2019, 08/21/2019, 04/23/2020   Pfizer Covid-19 Vaccine Bivalent Booster 76yr & up 03/20/2021   Pneumococcal Conjugate-13 11/11/2015   Pneumococcal Polysaccharide-23 03/26/2005, 03/24/2007   Td 06/28/1981, 03/26/2005   Tdap 11/12/2015, 05/01/2018   Zoster, Live 05/30/2010    Health Maintenance  Topic Date Due   Fecal DNA (Cologuard)  05/30/2021   Zoster Vaccines- Shingrix (1 of 2) 12/02/2021 (Originally 04/29/1965)   Pneumonia Vaccine  57+ Years old (3 - PPSV23 if available, else PCV20) 09/15/2023 (Originally 11/10/2016)   INFLUENZA VACCINE  12/16/2021   TETANUS/TDAP  05/01/2028   COVID-19 Vaccine  Completed   Hepatitis C Screening  Completed   HPV VACCINES  Aged Out   COLONOSCOPY (Pts 45-5yr Insurance coverage will need to be confirmed)  Discontinued   I discussed the anxiety with him.  He seems to be doing fairly well with him but it is sometimes interfering with sleep.  Discussed the fact that it might be a good idea to try and write down the things that are bothering him before he goes to bed with the idea of resolving them at a later date Discussed health benefits of physical activity, and encouraged him to engage in regular exercise appropriate for his age and condition.  Problem List Items Addressed This Visit     Arthritis   Benign prostatic hyperplasia with incomplete bladder emptying   Coronary artery disease involving native coronary artery of native heart without angina pectoris   Relevant  Medications   metoprolol tartrate (LOPRESSOR) 25 MG tablet   amLODipine (NORVASC) 5 MG tablet   fenofibrate 160 MG tablet   Pitavastatin Calcium (LIVALO) 4 MG TABS   ezetimibe (ZETIA) 10 MG tablet   Other Relevant Orders   CBC with Differential/Platelet   Comprehensive metabolic panel   Lipid panel   Essential hypertension   Relevant Medications   metoprolol tartrate (LOPRESSOR) 25 MG tablet   amLODipine (NORVASC) 5 MG tablet   fenofibrate 160 MG tablet   Pitavastatin Calcium (LIVALO) 4 MG TABS   ezetimibe (ZETIA) 10 MG tablet   GERD (gastroesophageal reflux disease)   H/O bilateral cataract extraction   Hypertriglyceridemia   Relevant Medications   metoprolol tartrate (LOPRESSOR) 25 MG tablet   amLODipine (NORVASC) 5 MG tablet   fenofibrate 160 MG tablet   Pitavastatin Calcium (LIVALO) 4 MG TABS   ezetimibe (ZETIA) 10 MG tablet   Other Relevant Orders   Lipid panel   Stage 3a chronic kidney disease (HCC)   Tonsil cancer (HCC)   Relevant Medications   ALPRAZolam (XANAX) 0.5 MG tablet   Other Visit Diagnoses     Routine general medical examination at a health care facility    -  Primary   Relevant Orders   CBC with Differential/Platelet   Comprehensive metabolic panel   Lipid panel   History of renal stone       Skin cancer of scalp       Relevant Medications   ALPRAZolam (XANAX) 0.5 MG tablet   Bilateral carotid bruits       Relevant Orders   [place] Bilateral   Anxiety       Relevant Medications   ALPRAZolam (XANAX) 0.5 MG tablet      Recheck in 1 year.  JJill Alexanders MD

## 2021-11-25 ENCOUNTER — Ambulatory Visit
Admission: RE | Admit: 2021-11-25 | Discharge: 2021-11-25 | Disposition: A | Discharge status: Home / Self Care | Payer: Medicare Other | Source: Ambulatory Visit | Attending: Family Medicine | Admitting: Family Medicine

## 2021-11-25 DIAGNOSIS — I6523 Occlusion and stenosis of bilateral carotid arteries: Secondary | ICD-10-CM | POA: Diagnosis not present

## 2021-11-25 DIAGNOSIS — R42 Dizziness and giddiness: Secondary | ICD-10-CM | POA: Diagnosis not present

## 2021-11-25 DIAGNOSIS — R0989 Other specified symptoms and signs involving the circulatory and respiratory systems: Secondary | ICD-10-CM | POA: Diagnosis not present

## 2021-11-25 LAB — COMPREHENSIVE METABOLIC PANEL
ALT: 12 IU/L (ref 0–44)
AST: 17 IU/L (ref 0–40)
Albumin/Globulin Ratio: 1.6 (ref 1.2–2.2)
Albumin: 4.7 g/dL (ref 3.8–4.8)
Alkaline Phosphatase: 61 IU/L (ref 44–121)
BUN/Creatinine Ratio: 12 (ref 10–24)
BUN: 13 mg/dL (ref 8–27)
Bilirubin Total: 0.4 mg/dL (ref 0.0–1.2)
CO2: 24 mmol/L (ref 20–29)
Calcium: 10.3 mg/dL — ABNORMAL HIGH (ref 8.6–10.2)
Chloride: 101 mmol/L (ref 96–106)
Creatinine, Ser: 1.11 mg/dL (ref 0.76–1.27)
Globulin, Total: 3 g/dL (ref 1.5–4.5)
Glucose: 92 mg/dL (ref 70–99)
Potassium: 4.8 mmol/L (ref 3.5–5.2)
Sodium: 143 mmol/L (ref 134–144)
Total Protein: 7.7 g/dL (ref 6.0–8.5)
eGFR: 69 mL/min/{1.73_m2} (ref >59)

## 2021-11-25 LAB — LIPID PANEL
Chol/HDL Ratio: 4 ratio (ref 0.0–5.0)
Cholesterol, Total: 158 mg/dL (ref 100–199)
HDL: 40 mg/dL (ref >39)
LDL Chol Calc (NIH): 78 mg/dL (ref 0–99)
Triglycerides: 239 mg/dL — ABNORMAL HIGH (ref 0–149)
VLDL Cholesterol Cal: 40 mg/dL (ref 5–40)

## 2021-11-25 LAB — CBC WITH DIFFERENTIAL/PLATELET
Basophils Absolute: 0 10*3/uL (ref 0.0–0.2)
Basos: 1 %
EOS (ABSOLUTE): 0.1 10*3/uL (ref 0.0–0.4)
Eos: 1 %
Hematocrit: 44.4 % (ref 37.5–51.0)
Hemoglobin: 14.8 g/dL (ref 13.0–17.7)
Immature Grans (Abs): 0 10*3/uL (ref 0.0–0.1)
Immature Granulocytes: 0 %
Lymphocytes Absolute: 1.2 10*3/uL (ref 0.7–3.1)
Lymphs: 17 %
MCH: 31.8 pg (ref 26.6–33.0)
MCHC: 33.3 g/dL (ref 31.5–35.7)
MCV: 95 fL (ref 79–97)
Monocytes Absolute: 0.8 10*3/uL (ref 0.1–0.9)
Monocytes: 11 %
Neutrophils Absolute: 5.1 10*3/uL (ref 1.4–7.0)
Neutrophils: 70 %
Platelets: 312 10*3/uL (ref 150–450)
RBC: 4.66 x10E6/uL (ref 4.14–5.80)
RDW: 12.9 % (ref 11.6–15.4)
WBC: 7.2 10*3/uL (ref 3.4–10.8)

## 2021-11-27 ENCOUNTER — Other Ambulatory Visit: Payer: Self-pay

## 2021-11-27 ENCOUNTER — Telehealth: Payer: Self-pay

## 2021-11-27 DIAGNOSIS — I6523 Occlusion and stenosis of bilateral carotid arteries: Secondary | ICD-10-CM

## 2021-11-27 NOTE — Telephone Encounter (Signed)
Error

## 2021-11-27 NOTE — Telephone Encounter (Signed)
Follow up should be done with Vascular and vein. Please advise of dx so order can be placed. Bruin

## 2021-12-15 DIAGNOSIS — C44319 Basal cell carcinoma of skin of other parts of face: Secondary | ICD-10-CM | POA: Diagnosis not present

## 2021-12-15 DIAGNOSIS — D485 Neoplasm of uncertain behavior of skin: Secondary | ICD-10-CM | POA: Diagnosis not present

## 2021-12-15 DIAGNOSIS — Z85828 Personal history of other malignant neoplasm of skin: Secondary | ICD-10-CM | POA: Diagnosis not present

## 2021-12-15 DIAGNOSIS — C4441 Basal cell carcinoma of skin of scalp and neck: Secondary | ICD-10-CM | POA: Diagnosis not present

## 2021-12-15 HISTORY — PX: SKIN BIOPSY: SHX1

## 2021-12-23 ENCOUNTER — Ambulatory Visit: Payer: Medicare Other | Admitting: Vascular Surgery

## 2021-12-23 ENCOUNTER — Encounter: Payer: Self-pay | Admitting: Vascular Surgery

## 2021-12-23 VITALS — BP 191/85 | HR 78 | Temp 98.9°F | Resp 20 | Ht 67.0 in | Wt 161.0 lb

## 2021-12-23 DIAGNOSIS — I6523 Occlusion and stenosis of bilateral carotid arteries: Secondary | ICD-10-CM | POA: Diagnosis not present

## 2021-12-23 NOTE — Progress Notes (Signed)
[facility] [place]  ASSESSMENT / PLAN: Matthew Moreno is a 76 y.o. male with asymptomatic bilateral (left >70%, right 50 to 69%) carotid artery stenosis.   The patient should continue best medical therapy for carotid artery stenosis including: Complete cessation from all tobacco products. Blood glucose control with goal A1c < 7%. Blood pressure control with goal blood pressure < 140/90 mmHg. Lipid reduction therapy with goal LDL-C <100 mg/dL  Aspirin '81mg'$  PO QD.  Atorvastatin 40-'80mg'$  PO QD (or other "high intensity" statin therapy).  I counseled the patient about the natural history of carotid artery stenosis and its risks related to stroke and TIA.  I do not think his symptoms can be attributed to carotid artery stenosis.  Recommend close interval follow-up with repeat duplex in 6 months.  Would only offer intervention if he suffered a stroke or mini stroke related to the carotid artery stenosis, or progressed to 80% stenosis.  CHIEF COMPLAINT: carotid stenosis  HISTORY OF PRESENT ILLNESS: Matthew Moreno is a 76 y.o. male who presents to clinic for discussion of recent carotid duplex that showed carotid artery stenosis bilaterally.  The patient reports the carotid duplex was precipitated by intermittent dizziness associated with positional changes of the head.  Patient reports no focal symptoms.  He denies amaurosis, facial droop, dysarthria, unilateral weakness or numbness.  He has never had a stroke or mini stroke before.  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 Rockne Menghini)   COLONOSCOPY  2010   Dr. Ardis Hughs   ESOPHAGOGASTRODUODENOSCOPY N/A 09/04/2018   Procedure: ESOPHAGOGASTRODUODENOSCOPY (EGD);  Surgeon: Carol Ada, MD;  Location: [facility];  Service: Endoscopy;  Laterality: N/A;   EYE SURGERY Bilateral    Cataract   FOREIGN BODY REMOVAL  09/04/2018   Procedure: FOREIGN BODY REMOVAL;  Surgeon: Carol Ada, MD;  Location: [facility] ENDOSCOPY;  Service: Endoscopy;;    Family History  Problem Relation Age of Onset   Heart Problems Mother    Heart Problems Father     Social History   Socioeconomic History   Marital status: Widowed    Spouse name: Not on file   Number of children: 2   Years of education: Not on file   Highest education level: Not on file  Occupational History   Occupation: pipe fitter    Employer: RETIRED    Comment: Retired  Tobacco Use   Smoking status: Former    Packs/day: 0.75    Years: 20.00    Total pack  years: 15.00    Types: Cigarettes    Quit date: 11/02/1983    Years since quitting: 38.1   Smokeless tobacco: Never   Tobacco comments:    Stopped Smoking ~ 20 years ago  Vaping Use   Vaping Use: Never used  Substance and Sexual Activity   Alcohol use: Yes    Alcohol/week: 3.0 standard drinks of alcohol    Types: 3 Cans of beer per week    Comment: 04/08/18 3 beers 2-3 night a week.    Drug use: No   Sexual activity: Not Currently  Other Topics Concern   Not on file  Social History Narrative   Patient is widowed. He had one  son and one daughter. The son passed away from Tylenol toxicity.   Social Determinants of Health   Financial Resource Strain: Low Risk  (11/07/2021)   Overall Financial Resource Strain (CARDIA)    Difficulty of Paying Living Expenses: Not hard at all  Food Insecurity: No Food Insecurity (11/07/2021)   Hunger Vital Sign    Worried About Running Out of Food in the Last Year: Never true    Ran Out of Food in the Last Year: Never true  Transportation Needs: No Transportation Needs (11/07/2021)   PRAPARE - Hydrologist (Medical): No    Lack of Transportation (Non-Medical): No  Physical Activity: Sufficiently Active (11/07/2021)   Exercise Vital Sign    Days of Exercise per Week: 7 days    Minutes of Exercise per Session: 30 min  Stress: No Stress Concern Present (11/07/2021)   [place]    Feeling of Stress : Only a little  Social Connections: Not on file  Intimate Partner Violence: Not At Risk (04/08/2018)   Humiliation, Afraid, Rape, and Kick questionnaire    Fear of Current or Ex-Partner: No    Emotionally Abused: No    Physically Abused: No    Sexually Abused: No    Allergies  Allergen Reactions   Bystolic [Nebivolol Hcl]    Lipitor [Atorvastatin] Other (See Comments)    Muscle aches with statins   Lisinopril    Pravastatin    Red Yeast Rice [Cholestin]    Sulfonamide Derivatives     REACTION: hives   Zocor [Simvastatin]     Current Outpatient Medications  Medication Sig Dispense Refill   ALPRAZolam (XANAX) 0.5 MG tablet Take 1 tablet (0.5 mg total) by mouth at bedtime as needed. 30 tablet 1   amLODipine (NORVASC) 5 MG tablet Take 1 tablet (5 mg total) by mouth daily. 90 tablet 3   aspirin 81 MG tablet Take 81 mg by mouth daily.     ezetimibe (ZETIA) 10 MG tablet Take 1 tablet (10 mg total) by mouth daily. 90 tablet 3   fenofibrate 160 MG tablet TAKE 1 TABLET(160 MG) BY MOUTH  DAILY 90 tablet 3   metoprolol tartrate (LOPRESSOR) 25 MG tablet 1/2 tablet twice a day 90 tablet 3   omeprazole (PRILOSEC) 20 MG capsule Take 20 mg by mouth daily.     Pitavastatin Calcium (LIVALO) 4 MG TABS Take 1 tablet (4 mg total) by mouth daily. 90 tablet 3   sodium fluoride (FLUORISHIELD) 1.1 % GEL dental gel Instill 1 drop into each tooth space of fluoride tray. Place over teeth for 5 minutes. Remove. Spit out excess. Repeat nightly. 120 mL prn   VENTOLIN HFA 108 (90 Base) MCG/ACT inhaler INHALE 2 PUFFS INTO  THE LUNGS EVERY 6 HOURS AS NEEDED FOR WHEEZING OR SHORTNESS OF BREATH 18 g 0   No current facility-administered medications for this visit.   Facility-Administered Medications Ordered in Other Visits  Medication Dose Route Frequency Provider Last Rate Last Admin   influenza  inactive virus vaccine (FLUZONE/FLUARIX) injection 0.5 mL  0.5 mL Intramuscular Once Denita Lung, MD        PHYSICAL EXAM Vitals:   12/23/21 1030 12/23/21 1033  BP: (!) 176/77 (!) 191/85  Pulse: 78   Resp: 20   Temp: 98.9 F (37.2 C)   SpO2: 100%   Weight: 161 lb (73 kg)   Height: '5\' 7"'$  (1.702 m)    Well-appearing elderly man in no acute distress Regular rate and rhythm Unlabored breathing   PERTINENT LABORATORY AND RADIOLOGIC DATA  Most recent CBC    Latest Ref Rng & Units 11/24/2021    2:43 PM 07/18/2020    1:30 PM 07/13/2019   10:19 AM  CBC  WBC 3.4 - 10.8 x10E3/uL 7.2  6.5  6.8   Hemoglobin 13.0 - 17.7 g/dL 14.8  15.3  15.3   Hematocrit 37.5 - 51.0 % 44.4  45.3  44.3   Platelets 150 - 450 x10E3/uL 312  288  348      Most recent CMP    Latest Ref Rng & Units 11/24/2021    2:43 PM 07/18/2020    1:30 PM 07/13/2019   10:19 AM  CMP  Glucose 70 - 99 mg/dL 92  78  96   BUN 8 - 27 mg/dL '13  14  12   '$ Creatinine 0.76 - 1.27 mg/dL 1.11  1.20  1.16   Sodium 134 - 144 mmol/L 143  142  139   Potassium 3.5 - 5.2 mmol/L 4.8  4.9  4.6   Chloride 96 - 106 mmol/L 101  104  102   CO2 20 - 29  mmol/L '24  20  24   '$ Calcium 8.6 - 10.2 mg/dL 10.3  10.4  10.1   Total Protein 6.0 - 8.5 g/dL 7.7  7.8  7.4   Total Bilirubin 0.0 - 1.2 mg/dL 0.4  0.4  0.4   Alkaline Phos 44 - 121 IU/L 61  67  62   AST 0 - 40 IU/L 17  33  30   ALT 0 - 44 IU/L '12  23  24     '$ Renal function CrCl cannot be calculated (Patient's most recent lab result is older than the maximum 21 days allowed.).  No results found for: "HGBA1C"  LDL Cholesterol (Calc)  Date Value Ref Range Status  03/30/2017 130 (H) mg/dL (calc) Final    Comment:    Reference range: <100 . Desirable range <100 mg/dL for primary prevention;   <70 mg/dL for patients with CHD or diabetic patients  with > or = 2 CHD risk factors. Marland Kitchen LDL-C is now calculated using the Martin-Hopkins  calculation, which is a validated novel method providing  better accuracy than the Friedewald equation in the  estimation of LDL-C.  Cresenciano Genre et al. Annamaria Helling. 5465;681(27): 2061-2068  (http://education.[facility].com/faq/FAQ164)    LDL Chol Calc (NIH)  Date Value Ref Range Status  11/24/2021 78 0 - 99 mg/dL Final   Direct LDL  Date Value Ref Range Status  05/04/2016 67 <130 mg/dL Final    Comment:        Desirable range <100 mg/dL for patients with CHD or diabetes and <70 mg/dL for diabetic patients  with known heart disease.      LDL Direct  Date Value Ref Range Status  03/07/2019 100 (H) 0 - 99 mg/dL Final    Outside carotid duplex 11/25/2021 1. Advanced atherosclerotic plaque of the right carotid bulb and proximal internal carotid artery results in greater than 70% stenosis by Doppler criteria. 2. Moderate atherosclerotic plaque of the left carotid bulb and proximal internal carotid artery results in 50-69% stenosis by Doppler criteria. 3. Bilateral vertebral arteries demonstrate normal antegrade flow.    Yevonne Aline. Stanford Breed, MD [facility] Phone Number: [phone] 12/23/2021 12:27 PM  Total  time spent on preparing this encounter including chart review, data review, collecting history, examining the patient, coordinating care for this new patient, 60 minutes.  Portions of this report may have been transcribed using voice recognition software.  Every effort has been made to ensure accuracy; however, inadvertent computerized transcription errors may still be present.

## 2021-12-26 ENCOUNTER — Other Ambulatory Visit: Payer: Self-pay

## 2021-12-26 DIAGNOSIS — I6523 Occlusion and stenosis of bilateral carotid arteries: Secondary | ICD-10-CM

## 2022-01-13 DIAGNOSIS — Z85828 Personal history of other malignant neoplasm of skin: Secondary | ICD-10-CM | POA: Diagnosis not present

## 2022-01-13 DIAGNOSIS — C44319 Basal cell carcinoma of skin of other parts of face: Secondary | ICD-10-CM | POA: Diagnosis not present

## 2022-01-14 DIAGNOSIS — C4441 Basal cell carcinoma of skin of scalp and neck: Secondary | ICD-10-CM

## 2022-01-14 HISTORY — DX: Basal cell carcinoma of skin of scalp and neck: C44.41

## 2022-01-21 ENCOUNTER — Encounter: Payer: Self-pay | Admitting: Internal Medicine

## 2022-01-22 ENCOUNTER — Encounter: Payer: Self-pay | Admitting: Family Medicine

## 2022-01-29 ENCOUNTER — Telehealth: Payer: Self-pay | Admitting: Family Medicine

## 2022-01-29 NOTE — Telephone Encounter (Signed)
Livalo samples given

## 2022-02-16 ENCOUNTER — Other Ambulatory Visit (INDEPENDENT_AMBULATORY_CARE_PROVIDER_SITE_OTHER): Payer: Medicare Other

## 2022-02-16 DIAGNOSIS — Z23 Encounter for immunization: Secondary | ICD-10-CM | POA: Diagnosis not present

## 2022-02-24 ENCOUNTER — Encounter: Payer: Self-pay | Admitting: Internal Medicine

## 2022-03-03 ENCOUNTER — Other Ambulatory Visit (INDEPENDENT_AMBULATORY_CARE_PROVIDER_SITE_OTHER): Payer: Medicare Other

## 2022-03-03 DIAGNOSIS — Z23 Encounter for immunization: Secondary | ICD-10-CM

## 2022-03-09 ENCOUNTER — Encounter: Payer: Self-pay | Admitting: Internal Medicine

## 2022-07-10 ENCOUNTER — Encounter: Payer: Self-pay | Admitting: Internal Medicine

## 2022-07-10 ENCOUNTER — Ambulatory Visit: Payer: Medicare Other | Attending: Internal Medicine | Admitting: Internal Medicine

## 2022-07-10 VITALS — BP 144/70 | HR 65 | Ht 66.0 in | Wt 160.0 lb

## 2022-07-10 DIAGNOSIS — T466X5D Adverse effect of antihyperlipidemic and antiarteriosclerotic drugs, subsequent encounter: Secondary | ICD-10-CM

## 2022-07-10 DIAGNOSIS — G720 Drug-induced myopathy: Secondary | ICD-10-CM

## 2022-07-10 DIAGNOSIS — I10 Essential (primary) hypertension: Secondary | ICD-10-CM

## 2022-07-10 DIAGNOSIS — E785 Hyperlipidemia, unspecified: Secondary | ICD-10-CM

## 2022-07-10 DIAGNOSIS — I2510 Atherosclerotic heart disease of native coronary artery without angina pectoris: Secondary | ICD-10-CM

## 2022-07-10 DIAGNOSIS — I6523 Occlusion and stenosis of bilateral carotid arteries: Secondary | ICD-10-CM

## 2022-07-10 NOTE — Progress Notes (Signed)
OFFICE NOTE  Chief Complaint:  No complaints  Primary Care Physician: Denita Lung, MD  HPI:  Matthew Moreno is a 77 year old gentleman we are following for history of coronary disease, moderate, in 2010 on catheterization, 50% LAD, 40% RCA and 50% OM lesion. He also has hypertension difficult to control, dyslipidemia and intolerance to statins. He has returned today with worsening leg pain which now he is attributing to fenofibrate. His most recent lipid profile obtained on April 11, 2012, showed total cholesterol 241, triglycerides 217, LDL was 158, HDL 40. His triglycerides are actually down from about 400 last year with the fenofibrate so that seems to be working. Unfortunately, he was directed to take Livalo 2 mg daily last year at his last visit but did not start that and is now contemplating discontinuing the fenofibrate as he is concerned this is causing his muscle pain. Since his last visit, he has stopped both the little and fenofibrate. Prior to his last blood work, he went out with some friends and was drinking bourbon in his triglycerides were 1400. Therefore difficult to know what his LDL actually is without measuring a direct LDL. We discussed options as alternatives to statins including WelChol and his idea, however he is not interested in any further medications.  Matthew Moreno has no new complaints today. 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 He is undergone radiation therapy and is apparently clear of this. Unfortunately he was intolerant to the statins and cannot afford to take fenofibrate. He is currently not on any therapy for lipid lowering and does have moderate coronary artery disease. We have discussed this in the past and felt there were a few additional options. He does say that he lost 20 or 30 pounds recently due to his cancer and wonders if his cholesterol is improved.  01/10/2016  Matthew Moreno returns today for follow-up. He seems to  be doing fairly well although recently he's had some worsening shortness of breath and fatigue. This is mostly with exertion. 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 Several days he's had to go back home and go to sleep for the rest of the day. He does not associate this with the hot weather. He denies any chest pain with this but has been more fatigued recently. He is concern is a friend of his had some similar symptoms and underwent a bypass surgery. Matthew Moreno does have a history of moderate coronary artery disease by catheterization in 2010. He's had difficult to control cholesterol due to intolerance to statins but has been able to take Livalo. Recent lab work indicated much higher triglycerides been previously, in fact greater than 400 therefore LDL was not calculated, however LDL last year was 102.  01/11/2017  Matthew Moreno is seen today in follow-up. Overall he is asymptomatic. He was having some shortness of breath but that improved. We did a stress test last year which was negative for ischemia. He's gained about 15 pounds since we last saw him, he feels that he's eating last however gaining weight. He denies any swelling. He's not had any significant muscle mass. He generally eats a large "country" breakfast, usually a sandwich for lunch and very little if anything for dinner. He's had elevated triglycerides in the past and an LDL over 100. I started him on Livalo which she is tolerating, but is not made a significant improvement in triglycerides rather his LDL was lower in the  60s. Fasting blood glucose is been in the low 100s in the past but he is not known to be diabetic.  01/12/2018  Matthew Moreno is seen today in follow-up.  Overall he continues to be asymptomatic.  He denies any chest pain or worsening shortness of breath.  Weight has been stable.  His last lipid profile 9 months ago by his PCP was not a goal showing total cholesterol 221, HDL 42,  triglycerides 345 and LDL 130.  Non-HDL cholesterol significantly elevated at 180 suggesting significant residual risk.  01/24/2019  Matthew Moreno is seen today in follow-up.  He denies any chest pain or worsening shortness of breath.  Blood pressure was elevated today 164/84 and a recheck came down to 142/74.  He says at home his blood pressure ranges from the low 130s up to 150s consistently.  He is only on half tablet of the metoprolol.  He does not think that that is making a significant effect on his blood pressure.  Cholesterol has not been reassessed recently and he is overdue for that.  He did have triglycerides checked in April which were elevated and remain persistently elevated.  He is possibly a good candidate for Vascepa about which we discussed today.  01/25/2020  Matthew Moreno is seen today in follow-up.  Overall he is feeling well.  However this past month was not so good.  He went on a trip apparently out to [place] with some friends and somebody had COVID-19.  He has been vaccinated fully with his last shot in April of the [employer] vaccine but did ultimately contract COVID-19.  He said he had 1 to 2 days of fever and ultimately felt that it might be going to his lungs and therefore got monoclonal antibody therapy through the [facility] outpatient clinic.  This worked almost immediately and he said he felt much better.  He is now out of quarantine.  Otherwise he is asymptomatic.  He did have lab work in February 2021 with his PCP showing total cholesterol 163, HDL 39, LDL 89 and triglycerides 207.  04/17/2021  Matthew Moreno returns today for follow-up.  Overall he says he feels well denies any chest pain or worsening shortness of breath.  Blood pressure was elevated today however he said did not take his blood pressure medicines this morning.  He reports that he had recently stopped taking his Livalo, probably earlier this year.  He had lipids in March which showed total cholesterol of 188, HDL 46, LDL  102 and triglycerides 236, this is higher than ideal.  He then said that they actually decrease the dose to 2 mg from 4 mg and has been compliant with that dose, noting that he has had improvement in some leg pain that he was having.  He has not had his cholesterol reassessed since making that dose change.  07/10/2022  Matthew Moreno is seen today in follow-up.  Overall again he says he is doing well without any specific complaints.  His PCP had investigated carotid bruits and noted that he has some significant carotid stenosis.  Namely greater than 70% stenosis in the proximal right internal carotid artery and 50 to 69% stenosis in the left internal carotid artery.  He was referred then to vascular surgery who is closely monitoring it and plans to repeat Dopplers in 6 months.  That is due quite soon in fact and he has not heard about scheduling it.  I have encouraged him to reach out to vascular surgery  to have this scheduled.  Other than that he denies any chest pain or shortness of breath.  His blood pressure was elevated today however he forgot to take his medicine.  He reports compliance with 3 cholesterol medications.  His last lipids in July showed total 158, HDL 40, triglycerides 239 and LDL 78.   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 Rockne Menghini)   COLONOSCOPY  2010   Dr. Ardis Hughs   ESOPHAGOGASTRODUODENOSCOPY N/A 09/04/2018   Procedure: ESOPHAGOGASTRODUODENOSCOPY (EGD);  Surgeon: Carol Ada, MD;  Location: [facility];  Service: Endoscopy;  Laterality: N/A;   EYE SURGERY Bilateral    Cataract   FOREIGN BODY REMOVAL  09/04/2018   Procedure: FOREIGN BODY REMOVAL;  Surgeon: Carol Ada, MD;  Location: [facility];  Service: Endoscopy;;   SKIN BIOPSY Right 12/15/2021   basal cell carcinoma of the scalp    FAMHx:  Family History  Problem Relation Age of Onset   Heart Problems Mother    Heart Problems Father     SOCHx:   reports that he quit smoking about 38 years ago. His smoking use included cigarettes. He has a 15.00 pack-year smoking history. He has never used smokeless tobacco. He reports current alcohol use of about 3.0 standard drinks of alcohol per week. He reports that he does not use drugs.  ALLERGIES:  Allergies  Allergen Reactions   Bystolic [Nebivolol Hcl]    Lipitor [Atorvastatin] Other (See Comments)    Muscle aches with statins   Lisinopril    Pravastatin    Red Yeast Rice [Cholestin]    Sulfonamide Derivatives     REACTION: hives   Zocor [Simvastatin]     ROS: Pertinent items noted in HPI and remainder of comprehensive ROS otherwise negative.  HOME MEDS: Current Outpatient Medications  Medication Sig Dispense Refill   ALPRAZolam (XANAX) 0.5 MG tablet Take 1 tablet (0.5 mg total) by mouth at bedtime as needed. 30 tablet 1   amLODipine (NORVASC) 5 MG tablet Take 1 tablet (5 mg total) by mouth daily. 90 tablet 3   aspirin 81 MG tablet Take 81 mg by mouth daily.     ezetimibe (ZETIA) 10 MG tablet Take 1 tablet (10 mg total) by  mouth daily. 90 tablet 3   fenofibrate 160 MG tablet TAKE 1 TABLET(160 MG) BY MOUTH DAILY 90 tablet 3   metoprolol tartrate (LOPRESSOR) 25 MG tablet 1/2 tablet twice a day 90 tablet 3  omeprazole (PRILOSEC) 20 MG capsule Take 20 mg by mouth daily.     Pitavastatin Calcium (LIVALO) 4 MG TABS Take 1 tablet (4 mg total) by mouth daily. 90 tablet 3   sodium fluoride (FLUORISHIELD) 1.1 % GEL dental gel Instill 1 drop into each tooth space of fluoride tray. Place over teeth for 5 minutes. Remove. Spit out excess. Repeat nightly. 120 mL prn   VENTOLIN HFA 108 (90 Base) MCG/ACT inhaler INHALE 2 PUFFS INTO THE LUNGS EVERY 6 HOURS AS NEEDED FOR WHEEZING OR SHORTNESS OF BREATH 18 g 0   No current facility-administered medications for this visit.   Facility-Administered Medications Ordered in Other Visits  Medication Dose Route Frequency Provider Last Rate Last Admin   influenza  inactive virus vaccine (FLUZONE/FLUARIX) injection 0.5 mL  0.5 mL Intramuscular Once Denita Lung, MD        LABS/IMAGING: No results found for this or any previous visit (from the past 48 hour(s)). No results found.  VITALS: BP (!) 144/70   Pulse 65   Ht '5\' 6"'$  (1.676 m)   Wt 160 lb (72.6 kg)   SpO2 96%   BMI 25.82 kg/m   EXAM: General appearance: alert and no distress Neck: no carotid bruit, no JVD and thyroid not enlarged, symmetric, no tenderness/mass/nodules Lungs: clear to auscultation bilaterally Heart: regular rate and rhythm, S1, S2 normal, no murmur, click, rub or gallop Abdomen: soft, non-tender; bowel sounds normal; no masses,  no organomegaly Extremities: extremities normal, atraumatic, no cyanosis or edema Pulses: 2+ and symmetric Skin: Skin color, texture, turgor normal. No rashes or lesions Neurologic: Grossly normal Psych: Pleasant  EKG: Normal sinus rhythm at 65-personally reviewed  ASSESSMENT: Moderate nonobstructive coronary artery disease (2010 cath) Hypertension Dyslipidemia - on  Livalo, ezetimibe and fenofibrate Recent tonsillar cancer DOE - LVEF 60-65%, mild AI (no discernible murmur) Moderate bilateral carotid artery stenosis-followed by vascular surgery  PLAN: 1.   Matthew Moreno recent findings of at least moderate bilateral carotid artery stenosis.  He has been seen by vascular surgery with a planned repeat vascular study in January 2024 however that has not occurred.  I encouraged him to reach out to the vascular surgery office to have that scheduled along with follow-up.  Lipids are reasonably well-controlled with LDL in the 70s.  I encouraged more exercise and continued dietary restriction of saturated fats.  Plan follow-up with me annually or sooner as necessary.  Pixie Casino, MD, [facility] [place] Director of the [facility] Lipid Disorders &  Cardiovascular Risk Reduction Clinic Diplomate of the American Board of Clinical Lipidology Attending Cardiologist  Direct Dial: [phone]  Fax: [phone]  Website:  [email or web address] 07/10/2022, 10:48 AM

## 2022-07-10 NOTE — Patient Instructions (Signed)
Medication Instructions:  Your physician recommends that you continue on your current medications as directed. Please refer to the Current Medication list given to you today.  *If you need a refill on your cardiac medications before your next appointment, please call your pharmacy*     Follow-Up: At [facility], you and your health needs are our priority.  As part of our continuing mission to provide you with exceptional heart care, we have created designated Provider Care Teams.  These Care Teams include your primary Cardiologist (physician) and Advanced Practice Providers (APPs -  Physician Assistants and Nurse Practitioners) who all work together to provide you with the care you need, when you need it.  We recommend signing up for the patient portal called "MyChart".  Sign up information is provided on this After Visit Summary.  MyChart is used to connect with patients for Virtual Visits (Telemedicine).  Patients are able to view lab/test results, encounter notes, upcoming appointments, etc.  Non-urgent messages can be sent to your provider as well.   To learn more about what you can do with MyChart, go to [email or web address].    Your next appointment:    12 months with Dr. Debara Pickett     The [facility] offers assistance to help pay for medication copays.  They will cover copays for all cholesterol lowering meds, including statins, fibrates, omega-3 fish oils like Vascepa, ezetimibe, Repatha, Praluent, Nexletol, Nexlizet.  The cards are usually good for $2,500 or 12 months, whichever comes first. Go to [email or web address].org Click on "Apply Now" Answer questions as to whom is applying (patient or representative) Your disease fund will be "hypercholesterolemia - Medicare access" They will ask questions about finances and which medications you are taking for cholesterol When you submit, the approval is usually within minutes.  You will need to print the card  information from the site You will need to show this information to your pharmacy, they will bill your Medicare Part D plan first -then bill Health Well --for the copay.   You can also call them at [phone], although the hold times can be quite long.

## 2022-07-13 NOTE — Progress Notes (Unsigned)
[facility] [place]  ASSESSMENT / PLAN: Matthew Moreno is a 78 y.o. male with asymptomatic bilateral (left >70%, right 50 to 69%) carotid artery stenosis.   The patient should continue best medical therapy for carotid artery stenosis including: Complete cessation from all tobacco products. Blood glucose control with goal A1c < 7%. Blood pressure control with goal blood pressure < 140/90 mmHg. Lipid reduction therapy with goal LDL-C <100 mg/dL  Aspirin '81mg'$  PO QD.  Atorvastatin 40-'80mg'$  PO QD (or other "high intensity" statin therapy).  I counseled the patient about the natural history of carotid artery stenosis and its risks related to stroke and TIA.  I do not think his symptoms can be attributed to carotid artery stenosis.  Recommend close interval follow-up with repeat duplex in 6 months.  Would only offer intervention if he suffered a stroke or mini stroke related to the carotid artery stenosis, or progressed to 80% stenosis.  CHIEF COMPLAINT: carotid stenosis  HISTORY OF PRESENT ILLNESS: Matthew Moreno is a 77 y.o. male who presents to clinic for discussion of recent carotid duplex that showed carotid artery stenosis bilaterally.  The patient reports the carotid duplex was precipitated by intermittent dizziness associated with positional changes of the head.  Patient reports no focal symptoms.  He denies amaurosis, facial droop, dysarthria, unilateral weakness or numbness.  He has never had a stroke or mini stroke before.  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 Rockne Menghini)   COLONOSCOPY  2010   Dr. Ardis Hughs   ESOPHAGOGASTRODUODENOSCOPY N/A 09/04/2018   Procedure: ESOPHAGOGASTRODUODENOSCOPY (EGD);  Surgeon: Carol Ada, MD;  Location: [facility];  Service: Endoscopy;  Laterality: N/A;   EYE SURGERY Bilateral    Cataract   FOREIGN BODY REMOVAL  09/04/2018   Procedure: FOREIGN BODY REMOVAL;  Surgeon: Carol Ada, MD;  Location: [facility];  Service: Endoscopy;;   SKIN BIOPSY Right 12/15/2021   basal cell carcinoma of the scalp    Family History  Problem Relation Age of Onset   Heart Problems Mother    Heart Problems Father     Social History   Socioeconomic History   Marital status: Widowed    Spouse name: Not on file   Number of children: 2   Years of education: Not on file   Highest education level: Not on file  Occupational History   Occupation: pipe fitter    Employer: RETIRED    Comment: Retired  Tobacco Use   Smoking status: Former    Packs/day: 0.75    Years: 20.00    Total pack years: 15.00    Types: Cigarettes    Quit date: 11/02/1983    Years since quitting: 38.7   Smokeless tobacco: Never   Tobacco comments:    Stopped Smoking ~ 20 years ago  Vaping Use   Vaping Use: Never used  Substance and Sexual Activity   Alcohol use: Yes    Alcohol/week: 3.0 standard drinks of alcohol    Types: 3 Cans of beer per week    Comment: 04/08/18 3 beers 2-3 night a week.    Drug use: No   Sexual activity: Not  Currently  Other Topics Concern   Not on file  Social History Narrative   Patient is widowed. He had one son and one daughter. The son passed away from Tylenol toxicity.   Social Determinants of Health   Financial Resource Strain: Low Risk  (11/07/2021)   Overall Financial Resource Strain (CARDIA)    Difficulty of Paying Living Expenses: Not hard at all  Food Insecurity: No Food Insecurity (11/07/2021)   Hunger Vital Sign    Worried About Running Out of Food in the Last Year: Never true    Ran Out of Food in the Last Year: Never true  Transportation Needs: No Transportation Needs (11/07/2021)   PRAPARE - Hydrologist (Medical): No    Lack of Transportation (Non-Medical): No  Physical Activity: Sufficiently Active (11/07/2021)   Exercise Vital Sign    Days of Exercise per Week: 7 days    Minutes of Exercise per Session: 30 min  Stress: No Stress Concern Present (11/07/2021)   [place]    Feeling of Stress : Only a little  Social Connections: Not on file  Intimate Partner Violence: Not At Risk (04/08/2018)   Humiliation, Afraid, Rape, and Kick questionnaire    Fear of Current or Ex-Partner: No    Emotionally Abused: No    Physically Abused: No    Sexually Abused: No    Allergies  Allergen Reactions   Bystolic [Nebivolol Hcl]    Lipitor [Atorvastatin] Other (See Comments)    Muscle aches with statins   Lisinopril    Pravastatin    Red Yeast Rice [Cholestin]    Sulfonamide Derivatives     REACTION: hives   Zocor [Simvastatin]     Current Outpatient Medications  Medication Sig Dispense Refill   ALPRAZolam (XANAX) 0.5 MG tablet Take 1 tablet (0.5 mg total) by mouth at bedtime as needed. 30 tablet 1   amLODipine (NORVASC) 5 MG tablet Take 1 tablet (5 mg total) by mouth daily. 90 tablet 3   aspirin 81 MG tablet Take 81 mg by mouth daily.     ezetimibe (ZETIA) 10 MG tablet Take 1  tablet (10 mg total) by mouth daily. 90 tablet 3   fenofibrate 160 MG tablet TAKE 1 TABLET(160 MG) BY MOUTH DAILY 90 tablet 3   metoprolol tartrate (LOPRESSOR) 25 MG tablet 1/2 tablet twice a day 90 tablet 3   omeprazole (PRILOSEC) 20 MG capsule Take 20 mg by mouth daily.     Pitavastatin Calcium (LIVALO) 4 MG TABS Take 1 tablet (4 mg total) by mouth daily. 90 tablet 3   sodium fluoride (FLUORISHIELD) 1.1 % GEL dental gel Instill 1 drop into each tooth space of fluoride tray. Place over teeth for 5 minutes.  Remove. Spit out excess. Repeat nightly. 120 mL prn   VENTOLIN HFA 108 (90 Base) MCG/ACT inhaler INHALE 2 PUFFS INTO THE LUNGS EVERY 6 HOURS AS NEEDED FOR WHEEZING OR SHORTNESS OF BREATH 18 g 0   No current facility-administered medications for this visit.   Facility-Administered Medications Ordered in Other Visits  Medication Dose Route Frequency Provider Last Rate Last Admin   influenza  inactive virus vaccine (FLUZONE/FLUARIX) injection 0.5 mL  0.5 mL Intramuscular Once Denita Lung, MD        PHYSICAL EXAM There were no vitals filed for this visit.  Well-appearing elderly man in no acute distress Regular rate and rhythm Unlabored breathing   PERTINENT LABORATORY AND RADIOLOGIC DATA  Most recent CBC    Latest Ref Rng & Units 11/24/2021    2:43 PM 07/18/2020    1:30 PM 07/13/2019   10:19 AM  CBC  WBC 3.4 - 10.8 x10E3/uL 7.2  6.5  6.8   Hemoglobin 13.0 - 17.7 g/dL 14.8  15.3  15.3   Hematocrit 37.5 - 51.0 % 44.4  45.3  44.3   Platelets 150 - 450 x10E3/uL 312  288  348      Most recent CMP    Latest Ref Rng & Units 11/24/2021    2:43 PM 07/18/2020    1:30 PM 07/13/2019   10:19 AM  CMP  Glucose 70 - 99 mg/dL 92  78  96   BUN 8 - 27 mg/dL '13  14  12   '$ Creatinine 0.76 - 1.27 mg/dL 1.11  1.20  1.16   Sodium 134 - 144 mmol/L 143  142  139   Potassium 3.5 - 5.2 mmol/L 4.8  4.9  4.6   Chloride 96 - 106 mmol/L 101  104  102   CO2 20 - 29 mmol/L '24  20  24   '$ Calcium 8.6 -  10.2 mg/dL 10.3  10.4  10.1   Total Protein 6.0 - 8.5 g/dL 7.7  7.8  7.4   Total Bilirubin 0.0 - 1.2 mg/dL 0.4  0.4  0.4   Alkaline Phos 44 - 121 IU/L 61  67  62   AST 0 - 40 IU/L 17  33  30   ALT 0 - 44 IU/L '12  23  24     '$ Renal function CrCl cannot be calculated (Patient's most recent lab result is older than the maximum 21 days allowed.).  No results found for: "HGBA1C"  LDL Cholesterol (Calc)  Date Value Ref Range Status  03/30/2017 130 (H) mg/dL (calc) Final    Comment:    Reference range: <100 . Desirable range <100 mg/dL for primary prevention;   <70 mg/dL for patients with CHD or diabetic patients  with > or = 2 CHD risk factors. Marland Kitchen LDL-C is now calculated using the Martin-Hopkins  calculation, which is a validated novel method providing  better accuracy than the Friedewald equation in the  estimation of LDL-C.  Cresenciano Genre et al. Annamaria Helling. [id number]): 2061-2068  (http://education.[employer].com/faq/FAQ164)    LDL Chol Calc (NIH)  Date Value Ref Range Status  11/24/2021 78 0 - 99 mg/dL Final   Direct LDL  Date Value Ref Range Status  05/04/2016 67 <130 mg/dL Final    Comment:        Desirable range <100 mg/dL for patients with CHD or diabetes and <70 mg/dL for diabetic patients with known heart disease.      LDL Direct  Date Value Ref Range Status  03/07/2019 100 (H) 0 - 99 mg/dL Final    Outside carotid duplex 11/25/2021 1. Advanced atherosclerotic plaque of the right carotid bulb and proximal internal carotid artery results in greater than 70% stenosis by Doppler criteria. 2. Moderate atherosclerotic plaque of the left carotid bulb and proximal internal carotid artery results in 50-69% stenosis by Doppler criteria. 3. Bilateral vertebral arteries demonstrate normal antegrade flow.    Yevonne Aline. Stanford Breed, MD [facility] Phone Number: [phone] 07/13/2022 8:26 PM  Total time spent on preparing this  encounter including chart review, data review, collecting history, examining the patient, coordinating care for this new patient, 60 minutes.  Portions of this report may have been transcribed using voice recognition software.  Every effort has been made to ensure accuracy; however, inadvertent computerized transcription errors may still be present.

## 2022-07-14 ENCOUNTER — Encounter: Payer: Self-pay | Admitting: Vascular Surgery

## 2022-07-14 ENCOUNTER — Other Ambulatory Visit: Payer: Self-pay | Admitting: *Deleted

## 2022-07-14 ENCOUNTER — Ambulatory Visit (HOSPITAL_COMMUNITY)
Admission: RE | Admit: 2022-07-14 | Discharge: 2022-07-14 | Disposition: A | Discharge status: Home / Self Care | Payer: Medicare Other | Source: Ambulatory Visit | Attending: Vascular Surgery | Admitting: Vascular Surgery

## 2022-07-14 ENCOUNTER — Ambulatory Visit: Payer: Medicare Other | Admitting: Vascular Surgery

## 2022-07-14 VITALS — BP 156/70 | HR 61 | Temp 98.5°F | Resp 20 | Ht 66.0 in | Wt 156.0 lb

## 2022-07-14 DIAGNOSIS — I6523 Occlusion and stenosis of bilateral carotid arteries: Secondary | ICD-10-CM

## 2022-07-17 ENCOUNTER — Other Ambulatory Visit: Payer: Self-pay

## 2022-07-17 DIAGNOSIS — I6523 Occlusion and stenosis of bilateral carotid arteries: Secondary | ICD-10-CM

## 2022-10-20 ENCOUNTER — Ambulatory Visit (INDEPENDENT_AMBULATORY_CARE_PROVIDER_SITE_OTHER): Payer: Medicare Other

## 2022-10-20 VITALS — BP 150/70 | HR 75 | Temp 97.9°F | Ht 66.5 in | Wt 158.0 lb

## 2022-10-20 DIAGNOSIS — Z0000 Encounter for general adult medical examination without abnormal findings: Secondary | ICD-10-CM

## 2022-10-20 NOTE — Patient Instructions (Signed)
Matthew Moreno , Thank you for taking time to come for your Medicare Wellness Visit. I appreciate your ongoing commitment to your health goals. Please review the following plan we discussed and let me know if I can assist you in the future.   These are the goals we discussed:  Goals      Patient Stated     11/07/2021, no goals     Patient Stated     10/20/2022, no goals        This is a list of the screening recommended for you and due dates:  Health Maintenance  Topic Date Due   Zoster (Shingles) Vaccine (1 of 2) Never done   COVID-19 Vaccine (6 - 2023-24 season) 04/28/2022   Pneumonia Vaccine (3 of 3 - PPSV23 or PCV20) 09/15/2023*   Flu Shot  12/17/2022   Medicare Annual Wellness Visit  10/20/2023   DTaP/Tdap/Td vaccine (5 - Td or Tdap) 05/01/2028   Hepatitis C Screening  Completed   HPV Vaccine  Aged Out   Colon Cancer Screening  Discontinued   Cologuard (Stool DNA test)  Discontinued  *Topic was postponed. The date shown is not the original due date.    Advanced directives: copy in chart  Conditions/risks identified: none  Next appointment: Follow up in one year for your annual wellness visit.   Preventive Care 10 Years and Older, Male  Preventive care refers to lifestyle choices and visits with your health care provider that can promote health and wellness. What does preventive care include? A yearly physical exam. This is also called an annual well check. Dental exams once or twice a year. Routine eye exams. Ask your health care provider how often you should have your eyes checked. Personal lifestyle choices, including: Daily care of your teeth and gums. Regular physical activity. Eating a healthy diet. Avoiding tobacco and drug use. Limiting alcohol use. Practicing safe sex. Taking low doses of aspirin every day. Taking vitamin and mineral supplements as recommended by your health care provider. What happens during an annual well check? The services and  screenings done by your health care provider during your annual well check will depend on your age, overall health, lifestyle risk factors, and family history of disease. Counseling  Your health care provider may ask you questions about your: Alcohol use. Tobacco use. Drug use. Emotional well-being. Home and relationship well-being. Sexual activity. Eating habits. History of falls. Memory and ability to understand (cognition). Work and work Astronomer. Screening  You may have the following tests or measurements: Height, weight, and BMI. Blood pressure. Lipid and cholesterol levels. These may be checked every 5 years, or more frequently if you are over 34 years old. Skin check. Lung cancer screening. You may have this screening every year starting at age 75 if you have a 30-pack-year history of smoking and currently smoke or have quit within the past 15 years. Fecal occult blood test (FOBT) of the stool. You may have this test every year starting at age 88. Flexible sigmoidoscopy or colonoscopy. You may have a sigmoidoscopy every 5 years or a colonoscopy every 10 years starting at age 106. Prostate cancer screening. Recommendations will vary depending on your family history and other risks. Hepatitis C blood test. Hepatitis B blood test. Sexually transmitted disease (STD) testing. Diabetes screening. This is done by checking your blood sugar (glucose) after you have not eaten for a while (fasting). You may have this done every 1-3 years. Abdominal aortic aneurysm (AAA) screening. You may  need this if you are a current or former smoker. Osteoporosis. You may be screened starting at age 38 if you are at high risk. Talk with your health care provider about your test results, treatment options, and if necessary, the need for more tests. Vaccines  Your health care provider may recommend certain vaccines, such as: Influenza vaccine. This is recommended every year. Tetanus, diphtheria, and  acellular pertussis (Tdap, Td) vaccine. You may need a Td booster every 10 years. Zoster vaccine. You may need this after age 75. Pneumococcal 13-valent conjugate (PCV13) vaccine. One dose is recommended after age 28. Pneumococcal polysaccharide (PPSV23) vaccine. One dose is recommended after age 43. Talk to your health care provider about which screenings and vaccines you need and how often you need them. This information is not intended to replace advice given to you by your health care provider. Make sure you discuss any questions you have with your health care provider. Document Released: 05/31/2015 Document Revised: 01/22/2016 Document Reviewed: 03/05/2015 Elsevier Interactive Patient Education  2017 [employer].  Fall Prevention in the Home Falls can cause injuries. They can happen to people of all ages. There are many things you can do to make your home safe and to help prevent falls. What can I do on the outside of my home? Regularly fix the edges of walkways and driveways and fix any cracks. Remove anything that might make you trip as you walk through a door, such as a raised step or threshold. Trim any bushes or trees on the path to your home. Use bright outdoor lighting. Clear any walking paths of anything that might make someone trip, such as rocks or tools. Regularly check to see if handrails are loose or broken. Make sure that both sides of any steps have handrails. Any raised decks and porches should have guardrails on the edges. Have any leaves, snow, or ice cleared regularly. Use sand or salt on walking paths during winter. Clean up any spills in your garage right away. This includes oil or grease spills. What can I do in the bathroom? Use night lights. Install grab bars by the toilet and in the tub and shower. Do not use towel bars as grab bars. Use non-skid mats or decals in the tub or shower. If you need to sit down in the shower, use a plastic, non-slip stool. Keep  the floor dry. Clean up any water that spills on the floor as soon as it happens. Remove soap buildup in the tub or shower regularly. Attach bath mats securely with double-sided non-slip rug tape. Do not have throw rugs and other things on the floor that can make you trip. What can I do in the bedroom? Use night lights. Make sure that you have a light by your bed that is easy to reach. Do not use any sheets or blankets that are too big for your bed. They should not hang down onto the floor. Have a firm chair that has side arms. You can use this for support while you get dressed. Do not have throw rugs and other things on the floor that can make you trip. What can I do in the kitchen? Clean up any spills right away. Avoid walking on wet floors. Keep items that you use a lot in easy-to-reach places. If you need to reach something above you, use a strong step stool that has a grab bar. Keep electrical cords out of the way. Do not use floor polish or wax that makes  floors slippery. If you must use wax, use non-skid floor wax. Do not have throw rugs and other things on the floor that can make you trip. What can I do with my stairs? Do not leave any items on the stairs. Make sure that there are handrails on both sides of the stairs and use them. Fix handrails that are broken or loose. Make sure that handrails are as long as the stairways. Check any carpeting to make sure that it is firmly attached to the stairs. Fix any carpet that is loose or worn. Avoid having throw rugs at the top or bottom of the stairs. If you do have throw rugs, attach them to the floor with carpet tape. Make sure that you have a light switch at the top of the stairs and the bottom of the stairs. If you do not have them, ask someone to add them for you. What else can I do to help prevent falls? Wear shoes that: Do not have high heels. Have rubber bottoms. Are comfortable and fit you well. Are closed at the toe. Do not  wear sandals. If you use a stepladder: Make sure that it is fully opened. Do not climb a closed stepladder. Make sure that both sides of the stepladder are locked into place. Ask someone to hold it for you, if possible. Clearly mark and make sure that you can see: Any grab bars or handrails. First and last steps. Where the edge of each step is. Use tools that help you move around (mobility aids) if they are needed. These include: Canes. Walkers. Scooters. Crutches. Turn on the lights when you go into a dark area. Replace any light bulbs as soon as they burn out. Set up your furniture so you have a clear path. Avoid moving your furniture around. If any of your floors are uneven, fix them. If there are any pets around you, be aware of where they are. Review your medicines with your doctor. Some medicines can make you feel dizzy. This can increase your chance of falling. Ask your doctor what other things that you can do to help prevent falls. This information is not intended to replace advice given to you by your health care provider. Make sure you discuss any questions you have with your health care provider. Document Released: 02/28/2009 Document Revised: 10/10/2015 Document Reviewed: 06/08/2014 Elsevier Interactive Patient Education  2017 [employer].

## 2022-10-20 NOTE — Progress Notes (Signed)
Subjective:   KAIF BAGSHAW is a 77 y.o. male who presents for Medicare Annual/Subsequent preventive examination.  Patient Medicare AWV questionnaire was completed by the patient on 10/19/2022; I have confirmed that all information answered by patient is correct and no changes since this date.     Review of Systems     Cardiac Risk Factors include: advanced age (>32men, >40 women);hypertension;male gender     Objective:    Today's Vitals   10/20/22 1137  BP: (!) 150/70  Pulse: 75  Temp: 97.9 F (36.6 C)  TempSrc: Oral  SpO2: 99%  Weight: 158 lb (71.7 kg)  Height: 5' 6.5" (1.689 m)   Body mass index is 25.12 kg/m.     10/20/2022   11:46 AM 11/07/2021    9:47 AM 07/18/2020   11:56 AM 07/13/2019    9:54 AM 09/04/2018    5:48 PM 05/24/2018    9:56 AM 05/01/2018    9:36 PM  Advanced Directives  Does Patient Have a Medical Advance Directive? Yes Yes Yes No Yes  No  Type of Estate agent of [place];Living will Living will Living will;Healthcare Power of Attorney  Living will    Does patient want to make changes to medical advance directive?     No - Patient declined    Copy of Healthcare Power of Attorney in Chart? Yes - validated most recent copy scanned in chart (See row information)  No - copy requested      Would patient like information on creating a medical advance directive?    No - Patient declined  Yes (MAU/Ambulatory/Procedural Areas - Information given) No - Patient declined    Current Medications (verified) Outpatient Encounter Medications as of 10/20/2022  Medication Sig   ALPRAZolam (XANAX) 0.5 MG tablet Take 1 tablet (0.5 mg total) by mouth at bedtime as needed.   amLODipine (NORVASC) 5 MG tablet Take 1 tablet (5 mg total) by mouth daily.   aspirin 81 MG tablet Take 81 mg by mouth daily.   ezetimibe (ZETIA) 10 MG tablet Take 1 tablet (10 mg total) by mouth daily.   fenofibrate 160 MG tablet TAKE 1 TABLET(160 MG) BY MOUTH DAILY   metoprolol  tartrate (LOPRESSOR) 25 MG tablet 1/2 tablet twice a day   omeprazole (PRILOSEC) 20 MG capsule Take 20 mg by mouth daily.   Pitavastatin Calcium (LIVALO) 4 MG TABS Take 1 tablet (4 mg total) by mouth daily.   sodium fluoride (FLUORISHIELD) 1.1 % GEL dental gel Instill 1 drop into each tooth space of fluoride tray. Place over teeth for 5 minutes. Remove. Spit out excess. Repeat nightly.   VENTOLIN HFA 108 (90 Base) MCG/ACT inhaler INHALE 2 PUFFS INTO THE LUNGS EVERY 6 HOURS AS NEEDED FOR WHEEZING OR SHORTNESS OF BREATH   Facility-Administered Encounter Medications as of 10/20/2022  Medication   influenza  inactive virus vaccine (FLUZONE/FLUARIX) injection 0.5 mL    Allergies (verified) Bystolic [nebivolol hcl], Lipitor [atorvastatin], Lisinopril, Pravastatin, Red yeast rice [cholestin], Sulfonamide derivatives, and Zocor [simvastatin]   History: Past Medical History:  Diagnosis Date   Basal cell carcinoma of scalp 01/14/2022   CAD (coronary artery disease) 2008   Dyslipidemia    Dysphagia    Family history of heart disease    Hypertension    Nephrolithiasis    H/O multiple "kidney stones", Lithotripsies, J-Stents   Panic attacks    S/P radiation therapy 02/12/2014-04/02/2014   Left tonsil and bilateral neck / 70 Gy in 35  fractions to gross disease, 63 Gy in 35 fractions to high risk nodalechelons, and 56 Gy in 35 fractions to intermediate risk nodal echelons    Statin intolerance    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 Langston Reusing)   COLONOSCOPY  2010   Dr. Christella Hartigan   ESOPHAGOGASTRODUODENOSCOPY N/A 09/04/2018    Procedure: ESOPHAGOGASTRODUODENOSCOPY (EGD);  Surgeon: Jeani Hawking, MD;  Location: [facility] ENDOSCOPY;  Service: Endoscopy;  Laterality: N/A;   EYE SURGERY Bilateral    Cataract   FOREIGN BODY REMOVAL  09/04/2018   Procedure: FOREIGN BODY REMOVAL;  Surgeon: Jeani Hawking, MD;  Location: [facility] ENDOSCOPY;  Service: Endoscopy;;   SKIN BIOPSY Right 12/15/2021   basal cell carcinoma of the scalp   Family History  Problem Relation Age of Onset   Heart Problems Mother    Heart Problems Father    Social History   Socioeconomic History   Marital status: Widowed    Spouse name: Not on file   Number of children: 2   Years of education: Not on file   Highest education level: Not on file  Occupational History   Occupation: pipe fitter    Employer: RETIRED    Comment: Retired  Tobacco Use   Smoking status: Former    Packs/day: 0.75    Years: 20.00    Additional pack years: 0.00    Total pack years: 15.00    Types: Cigarettes    Quit date: 11/02/1983    Years since quitting: 38.9   Smokeless tobacco: Never   Tobacco comments:    Stopped Smoking ~ 20 years ago  Vaping Use   Vaping Use: Never used  Substance and Sexual Activity   Alcohol use: Not Currently    Alcohol/week: 3.0 standard drinks of alcohol    Types: 3 Cans of beer per week    Comment: 04/08/18 3 beers 2-3 night a week.    Drug use: No   Sexual activity: Not Currently  Other Topics Concern   Not on file  Social History Narrative   Patient is widowed. He had one son and one daughter. The son passed away from Tylenol toxicity.   Social Determinants of Health   Financial Resource Strain: Low Risk  (10/20/2022)   Overall Financial Resource Strain (CARDIA)    Difficulty of Paying Living Expenses: Not hard at all  Food Insecurity: No Food Insecurity (10/20/2022)   Hunger Vital Sign    Worried About Running Out of Food in the Last Year: Never true    Ran Out of Food in the Last Year: Never true  Transportation Needs: No  Transportation Needs (10/20/2022)   PRAPARE - Administrator, Civil Service (Medical): No    Lack of Transportation (Non-Medical): No  Physical Activity: Sufficiently Active (10/20/2022)   Exercise Vital Sign    Days of Exercise per Week: 7 days    Minutes of Exercise per Session: 30 min  Stress: No Stress Concern Present (10/20/2022)   [employer] of Occupational Health - Occupational Stress Questionnaire    Feeling of Stress : Not at all  Social Connections: Not on file    Tobacco Counseling Counseling given: Not Answered Tobacco comments: Stopped Smoking ~ 20 years ago   Clinical Intake:  Pre-visit preparation completed: Yes  Pain : No/denies pain     Nutritional Status: BMI 25 -29 Overweight Nutritional Risks: None Diabetes: No  How often do you need to have someone help you when you read instructions, pamphlets, or other written materials from your doctor or pharmacy?: 1 - Never  Diabetic? no  Interpreter Needed?: No  Information entered by :: NAllen LPN   Activities of Daily Living    10/20/2022   11:48 AM 10/19/2022    1:12 PM  In your present state of health, do you have any difficulty performing the following activities:  Hearing? 0 0  Vision? 0 0  Difficulty concentrating or making decisions? 0 0  Walking or climbing stairs? 0 0  Dressing or bathing? 0 0  Doing errands, shopping? 0 0  Preparing Food and eating ? N N  Using the Toilet? N N  In the past six months, have you accidently leaked urine? Y Y  Do you have problems with loss of bowel control? N N  Managing your Medications? N N  Managing your Finances? N N  Housekeeping or managing your Housekeeping? N N    Patient Care Team: Ronnald Nian, MD as PCP - General (Family Medicine) Lonie Peak, MD as Attending Physician (Radiation Oncology) Barrie Folk, RN (Inactive) as Oncology Nurse Navigator  Indicate any recent Medical Services you may have received from other than  [facility] providers in the past year (date may be approximate).     Assessment:   This is a routine wellness examination for Chrissie Noa.  Hearing/Vision screen Vision Screening - Comments:: No regular eye exams,   Dietary issues and exercise activities discussed: Current Exercise Habits: Home exercise routine, Type of exercise: walking, Time (Minutes): 30, Frequency (Times/Week): 7, Weekly Exercise (Minutes/Week): 210   Goals Addressed             This Visit's Progress    Patient Stated       10/20/2022, no goals       Depression Screen    10/20/2022   11:47 AM 11/07/2021    9:49 AM 08/01/2021    8:26 AM 07/18/2020   11:58 AM 07/13/2019    9:33 AM 03/02/2019    8:18 AM 05/24/2018    9:39 AM  PHQ 2/9 Scores  PHQ - 2 Score 0 0 0 0 0 0 0  PHQ- 9 Score 0 0         Fall Risk    10/20/2022   11:46 AM 10/19/2022    1:12 PM 11/07/2021    9:49 AM 11/04/2021    2:51 PM 08/01/2021    8:26 AM  Fall Risk   Falls in the past year? 0 0 0 0 0  Number falls in past yr: 0 0 0 0 0  Injury with Fall? 0 0 0 0 0  Risk for fall due to : Medication side effect  Medication side effect  No Fall Risks  Follow up Falls prevention discussed;Education provided;Falls evaluation completed  Falls evaluation completed;Education provided;Falls prevention discussed  Falls evaluation completed    FALL RISK PREVENTION PERTAINING TO THE HOME:  Any stairs in or around the home? Yes  If so, are there any without handrails? No  Home free of loose throw rugs in walkways, pet beds, electrical cords, etc? Yes  Adequate  lighting in your home to reduce risk of falls? Yes   ASSISTIVE DEVICES UTILIZED TO PREVENT FALLS:  Life alert? No  Use of a cane, walker or w/c? Yes  Grab bars in the bathroom? No  Shower chair or bench in shower? No  Elevated toilet seat or a handicapped toilet? No   TIMED UP AND GO:  Was the test performed? Yes .  Length of time to ambulate 10 feet: 5 sec.   Gait steady and fast without use of  assistive device  Cognitive Function:        10/20/2022   11:48 AM 11/07/2021    9:51 AM  6CIT Screen  What Year? 0 points 0 points  What month? 0 points 0 points  What time? 0 points 0 points  Count back from 20 0 points 0 points  Months in reverse 0 points 0 points  Repeat phrase 2 points 4 points  Total Score 2 points 4 points    Immunizations Immunization History  Administered Date(s) Administered   Fluad Quad(high Dose 65+) 01/25/2019, 03/06/2020, 03/05/2021, 02/16/2022   Influenza Split 03/18/2001, 03/17/2002, 03/18/2005, 02/25/2007, 02/10/2010, 01/29/2011, 02/15/2012   Influenza, High Dose Seasonal PF 03/13/2013, 02/28/2014, 02/06/2015, 02/21/2016, 02/08/2017, 03/08/2018   PFIZER Comirnaty(Gray Top)Covid-19 Tri-Sucrose Vaccine 03/03/2022   PFIZER(Purple Top)SARS-COV-2 Vaccination 07/27/2019, 08/21/2019, 04/23/2020   Pfizer Covid-19 Vaccine Bivalent Booster 28yrs & up 03/20/2021   Pneumococcal Conjugate-13 11/11/2015   Pneumococcal Polysaccharide-23 03/26/2005, 03/24/2007   Td 06/28/1981, 03/26/2005   Tdap 11/12/2015, 05/01/2018   Zoster, Live 05/30/2010    TDAP status: Up to date  Flu Vaccine status: Up to date  Pneumococcal vaccine status: Up to date  Covid-19 vaccine status: Completed vaccines  Qualifies for Shingles Vaccine? Yes   Zostavax completed Yes   Shingrix Completed?: No.    Education has been provided regarding the importance of this vaccine. Patient has been advised to call insurance company to determine out of pocket expense if they have not yet received this vaccine. Advised may also receive vaccine at local pharmacy or Health Dept. Verbalized acceptance and understanding.  Screening Tests Health Maintenance  Topic Date Due   Zoster Vaccines- Shingrix (1 of 2) Never done   COVID-19 Vaccine (6 - 2023-24 season) 04/28/2022   Medicare Annual Wellness (AWV)  11/08/2022   Pneumonia Vaccine 17+ Years old (3 of 3 - PPSV23 or PCV20) 09/15/2023  (Originally 11/10/2020)   INFLUENZA VACCINE  12/17/2022   DTaP/Tdap/Td (5 - Td or Tdap) 05/01/2028   Hepatitis C Screening  Completed   HPV VACCINES  Aged Out   Colonoscopy  Discontinued   Fecal DNA (Cologuard)  Discontinued    Health Maintenance  Health Maintenance Due  Topic Date Due   Zoster Vaccines- Shingrix (1 of 2) Never done   COVID-19 Vaccine (6 - 2023-24 season) 04/28/2022   Medicare Annual Wellness (AWV)  11/08/2022    Colorectal cancer screening: No longer required.   Lung Cancer Screening: (Low Dose CT Chest recommended if Age 63-80 years, 30 pack-year currently smoking OR have quit w/in 15years.) does not qualify.   Lung Cancer Screening Referral: no  Additional Screening:  Hepatitis C Screening: does qualify; Completed 11/11/2015  Vision Screening: Recommended annual ophthalmology exams for early detection of glaucoma and other disorders of the eye. Is the patient up to date with their annual eye exam?  No  Who is the provider or what is the name of the office in which the patient attends annual eye exams? [facility]  If pt is not established with a provider, would they like to be referred to a provider to establish care? No .   Dental Screening: Recommended annual dental exams for proper oral hygiene  Community Resource Referral / Chronic Care Management: CRR required this visit?  No   CCM required this visit?  No      Plan:     I have personally reviewed and noted the following in the patient's chart:   Medical and social history Use of alcohol, tobacco or illicit drugs  Current medications and supplements including opioid prescriptions. Patient is not currently taking opioid prescriptions. Functional ability and status Nutritional status Physical activity Advanced directives List of other physicians Hospitalizations, surgeries, and ER visits in previous 12 months Vitals Screenings to include cognitive, depression, and falls Referrals and  appointments  In addition, I have reviewed and discussed with patient certain preventive protocols, quality metrics, and best practice recommendations. A written personalized care plan for preventive services as well as general preventive health recommendations were provided to patient.     Barb Merino, LPN   4/0/9811   Nurse Notes: none

## 2022-11-30 ENCOUNTER — Ambulatory Visit: Payer: Medicare Other | Admitting: Family Medicine

## 2022-12-01 ENCOUNTER — Telehealth: Payer: Self-pay | Admitting: Family Medicine

## 2022-12-01 NOTE — Telephone Encounter (Signed)
I have been unable to get any samples of Livalo for patient,  I called the manufacture & samples are no longer available and also there is no longer a patient assistance program.  I checked Good Rx & Livalo is still very expensive.  There is an alternative at [facility] drug, Zypitamag (Pitavastatin) which is FDA approved alternative to Livalo.  Cost for 4mg  #30 with free shipping for $34 a month.  Also they are offering a free 30 day trial to make sure patients tolerate the change.  Is this ok to switch?

## 2022-12-10 MED ORDER — ZYPITAMAG 4 MG PO TABS: 4.0000 mg | ORAL_TABLET | Freq: Every day | ORAL | 3 refills | Status: DC

## 2022-12-10 NOTE — Telephone Encounter (Signed)
Called into [facility] t# [phone] all info #30/3 they will ship first 30 days for free.  He should get within 3-5 business days.

## 2022-12-23 ENCOUNTER — Encounter: Payer: Self-pay | Admitting: Family Medicine

## 2022-12-23 ENCOUNTER — Ambulatory Visit (INDEPENDENT_AMBULATORY_CARE_PROVIDER_SITE_OTHER): Payer: Medicare Other | Admitting: Family Medicine

## 2022-12-23 VITALS — BP 122/72 | HR 76 | Ht 67.0 in | Wt 158.8 lb

## 2022-12-23 DIAGNOSIS — I10 Essential (primary) hypertension: Secondary | ICD-10-CM | POA: Diagnosis not present

## 2022-12-23 DIAGNOSIS — Z8249 Family history of ischemic heart disease and other diseases of the circulatory system: Secondary | ICD-10-CM

## 2022-12-23 DIAGNOSIS — L989 Disorder of the skin and subcutaneous tissue, unspecified: Secondary | ICD-10-CM

## 2022-12-23 DIAGNOSIS — I2510 Atherosclerotic heart disease of native coronary artery without angina pectoris: Secondary | ICD-10-CM | POA: Diagnosis not present

## 2022-12-23 DIAGNOSIS — G720 Drug-induced myopathy: Secondary | ICD-10-CM

## 2022-12-23 DIAGNOSIS — M1990 Unspecified osteoarthritis, unspecified site: Secondary | ICD-10-CM | POA: Diagnosis not present

## 2022-12-23 DIAGNOSIS — I6523 Occlusion and stenosis of bilateral carotid arteries: Secondary | ICD-10-CM

## 2022-12-23 DIAGNOSIS — C099 Malignant neoplasm of tonsil, unspecified: Secondary | ICD-10-CM

## 2022-12-23 DIAGNOSIS — C4441 Basal cell carcinoma of skin of scalp and neck: Secondary | ICD-10-CM

## 2022-12-23 DIAGNOSIS — N401 Enlarged prostate with lower urinary tract symptoms: Secondary | ICD-10-CM | POA: Diagnosis not present

## 2022-12-23 DIAGNOSIS — Z87891 Personal history of nicotine dependence: Secondary | ICD-10-CM

## 2022-12-23 DIAGNOSIS — E781 Pure hyperglyceridemia: Secondary | ICD-10-CM

## 2022-12-23 DIAGNOSIS — Z85818 Personal history of malignant neoplasm of other sites of lip, oral cavity, and pharynx: Secondary | ICD-10-CM

## 2022-12-23 DIAGNOSIS — K219 Gastro-esophageal reflux disease without esophagitis: Secondary | ICD-10-CM

## 2022-12-23 DIAGNOSIS — Z0000 Encounter for general adult medical examination without abnormal findings: Secondary | ICD-10-CM

## 2022-12-23 DIAGNOSIS — N1831 Chronic kidney disease, stage 3a: Secondary | ICD-10-CM

## 2022-12-23 LAB — COMPREHENSIVE METABOLIC PANEL
ALT: 13 IU/L (ref 0–44)
AST: 25 IU/L (ref 0–40)
Albumin: 4.6 g/dL (ref 3.8–4.8)
Alkaline Phosphatase: 64 IU/L (ref 44–121)
BUN/Creatinine Ratio: 13 (ref 10–24)
BUN: 17 mg/dL (ref 8–27)
Bilirubin Total: 0.3 mg/dL (ref 0.0–1.2)
CO2: 20 mmol/L (ref 20–29)
Calcium: 10.1 mg/dL (ref 8.6–10.2)
Chloride: 106 mmol/L (ref 96–106)
Creatinine, Ser: 1.31 mg/dL — ABNORMAL HIGH (ref 0.76–1.27)
Globulin, Total: 2.7 g/dL (ref 1.5–4.5)
Glucose: 99 mg/dL (ref 70–99)
Potassium: 4.7 mmol/L (ref 3.5–5.2)
Sodium: 143 mmol/L (ref 134–144)
Total Protein: 7.3 g/dL (ref 6.0–8.5)
eGFR: 56 mL/min/{1.73_m2} — ABNORMAL LOW (ref >59)

## 2022-12-23 LAB — CBC WITH DIFFERENTIAL/PLATELET
Basophils Absolute: 0 10*3/uL (ref 0.0–0.2)
Basos: 1 %
EOS (ABSOLUTE): 0.1 10*3/uL (ref 0.0–0.4)
Eos: 1 %
Hematocrit: 46.2 % (ref 37.5–51.0)
Hemoglobin: 15.2 g/dL (ref 13.0–17.7)
Immature Grans (Abs): 0 10*3/uL (ref 0.0–0.1)
Immature Granulocytes: 0 %
Lymphocytes Absolute: 1.2 10*3/uL (ref 0.7–3.1)
Lymphs: 16 %
MCH: 30.5 pg (ref 26.6–33.0)
MCHC: 32.9 g/dL (ref 31.5–35.7)
MCV: 93 fL (ref 79–97)
Monocytes Absolute: 0.8 10*3/uL (ref 0.1–0.9)
Monocytes: 11 %
Neutrophils Absolute: 5.2 10*3/uL (ref 1.4–7.0)
Neutrophils: 71 %
Platelets: 295 10*3/uL (ref 150–450)
RBC: 4.99 x10E6/uL (ref 4.14–5.80)
RDW: 13 % (ref 11.6–15.4)
WBC: 7.3 10*3/uL (ref 3.4–10.8)

## 2022-12-23 LAB — LIPID PANEL
Chol/HDL Ratio: 3.5 ratio (ref 0.0–5.0)
Cholesterol, Total: 146 mg/dL (ref 100–199)
HDL: 42 mg/dL (ref >39)
LDL Chol Calc (NIH): 79 mg/dL (ref 0–99)
Triglycerides: 142 mg/dL (ref 0–149)
VLDL Cholesterol Cal: 25 mg/dL (ref 5–40)

## 2022-12-23 MED ORDER — METOPROLOL TARTRATE 25 MG PO TABS
ORAL_TABLET | ORAL | 3 refills | Status: DC
Start: 2022-12-23 — End: 2024-01-06

## 2022-12-23 MED ORDER — AMLODIPINE BESYLATE 5 MG PO TABS
5.0000 mg | ORAL_TABLET | Freq: Every day | ORAL | 3 refills | Status: DC
Start: 2022-12-23 — End: 2024-01-06

## 2022-12-23 MED ORDER — ZYPITAMAG 4 MG PO TABS
4.0000 mg | ORAL_TABLET | Freq: Every day | ORAL | 3 refills | Status: DC
Start: 2022-12-23 — End: 2023-04-19

## 2022-12-23 MED ORDER — FENOFIBRATE 160 MG PO TABS
ORAL_TABLET | ORAL | 3 refills | Status: DC
Start: 2022-12-23 — End: 2024-01-06

## 2022-12-23 MED ORDER — EZETIMIBE 10 MG PO TABS
10.0000 mg | ORAL_TABLET | Freq: Every day | ORAL | 3 refills | Status: DC
Start: 2022-12-23 — End: 2024-01-06

## 2022-12-23 NOTE — Progress Notes (Deleted)
Complete physical exam  Patient: Matthew Moreno   DOB: June 04, 1945   77 y.o. Male  MRN: [id number]  Subjective:    Chief Complaint  Patient presents with   Annual Exam    Fasting cpe, had AWV 10/2022, no concerns    Matthew Moreno is a 77 y.o. male who presents today for a complete physical exam.  He reports consuming a {diet types:17450} diet. {types:19826} He generally feels {DESC; WELL/FAIRLY WELL/POORLY:18703}. He reports sleeping {DESC; WELL/FAIRLY WELL/POORLY:18703}.  Most recent fall risk assessment:    10/20/2022   11:46 AM  Fall Risk   Falls in the past year? 0  Number falls in past yr: 0  Injury with Fall? 0  Risk for fall due to : Medication side effect  Follow up Falls prevention discussed;Education provided;Falls evaluation completed     Most recent depression screenings:    10/20/2022   11:47 AM 11/07/2021    9:49 AM  PHQ 2/9 Scores  PHQ - 2 Score 0 0  PHQ- 9 Score 0 0    {VISON DENTAL STD PSA (Optional):27386}  {History (Optional):23778}  Immunization History  Administered Date(s) Administered   Fluad Quad(high Dose 65+) 01/25/2019, 03/06/2020, 03/05/2021, 02/16/2022   Influenza Split 03/18/2001, 03/17/2002, 03/18/2005, 02/25/2007, 02/10/2010, 01/29/2011, 02/15/2012   Influenza, High Dose Seasonal PF 03/13/2013, 02/28/2014, 02/06/2015, 02/21/2016, 02/08/2017, 03/08/2018   PFIZER Comirnaty(Gray Top)Covid-19 Tri-Sucrose Vaccine 03/03/2022   PFIZER(Purple Top)SARS-COV-2 Vaccination 07/27/2019, 08/21/2019, 04/23/2020   Pfizer Covid-19 Vaccine Bivalent Booster 25yrs & up 03/20/2021   Pneumococcal Conjugate-13 11/11/2015   Pneumococcal Polysaccharide-23 03/26/2005, 03/24/2007   Td 06/28/1981, 03/26/2005   Tdap 11/12/2015, 05/01/2018   Zoster, Live 05/30/2010    Health Maintenance  Topic Date Due   Zoster Vaccines- Shingrix (1 of 2) 04/29/1965   COVID-19 Vaccine (6 - 2023-24 season) 04/28/2022   INFLUENZA VACCINE  12/17/2022   Pneumonia Vaccine 40+  Years old (3 of 3 - PPSV23 or PCV20) 09/15/2023 (Originally 11/10/2020)   Medicare Annual Wellness (AWV)  10/20/2023   DTaP/Tdap/Td (5 - Td or Tdap) 05/01/2028   Hepatitis C Screening  Completed   HPV VACCINES  Aged Out   Colonoscopy  Discontinued   Fecal DNA (Cologuard)  Discontinued    Patient Care Team: Ronnald Nian, MD as PCP - General (Family Medicine) Lonie Peak, MD as Attending Physician (Radiation Oncology) Barrie Folk, RN (Inactive) as Oncology Nurse Navigator   Outpatient Medications Prior to Visit  Medication Sig   ALPRAZolam (XANAX) 0.5 MG tablet Take 1 tablet (0.5 mg total) by mouth at bedtime as needed.   amLODipine (NORVASC) 5 MG tablet Take 1 tablet (5 mg total) by mouth daily.   aspirin 81 MG tablet Take 81 mg by mouth daily.   ezetimibe (ZETIA) 10 MG tablet Take 1 tablet (10 mg total) by mouth daily.   fenofibrate 160 MG tablet TAKE 1 TABLET(160 MG) BY MOUTH DAILY   metoprolol tartrate (LOPRESSOR) 25 MG tablet 1/2 tablet twice a day   omeprazole (PRILOSEC) 20 MG capsule Take 20 mg by mouth daily.   Pitavastatin Magnesium (ZYPITAMAG) 4 MG TABS Take 1 tablet (4 mg total) by mouth daily.   sodium fluoride (FLUORISHIELD) 1.1 % GEL dental gel Instill 1 drop into each tooth space of fluoride tray. Place over teeth for 5 minutes. Remove. Spit out excess. Repeat nightly.   VENTOLIN HFA 108 (90 Base) MCG/ACT inhaler INHALE 2 PUFFS INTO THE LUNGS EVERY 6 HOURS AS NEEDED FOR WHEEZING OR SHORTNESS OF BREATH  Facility-Administered Medications Prior to Visit  Medication Dose Route Frequency Provider   influenza  inactive virus vaccine (FLUZONE/FLUARIX) injection 0.5 mL  0.5 mL Intramuscular Once Ronnald Nian, MD    ROS  Family and social history as well as health maintenance and immunizations was reviewed.     Objective:    There were no vitals taken for this visit. {Vitals History (Optional):23777}  Physical Exam   No results found for any visits on  12/23/22. {Show previous labs (optional):23779}    Assessment & Plan:    Discussed health benefits of physical activity, and encouraged him to engage in regular exercise appropriate for his age and condition.  Routine general medical examination at a health care facility  Arthritis  Basal cell carcinoma of scalp  Benign prostatic hyperplasia with incomplete bladder emptying  Coronary artery disease involving native coronary artery of native heart without angina pectoris  Essential hypertension  Former smoker  Family history of heart disease  Gastroesophageal reflux disease, unspecified whether esophagitis present  Stage 3a chronic kidney disease (HCC)  Hypertriglyceridemia  Statin myopathy  Tonsil cancer (HCC)   No follow-ups on file.      Herminio Commons a, CMA

## 2022-12-23 NOTE — Progress Notes (Signed)
Complete physical exam  Patient: Matthew Moreno   DOB: 04/07/1946   77 y.o. Male  MRN: [id number]  Subjective:    Chief Complaint  Patient presents with   Annual Exam    Fasting cpe, had AWV 10/2022, no concerns    Matthew Moreno is a 77 y.o. male who presents today for a complete physical exam.  He reports consuming a regular diet.  He generally feels  He reports sleeping well.  Does have several skin lesions he would like me to look at.  He has had previous difficulty with basal cell cancers of his skin.  He has a history of BPH but is having no present symptoms from that.  He does follow-up with cardiology concerning his CAD as well as hyperlipidemia.  He does have statin intolerance but seem to be doing well on Livalo.  He follows up with cardiovascular surgery with his bilateral carotid stenosis.  Presently he is having no symptoms.  Reflux seem to be doing well with PPI twice per week.  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  Otherwise he has no other concerns or complaints.  Most recent fall risk assessment:    10/20/2022   11:46 AM  Fall Risk   Falls in the past year? 0  Number falls in past yr: 0  Injury with Fall? 0  Risk for fall due to : Medication side effect  Follow up Falls prevention discussed;Education provided;Falls evaluation completed     Most recent depression screenings:    10/20/2022   11:47 AM 11/07/2021    9:49 AM  PHQ 2/9 Scores  PHQ - 2 Score 0 0  PHQ- 9 Score 0 0        Immunization History  Administered Date(s) Administered   Fluad Quad(high Dose 65+) 01/25/2019, 03/06/2020, 03/05/2021, 02/16/2022   Influenza Split 03/18/2001, 03/17/2002, 03/18/2005, 02/25/2007, 02/10/2010, 01/29/2011, 02/15/2012   Influenza, High Dose Seasonal PF 03/13/2013, 02/28/2014, 02/06/2015, 02/21/2016, 02/08/2017, 03/08/2018   PFIZER Comirnaty(Gray Top)Covid-19 Tri-Sucrose Vaccine 03/03/2022   PFIZER(Purple Top)SARS-COV-2 Vaccination 07/27/2019,  08/21/2019, 04/23/2020   Pfizer Covid-19 Vaccine Bivalent Booster 33yrs & up 03/20/2021   Pneumococcal Conjugate-13 11/11/2015   Pneumococcal Polysaccharide-23 03/26/2005, 03/24/2007   Td 06/28/1981, 03/26/2005   Tdap 11/12/2015, 05/01/2018   Zoster, Live 05/30/2010    Health Maintenance  Topic Date Due   Zoster Vaccines- Shingrix (1 of 2) 04/29/1965   COVID-19 Vaccine (6 - 2023-24 season) 04/28/2022   INFLUENZA VACCINE  12/17/2022   Pneumonia Vaccine 7+ Years old (3 of 3 - PPSV23 or PCV20) 09/15/2023 (Originally 11/10/2020)   Medicare Annual Wellness (AWV)  10/20/2023   DTaP/Tdap/Td (5 - Td or Tdap) 05/01/2028   Hepatitis C Screening  Completed   HPV VACCINES  Aged Out   Colonoscopy  Discontinued   Fecal DNA (Cologuard)  Discontinued    Patient Care Team: Ronnald Nian, MD as PCP - General (Family Medicine) Lonie Peak, MD as Attending Physician (Radiation Oncology) Barrie Folk, RN (Inactive) as Oncology Nurse Navigator   Outpatient Medications Prior to Visit  Medication Sig   ALPRAZolam (XANAX) 0.5 MG tablet Take 1 tablet (0.5 mg total) by mouth at bedtime as needed.   aspirin 81 MG tablet Take 81 mg by mouth daily.   omeprazole (PRILOSEC) 20 MG capsule Take 20 mg by mouth daily.   sodium fluoride (FLUORISHIELD) 1.1 % GEL dental gel Instill 1 drop into each tooth space of fluoride tray. Place over teeth for 5 minutes.  Remove. Spit out excess. Repeat nightly.   VENTOLIN HFA 108 (90 Base) MCG/ACT inhaler INHALE 2 PUFFS INTO THE LUNGS EVERY 6 HOURS AS NEEDED FOR WHEEZING OR SHORTNESS OF BREATH   [DISCONTINUED] amLODipine (NORVASC) 5 MG tablet Take 1 tablet (5 mg total) by mouth daily.   [DISCONTINUED] ezetimibe (ZETIA) 10 MG tablet Take 1 tablet (10 mg total) by mouth daily.   [DISCONTINUED] fenofibrate 160 MG tablet TAKE 1 TABLET(160 MG) BY MOUTH DAILY   [DISCONTINUED] metoprolol tartrate (LOPRESSOR) 25 MG tablet 1/2 tablet twice a day   [DISCONTINUED] Pitavastatin  Magnesium (ZYPITAMAG) 4 MG TABS Take 1 tablet (4 mg total) by mouth daily.   Facility-Administered Medications Prior to Visit  Medication Dose Route Frequency Provider   influenza  inactive virus vaccine (FLUZONE/FLUARIX) injection 0.5 mL  0.5 mL Intramuscular Once Ronnald Nian, MD    Review of Systems  All other systems reviewed and are negative.   Family and social history as well as health maintenance and immunizations was reviewed.     Objective:    BP 122/72   Pulse 76   Ht 5\' 7"  (1.702 m)   Wt 158 lb 12.8 oz (72 kg)   BMI 24.87 kg/m    Physical Exam  Alert and in no distress. Tympanic membranes and canals are normal. Pharyngeal area is normal. Neck is supple without adenopathy or thyromegaly. Cardiac exam shows a regular sinus rhythm without murmurs or gallops. Lungs are clear to auscultation. He has a 1 and half centimeter vascular lesion on his right supraclavicular area and another area that is slightly erythematous and raised on the left forearm.     Assessment & Plan:    Routine general medical examination at a health care facility - Plan: CBC with Differential/Platelet, Comprehensive metabolic panel, Lipid panel  Arthritis  Basal cell carcinoma of scalp  Benign prostatic hyperplasia with incomplete bladder emptying  Coronary artery disease involving native coronary artery of native heart without angina pectoris - Plan: CBC with Differential/Platelet, Comprehensive metabolic panel, Lipid panel, metoprolol tartrate (LOPRESSOR) 25 MG tablet, fenofibrate 160 MG tablet, ezetimibe (ZETIA) 10 MG tablet  Essential hypertension - Plan: CBC with Differential/Platelet, Comprehensive metabolic panel, metoprolol tartrate (LOPRESSOR) 25 MG tablet, amLODipine (NORVASC) 5 MG tablet  Former smoker  Family history of heart disease - Plan: Pitavastatin Magnesium (ZYPITAMAG) 4 MG TABS  Gastroesophageal reflux disease, unspecified whether esophagitis present  Stage 3a  chronic kidney disease (HCC)  Hypertriglyceridemia - Plan: Lipid panel, Pitavastatin Magnesium (ZYPITAMAG) 4 MG TABS, fenofibrate 160 MG tablet, ezetimibe (ZETIA) 10 MG tablet  Statin myopathy  History of cancer tonsil  Carotid stenosis, asymptomatic, bilateral  Skin lesion - Plan: Ambulatory referral to Dermatology   Continue on present medication.  I will refer to dermatology as the lesion in the supraclavicular area does appear to be cancerous and the other 1 certainly could be.  Otherwise he will continue on his present medication regimen.   Sharlot Gowda, MD

## 2022-12-24 DIAGNOSIS — L570 Actinic keratosis: Secondary | ICD-10-CM | POA: Diagnosis not present

## 2022-12-24 DIAGNOSIS — D045 Carcinoma in situ of skin of trunk: Secondary | ICD-10-CM | POA: Diagnosis not present

## 2022-12-24 DIAGNOSIS — L821 Other seborrheic keratosis: Secondary | ICD-10-CM | POA: Diagnosis not present

## 2022-12-24 DIAGNOSIS — L723 Sebaceous cyst: Secondary | ICD-10-CM | POA: Diagnosis not present

## 2022-12-24 DIAGNOSIS — Z85828 Personal history of other malignant neoplasm of skin: Secondary | ICD-10-CM | POA: Diagnosis not present

## 2022-12-24 DIAGNOSIS — L812 Freckles: Secondary | ICD-10-CM | POA: Diagnosis not present

## 2023-01-06 DIAGNOSIS — D049 Carcinoma in situ of skin, unspecified: Secondary | ICD-10-CM | POA: Insufficient documentation

## 2023-01-11 NOTE — Progress Notes (Unsigned)
[facility] [place]  ASSESSMENT / PLAN: 77 y.o. male with asymptomatic bilateral (left 60-79%, right 50 to 69%) carotid artery stenosis.    The patient should continue best medical therapy for carotid artery stenosis including: Complete cessation from all tobacco products. Blood glucose control with goal A1c < 7%. Blood pressure control with goal blood pressure < 140/90 mmHg. Lipid reduction therapy with goal LDL-C <100 mg/dL  Aspirin 81mg  PO QD.  Atorvastatin 40-80mg  PO QD (or other "high intensity" statin therapy).   Stable stenoses on duplex today. Will see him again in 6 months with repeat duplex  CHIEF COMPLAINT: ***  HISTORY OF PRESENT ILLNESS: Matthew Moreno is a 77 y.o. male who presents to clinic for discussion of recent carotid duplex that showed carotid artery stenosis bilaterally.  The patient reports the carotid duplex was precipitated by intermittent dizziness associated with positional changes of the head.  Patient reports no focal symptoms.  He denies amaurosis, facial droop, dysarthria, unilateral weakness or numbness.  He has never had a stroke or mini stroke before.   07/14/22: Returns to clinic for surveillance.  He is doing well overall.  No focal neurologic symptoms.  We reviewed his duplex today.  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 Langston Reusing)   COLONOSCOPY  2010   Dr. Christella Hartigan   ESOPHAGOGASTRODUODENOSCOPY N/A 09/04/2018   Procedure: ESOPHAGOGASTRODUODENOSCOPY (EGD);  Surgeon: Jeani Hawking, MD;  Location: [facility] ENDOSCOPY;  Service: Endoscopy;  Laterality: N/A;   EYE SURGERY Bilateral    Cataract   FOREIGN BODY REMOVAL  09/04/2018   Procedure: FOREIGN BODY REMOVAL;  Surgeon: Jeani Hawking, MD;  Location: [facility] ENDOSCOPY;  Service: Endoscopy;;   SKIN BIOPSY Right 12/15/2021   basal cell carcinoma of the scalp    Family History  Problem Relation Age of Onset   Heart Problems Mother    Heart Problems Father     Social History   Socioeconomic History   Marital status: Widowed    Spouse name: Not on file   Number of children: 2   Years of education: Not on file   Highest education level: Not on file  Occupational History   Occupation: pipe fitter    Employer: RETIRED    Comment: Retired  Tobacco Use   Smoking status: Former    Current packs/day: 0.00    Average packs/day: 0.8 packs/day for 20.0 years (15.0 ttl pk-yrs)    Types:  Cigarettes    Start date: 11/02/1963    Quit date: 11/02/1983    Years since quitting: 39.2   Smokeless tobacco: Never   Tobacco comments:    Stopped Smoking ~ 20 years ago  Vaping Use   Vaping status: Never Used  Substance and Sexual Activity   Alcohol use: Not Currently    Alcohol/week: 3.0 standard drinks of alcohol    Types: 3 Cans of beer per week    Comment: 04/08/18 3 beers 2-3 night a week.    Drug use: No   Sexual activity: Not Currently  Other Topics Concern   Not on file  Social History Narrative   Patient is widowed. He had one son  and one daughter. The son passed away from Tylenol toxicity.   Social Determinants of Health   Financial Resource Strain: Low Risk  (10/20/2022)   Overall Financial Resource Strain (CARDIA)    Difficulty of Paying Living Expenses: Not hard at all  Food Insecurity: No Food Insecurity (10/20/2022)   Hunger Vital Sign    Worried About Running Out of Food in the Last Year: Never true    Ran Out of Food in the Last Year: Never true  Transportation Needs: No Transportation Needs (10/20/2022)   PRAPARE - Administrator, Civil Service (Medical): No    Lack of Transportation (Non-Medical): No  Physical Activity: Sufficiently Active (10/20/2022)   Exercise Vital Sign    Days of Exercise per Week: 7 days    Minutes of Exercise per Session: 30 min  Stress: No Stress Concern Present (10/20/2022)   [employer] of Occupational Health - Occupational Stress Questionnaire    Feeling of Stress : Not at all  Social Connections: Not on file  Intimate Partner Violence: Not At Risk (04/08/2018)   Humiliation, Afraid, Rape, and Kick questionnaire    Fear of Current or Ex-Partner: No    Emotionally Abused: No    Physically Abused: No    Sexually Abused: No    Allergies  Allergen Reactions   Bystolic [Nebivolol Hcl]    Lipitor [Atorvastatin] Other (See Comments)    Muscle aches with statins   Lisinopril    Pravastatin    Red Yeast Rice [Cholestin]    Sulfonamide Derivatives     REACTION: hives   Zocor [Simvastatin]     Current Outpatient Medications  Medication Sig Dispense Refill   ALPRAZolam (XANAX) 0.5 MG tablet Take 1 tablet (0.5 mg total) by mouth at bedtime as needed. 30 tablet 1   amLODipine (NORVASC) 5 MG tablet Take 1 tablet (5 mg total) by mouth daily. 90 tablet 3   aspirin 81 MG tablet Take 81 mg by mouth daily.     ezetimibe (ZETIA) 10 MG tablet Take 1 tablet (10 mg total) by mouth daily. 90 tablet 3   fenofibrate 160 MG tablet TAKE 1 TABLET(160 MG) BY MOUTH DAILY 90 tablet  3   metoprolol tartrate (LOPRESSOR) 25 MG tablet 1/2 tablet twice a day 90 tablet 3   omeprazole (PRILOSEC) 20 MG capsule Take 20 mg by mouth daily.     Pitavastatin Magnesium (ZYPITAMAG) 4 MG TABS Take 1 tablet (4 mg total) by mouth daily. 30 tablet 3   sodium fluoride (FLUORISHIELD) 1.1 % GEL dental gel Instill 1 drop into each tooth space of fluoride tray. Place over teeth for 5 minutes. Remove. Spit out excess. Repeat nightly. 120 mL prn   VENTOLIN HFA 108 (90 Base) MCG/ACT inhaler INHALE 2 PUFFS  INTO THE LUNGS EVERY 6 HOURS AS NEEDED FOR WHEEZING OR SHORTNESS OF BREATH 18 g 0   No current facility-administered medications for this visit.   Facility-Administered Medications Ordered in Other Visits  Medication Dose Route Frequency Provider Last Rate Last Admin   influenza  inactive virus vaccine (FLUZONE/FLUARIX) injection 0.5 mL  0.5 mL Intramuscular Once Ronnald Nian, MD        PHYSICAL EXAM There were no vitals filed for this visit.  Constitutional: *** appearing. *** distress. Appears *** nourished.  Neurologic: CN ***. *** focal findings. *** sensory loss. Psychiatric: *** Mood and affect symmetric and appropriate. Eyes: *** No icterus. No conjunctival pallor. Ears, nose, throat: *** mucous membranes moist. Midline trachea.  Cardiac: *** rate and rhythm.  Respiratory: *** unlabored. Abdominal: *** soft, non-tender, non-distended.  Peripheral vascular: *** Extremity: *** edema. *** cyanosis. *** pallor.  Skin: *** gangrene. *** ulceration.  Lymphatic: *** Stemmer's sign. *** palpable lymphadenopathy.    PERTINENT LABORATORY AND RADIOLOGIC DATA  Most recent CBC    Latest Ref Rng & Units 12/23/2022   10:19 AM 11/24/2021    2:43 PM 07/18/2020    1:30 PM  CBC  WBC 3.4 - 10.8 x10E3/uL 7.3  7.2  6.5   Hemoglobin 13.0 - 17.7 g/dL 16.1  09.6  04.5   Hematocrit 37.5 - 51.0 % 46.2  44.4  45.3   Platelets 150 - 450 x10E3/uL 295  312  288      Most recent CMP    Latest Ref  Rng & Units 12/23/2022   10:19 AM 11/24/2021    2:43 PM 07/18/2020    1:30 PM  CMP  Glucose 70 - 99 mg/dL 99  92  78   BUN 8 - 27 mg/dL 17  13  14    Creatinine 0.76 - 1.27 mg/dL 4.09  8.11  9.14   Sodium 134 - 144 mmol/L 143  143  142   Potassium 3.5 - 5.2 mmol/L 4.7  4.8  4.9   Chloride 96 - 106 mmol/L 106  101  104   CO2 20 - 29 mmol/L 20  24  20    Calcium 8.6 - 10.2 mg/dL 78.2  95.6  21.3   Total Protein 6.0 - 8.5 g/dL 7.3  7.7  7.8   Total Bilirubin 0.0 - 1.2 mg/dL 0.3  0.4  0.4   Alkaline Phos 44 - 121 IU/L 64  61  67   AST 0 - 40 IU/L 25  17  33   ALT 0 - 44 IU/L 13  12  23      Renal function CrCl cannot be calculated (Unknown ideal weight.).  No results found for: "HGBA1C"  LDL Cholesterol (Calc)  Date Value Ref Range Status  03/30/2017 130 (H) mg/dL (calc) Final    Comment:    Reference range: <100 . Desirable range <100 mg/dL for primary prevention;   <70 mg/dL for patients with CHD or diabetic patients  with > or = 2 CHD risk factors. Marland Kitchen LDL-C is now calculated using the Martin-Hopkins  calculation, which is a validated novel method providing  better accuracy than the Friedewald equation in the  estimation of LDL-C.  Horald Pollen et al. Lenox Ahr. 0865;784(69): 2061-2068  (http://education.[facility].com/faq/FAQ164)    LDL Chol Calc (NIH)  Date Value Ref Range Status  12/23/2022 79 0 - 99 mg/dL Final   Direct LDL  Date Value Ref Range Status  05/04/2016 67 <130 mg/dL Final    Comment:  Desirable range <100 mg/dL for patients with CHD or diabetes and <70 mg/dL for diabetic patients with known heart disease.      LDL Direct  Date Value Ref Range Status  03/07/2019 100 (H) 0 - 99 mg/dL Final     Vascular Imaging: ***  Curtiss Mahmood N. Lenell Antu, MD FACS [facility] Phone Number: [phone] 01/11/2023 10:41 AM   Total time spent on preparing this encounter including chart review, data review, collecting  history, examining the patient, coordinating care for this {[id number]}  Portions of this report may have been transcribed using voice recognition software.  Every effort has been made to ensure accuracy; however, inadvertent computerized transcription errors may still be present.

## 2023-01-12 ENCOUNTER — Other Ambulatory Visit: Payer: Self-pay | Admitting: Family Medicine

## 2023-01-12 ENCOUNTER — Ambulatory Visit (HOSPITAL_COMMUNITY)
Admission: RE | Admit: 2023-01-12 | Discharge: 2023-01-12 | Disposition: A | Discharge status: Home / Self Care | Payer: Medicare Other | Source: Ambulatory Visit | Attending: Vascular Surgery | Admitting: Vascular Surgery

## 2023-01-12 ENCOUNTER — Encounter: Payer: Self-pay | Admitting: Vascular Surgery

## 2023-01-12 ENCOUNTER — Ambulatory Visit: Payer: Medicare Other | Admitting: Vascular Surgery

## 2023-01-12 ENCOUNTER — Telehealth: Payer: Self-pay | Admitting: Family Medicine

## 2023-01-12 VITALS — BP 162/75 | HR 70 | Temp 98.4°F | Resp 20 | Ht 67.0 in | Wt 158.0 lb

## 2023-01-12 DIAGNOSIS — I6523 Occlusion and stenosis of bilateral carotid arteries: Secondary | ICD-10-CM

## 2023-01-12 DIAGNOSIS — E781 Pure hyperglyceridemia: Secondary | ICD-10-CM

## 2023-01-12 DIAGNOSIS — F419 Anxiety disorder, unspecified: Secondary | ICD-10-CM

## 2023-01-12 DIAGNOSIS — I2510 Atherosclerotic heart disease of native coronary artery without angina pectoris: Secondary | ICD-10-CM

## 2023-01-12 MED ORDER — ALPRAZOLAM 0.5 MG PO TABS
0.5000 mg | ORAL_TABLET | Freq: Every evening | ORAL | 1 refills | Status: DC | PRN
Start: 2023-01-12 — End: 2024-02-02

## 2023-01-12 NOTE — Telephone Encounter (Signed)
Pt requesting refill on xanx to [id number] - [place], [place] - [address] AT [facility] OF [address] & [address]

## 2023-01-29 ENCOUNTER — Other Ambulatory Visit: Payer: Self-pay

## 2023-01-29 DIAGNOSIS — I6523 Occlusion and stenosis of bilateral carotid arteries: Secondary | ICD-10-CM

## 2023-02-03 ENCOUNTER — Other Ambulatory Visit: Payer: Self-pay | Admitting: Family Medicine

## 2023-02-03 DIAGNOSIS — I2510 Atherosclerotic heart disease of native coronary artery without angina pectoris: Secondary | ICD-10-CM

## 2023-02-03 DIAGNOSIS — I10 Essential (primary) hypertension: Secondary | ICD-10-CM

## 2023-02-23 ENCOUNTER — Other Ambulatory Visit (INDEPENDENT_AMBULATORY_CARE_PROVIDER_SITE_OTHER): Payer: Medicare Other

## 2023-02-23 DIAGNOSIS — Z23 Encounter for immunization: Secondary | ICD-10-CM

## 2023-04-19 ENCOUNTER — Other Ambulatory Visit: Payer: Self-pay | Admitting: Family Medicine

## 2023-04-19 DIAGNOSIS — E781 Pure hyperglyceridemia: Secondary | ICD-10-CM

## 2023-04-19 DIAGNOSIS — Z8249 Family history of ischemic heart disease and other diseases of the circulatory system: Secondary | ICD-10-CM

## 2023-05-08 ENCOUNTER — Other Ambulatory Visit: Payer: Self-pay | Admitting: Family Medicine

## 2023-05-12 MED ORDER — ALBUTEROL SULFATE HFA 108 (90 BASE) MCG/ACT IN AERS: 2.0000 | INHALATION_SPRAY | Freq: Four times a day (QID) | RESPIRATORY_TRACT | 0 refills | Status: DC | PRN

## 2023-06-02 ENCOUNTER — Other Ambulatory Visit: Payer: Self-pay | Admitting: Family Medicine

## 2023-07-12 NOTE — Progress Notes (Unsigned)
 [facility] [place]  ASSESSMENT / PLAN: 78 y.o. male with asymptomatic bilateral (left 80-99%, right 60 to 79%) carotid artery stenosis.    The patient should continue best medical therapy for carotid artery stenosis including: Complete cessation from all tobacco products. Blood glucose control with goal A1c < 7%. Blood pressure control with goal blood pressure < 140/90 mmHg. Lipid reduction therapy with goal LDL-C <100 mg/dL  Aspirin 81mg  PO QD.  Atorvastatin 40-80mg  PO QD (or other "high intensity" statin therapy).   Left stenosis appears markedly worse today.  The technologist does note some twisting or kinking of the left which may be elevating the reading today.  Will check a CT angiogram of the head and neck and follow-up with me in 1 month.  CHIEF COMPLAINT: follow up carotid stenosis  HISTORY OF PRESENT ILLNESS: Matthew Moreno is a 78 y.o. male who presents to clinic for discussion of recent carotid duplex that showed carotid artery stenosis bilaterally.  The patient reports the carotid duplex was precipitated by intermittent dizziness associated with positional changes of the head.  Patient reports no focal symptoms.  He denies amaurosis, facial droop, dysarthria, unilateral weakness or numbness.  He has never had a stroke or mini stroke before.   07/14/22: Returns to clinic for surveillance.  He is doing well overall.  No focal neurologic symptoms.  We reviewed his duplex today.  01/12/23: returns to clinic for surveillance. Denies interval CVA/TIA or symptoms typical of same. We reviewed his duplex.  07/13/23: Doing well overall.  No interval stroke or mini stroke.  I did review his significant change in his duplex today with him in detail.  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 Langston Reusing)   COLONOSCOPY  2010   Dr. Christella Hartigan   ESOPHAGOGASTRODUODENOSCOPY N/A 09/04/2018   Procedure: ESOPHAGOGASTRODUODENOSCOPY (EGD);  Surgeon: Jeani Hawking, MD;  Location: [facility] ENDOSCOPY;  Service: Endoscopy;  Laterality: N/A;   EYE SURGERY Bilateral    Cataract   FOREIGN BODY REMOVAL  09/04/2018   Procedure: FOREIGN BODY REMOVAL;  Surgeon: Jeani Hawking, MD;  Location: [facility] ENDOSCOPY;  Service: Endoscopy;;   SKIN BIOPSY Right 12/15/2021   basal cell carcinoma of the scalp    Family History  Problem Relation Age of Onset   Heart Problems Mother    Heart Problems Father     Social History   Socioeconomic History   Marital status: Widowed    Spouse name:  Not on file   Number of children: 2   Years of education: Not on file   Highest education level: Not on file  Occupational History   Occupation: pipe fitter    Employer: RETIRED    Comment: Retired  Tobacco Use   Smoking status: Former    Current packs/day: 0.00    Average packs/day: 0.8 packs/day for 20.0 years (15.0 ttl pk-yrs)    Types: Cigarettes    Start date: 11/02/1963    Quit date: 11/02/1983    Years since quitting: 39.7   Smokeless tobacco: Never   Tobacco  comments:    Stopped Smoking ~ 20 years ago  Vaping Use   Vaping status: Never Used  Substance and Sexual Activity   Alcohol use: Not Currently    Alcohol/week: 3.0 standard drinks of alcohol    Types: 3 Cans of beer per week    Comment: 04/08/18 3 beers 2-3 night a week.    Drug use: No   Sexual activity: Not Currently  Other Topics Concern   Not on file  Social History Narrative   Patient is widowed. He had one son and one daughter. The son passed away from Tylenol toxicity.   Social Drivers of Corporate investment banker Strain: Low Risk  (10/20/2022)   Overall Financial Resource Strain (CARDIA)    Difficulty of Paying Living Expenses: Not hard at all  Food Insecurity: No Food Insecurity (10/20/2022)   Hunger Vital Sign    Worried About Running Out of Food in the Last Year: Never true    Ran Out of Food in the Last Year: Never true  Transportation Needs: No Transportation Needs (10/20/2022)   PRAPARE - Administrator, Civil Service (Medical): No    Lack of Transportation (Non-Medical): No  Physical Activity: Sufficiently Active (10/20/2022)   Exercise Vital Sign    Days of Exercise per Week: 7 days    Minutes of Exercise per Session: 30 min  Stress: No Stress Concern Present (10/20/2022)   [employer] of Occupational Health - Occupational Stress Questionnaire    Feeling of Stress : Not at all  Social Connections: Not on file  Intimate Partner Violence: Not At Risk (04/08/2018)   Humiliation, Afraid, Rape, and Kick questionnaire    Fear of Current or Ex-Partner: No    Emotionally Abused: No    Physically Abused: No    Sexually Abused: No    Allergies  Allergen Reactions   Bystolic [Nebivolol Hcl]    Lipitor [Atorvastatin] Other (See Comments)    Muscle aches with statins   Lisinopril    Pravastatin    Red Yeast Rice [Cholestin]    Sulfonamide Derivatives     REACTION: hives   Zocor [Simvastatin]     Current Outpatient Medications  Medication Sig  Dispense Refill   ALPRAZolam (XANAX) 0.5 MG tablet Take 1 tablet (0.5 mg total) by mouth at bedtime as needed. 30 tablet 1   amLODipine (NORVASC) 5 MG tablet Take 1 tablet (5 mg total) by mouth daily. 90 tablet 3   aspirin 81 MG tablet Take 81 mg by mouth daily.     ezetimibe (ZETIA) 10 MG tablet Take 1 tablet (10 mg total) by mouth daily. 90 tablet 3   fenofibrate 160 MG tablet TAKE 1 TABLET(160 MG) BY MOUTH DAILY 90 tablet 3   metoprolol tartrate (LOPRESSOR) 25 MG tablet 1/2 tablet twice a day 90 tablet 3   omeprazole (PRILOSEC) 20 MG capsule Take  20 mg by mouth daily.     Pitavastatin Magnesium (ZYPITAMAG) 4 MG TABS TAKE 1 TABLET BY MOUTH EVERY DAY 90 tablet 0   sodium fluoride (FLUORISHIELD) 1.1 % GEL dental gel Instill 1 drop into each tooth space of fluoride tray. Place over teeth for 5 minutes. Remove. Spit out excess. Repeat nightly. 120 mL prn   VENTOLIN HFA 108 (90 Base) MCG/ACT inhaler INHALE 2 PUFFS INTO THE LUNGS EVERY 6 HOURS AS NEEDED FOR WHEEZING OR SHORTNESS OF BREATH 18 g 0   No current facility-administered medications for this visit.   Facility-Administered Medications Ordered in Other Visits  Medication Dose Route Frequency Provider Last Rate Last Admin   influenza  inactive virus vaccine (FLUZONE/FLUARIX) injection 0.5 mL  0.5 mL Intramuscular Once Ronnald Nian, MD        PHYSICAL EXAM There were no vitals filed for this visit.   Well appearing elderly man Normal gait and station No extremity weakness  PERTINENT LABORATORY AND RADIOLOGIC DATA  Most recent CBC    Latest Ref Rng & Units 12/23/2022   10:19 AM 11/24/2021    2:43 PM 07/18/2020    1:30 PM  CBC  WBC 3.4 - 10.8 x10E3/uL 7.3  7.2  6.5   Hemoglobin 13.0 - 17.7 g/dL 19.1  47.8  29.5   Hematocrit 37.5 - 51.0 % 46.2  44.4  45.3   Platelets 150 - 450 x10E3/uL 295  312  288      Most recent CMP    Latest Ref Rng & Units 12/23/2022   10:19 AM 11/24/2021    2:43 PM 07/18/2020    1:30 PM  CMP  Glucose 70  - 99 mg/dL 99  92  78   BUN 8 - 27 mg/dL 17  13  14    Creatinine 0.76 - 1.27 mg/dL 6.21  3.08  6.57   Sodium 134 - 144 mmol/L 143  143  142   Potassium 3.5 - 5.2 mmol/L 4.7  4.8  4.9   Chloride 96 - 106 mmol/L 106  101  104   CO2 20 - 29 mmol/L 20  24  20    Calcium 8.6 - 10.2 mg/dL 84.6  96.2  95.2   Total Protein 6.0 - 8.5 g/dL 7.3  7.7  7.8   Total Bilirubin 0.0 - 1.2 mg/dL 0.3  0.4  0.4   Alkaline Phos 44 - 121 IU/L 64  61  67   AST 0 - 40 IU/L 25  17  33   ALT 0 - 44 IU/L 13  12  23      Renal function CrCl cannot be calculated (Patient's most recent lab result is older than the maximum 21 days allowed.).  No results found for: "HGBA1C"  LDL Cholesterol (Calc)  Date Value Ref Range Status  03/30/2017 130 (H) mg/dL (calc) Final    Comment:    Reference range: <100 . Desirable range <100 mg/dL for primary prevention;   <70 mg/dL for patients with CHD or diabetic patients  with > or = 2 CHD risk factors. Marland Kitchen LDL-C is now calculated using the Martin-Hopkins  calculation, which is a validated novel method providing  better accuracy than the Friedewald equation in the  estimation of LDL-C.  Horald Pollen et al. Lenox Ahr. 8413;244(01): 2061-2068  (http://education.[facility].com/faq/FAQ164)    LDL Chol Calc (NIH)  Date Value Ref Range Status  12/23/2022 79 0 - 99 mg/dL Final   Direct LDL  Date Value Ref Range Status  05/04/2016 67 <130 mg/dL Final    Comment:        Desirable range <100 mg/dL for patients with CHD or diabetes and <70 mg/dL for diabetic patients with known heart disease.      LDL Direct  Date Value Ref Range Status  03/07/2019 100 (H) 0 - 99 mg/dL Final    Right Carotid: Velocities in the right ICA are consistent with a 60-79%                 stenosis.   Left Carotid: Velocities in the left ICA are consistent with a 80-99%  stenosis.   Vertebrals: Bilateral vertebral arteries demonstrate antegrade flow.  Subclavians: Normal flow hemodynamics  were seen in bilateral subclavian               arteries.   Rande Brunt. Lenell Antu, MD FACS [facility] Phone Number: [phone] 07/12/2023 9:12 AM   Total time spent on preparing this encounter including chart review, data review, collecting history, examining the patient, coordinating care for this established patient, 30 minutes  Portions of this report may have been transcribed using voice recognition software.  Every effort has been made to ensure accuracy; however, inadvertent computerized transcription errors may still be present.

## 2023-07-13 ENCOUNTER — Encounter: Payer: Self-pay | Admitting: Vascular Surgery

## 2023-07-13 ENCOUNTER — Encounter: Payer: Self-pay | Admitting: Internal Medicine

## 2023-07-13 ENCOUNTER — Ambulatory Visit (HOSPITAL_COMMUNITY)
Admission: RE | Admit: 2023-07-13 | Discharge: 2023-07-13 | Disposition: A | Discharge status: Home / Self Care | Payer: Medicare Other | Source: Ambulatory Visit | Attending: Vascular Surgery | Admitting: Vascular Surgery

## 2023-07-13 ENCOUNTER — Ambulatory Visit: Payer: Medicare Other | Admitting: Vascular Surgery

## 2023-07-13 VITALS — BP 161/80 | HR 79 | Temp 98.3°F | Ht 67.0 in | Wt 163.1 lb

## 2023-07-13 DIAGNOSIS — I6523 Occlusion and stenosis of bilateral carotid arteries: Secondary | ICD-10-CM

## 2023-07-14 ENCOUNTER — Other Ambulatory Visit: Payer: Self-pay

## 2023-07-14 DIAGNOSIS — I6523 Occlusion and stenosis of bilateral carotid arteries: Secondary | ICD-10-CM

## 2023-07-28 ENCOUNTER — Encounter (HOSPITAL_BASED_OUTPATIENT_CLINIC_OR_DEPARTMENT_OTHER): Payer: Self-pay | Admitting: Internal Medicine

## 2023-07-28 ENCOUNTER — Ambulatory Visit (HOSPITAL_BASED_OUTPATIENT_CLINIC_OR_DEPARTMENT_OTHER): Payer: Medicare Other | Admitting: Internal Medicine

## 2023-07-28 VITALS — BP 148/76 | HR 91 | Ht 67.0 in | Wt 160.2 lb

## 2023-07-28 DIAGNOSIS — I2510 Atherosclerotic heart disease of native coronary artery without angina pectoris: Secondary | ICD-10-CM

## 2023-07-28 DIAGNOSIS — E785 Hyperlipidemia, unspecified: Secondary | ICD-10-CM

## 2023-07-28 DIAGNOSIS — I6523 Occlusion and stenosis of bilateral carotid arteries: Secondary | ICD-10-CM

## 2023-07-28 DIAGNOSIS — I10 Essential (primary) hypertension: Secondary | ICD-10-CM

## 2023-07-28 NOTE — Progress Notes (Signed)
 OFFICE NOTE  Chief Complaint:  No complaints  Primary Care Physician: Matthew Nian, MD  HPI:  Matthew Moreno is a 78 year old gentleman we are following for history of coronary disease, moderate, in 2010 on catheterization, 50% LAD, 40% RCA and 50% OM lesion. He also has hypertension difficult to control, dyslipidemia and intolerance to statins. He has returned today with worsening leg pain which now he is attributing to fenofibrate. His most recent lipid profile obtained on April 11, 2012, showed total cholesterol 241, triglycerides 217, LDL was 158, HDL 40. His triglycerides are actually down from about 400 last year with the fenofibrate so that seems to be working. Unfortunately, he was directed to take Livalo 2 mg daily last year at his last visit but did not start that and is now contemplating discontinuing the fenofibrate as he is concerned this is causing his muscle pain. Since his last visit, he has stopped both the little and fenofibrate. Prior to his last blood work, he went out with some friends and was drinking bourbon in his triglycerides were 1400. Therefore difficult to know what his LDL actually is without measuring a direct LDL. We discussed options as alternatives to statins including WelChol and his idea, however he is not interested in any further medications.  Matthew Moreno has no new complaints today. 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 He is undergone radiation therapy and is apparently clear of this. Unfortunately he was intolerant to the statins and cannot afford to take fenofibrate. He is currently not on any therapy for lipid lowering and does have moderate coronary artery disease. We have discussed this in the past and felt there were a few additional options. He does say that he lost 20 or 30 pounds recently due to his cancer and wonders if his cholesterol is improved.  01/10/2016  Matthew Moreno returns today for follow-up. He seems to be  doing fairly well although recently he's had some worsening shortness of breath and fatigue. This is mostly with exertion. 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 Several days he's had to go back home and go to sleep for the rest of the day. He does not associate this with the hot weather. He denies any chest pain with this but has been more fatigued recently. He is concern is a friend of his had some similar symptoms and underwent a bypass surgery. Matthew Moreno does have a history of moderate coronary artery disease by catheterization in 2010. He's had difficult to control cholesterol due to intolerance to statins but has been able to take Livalo. Recent lab work indicated much higher triglycerides been previously, in fact greater than 400 therefore LDL was not calculated, however LDL last year was 102.  01/11/2017  Matthew Moreno is seen today in follow-up. Overall he is asymptomatic. He was having some shortness of breath but that improved. We did a stress test last year which was negative for ischemia. He's gained about 15 pounds since we last saw him, he feels that he's eating last however gaining weight. He denies any swelling. He's not had any significant muscle mass. He generally eats a large "country" breakfast, usually a sandwich for lunch and very little if anything for dinner. He's had elevated triglycerides in the past and an LDL over 100. I started him on Livalo which she is tolerating, but is not made a significant improvement in triglycerides rather his LDL was lower in the  60s. Fasting blood glucose is been in the low 100s in the past but he is not known to be diabetic.  01/12/2018  Matthew Moreno is seen today in follow-up.  Overall he continues to be asymptomatic.  He denies any chest pain or worsening shortness of breath.  Weight has been stable.  His last lipid profile 9 months ago by his PCP was not a goal showing total cholesterol 221, HDL 42, triglycerides  345 and LDL 130.  Non-HDL cholesterol significantly elevated at 180 suggesting significant residual risk.  01/24/2019  Matthew Moreno is seen today in follow-up.  He denies any chest pain or worsening shortness of breath.  Blood pressure was elevated today 164/84 and a recheck came down to 142/74.  He says at home his blood pressure ranges from the low 130s up to 150s consistently.  He is only on half tablet of the metoprolol.  He does not think that that is making a significant effect on his blood pressure.  Cholesterol has not been reassessed recently and he is overdue for that.  He did have triglycerides checked in April which were elevated and remain persistently elevated.  He is possibly a good candidate for Vascepa about which we discussed today.  01/25/2020  Matthew Moreno is seen today in follow-up.  Overall he is feeling well.  However this past month was not so good.  He went on a trip apparently out to [place] with some friends and somebody had COVID-19.  He has been vaccinated fully with his last shot in April of the [employer] vaccine but did ultimately contract COVID-19.  He said he had 1 to 2 days of fever and ultimately felt that it might be going to his lungs and therefore got monoclonal antibody therapy through the [facility] outpatient clinic.  This worked almost immediately and he said he felt much better.  He is now out of quarantine.  Otherwise he is asymptomatic.  He did have lab work in February 2021 with his PCP showing total cholesterol 163, HDL 39, LDL 89 and triglycerides 207.  04/17/2021  Matthew Moreno returns today for follow-up.  Overall he says he feels well denies any chest pain or worsening shortness of breath.  Blood pressure was elevated today however he said did not take his blood pressure medicines this morning.  He reports that he had recently stopped taking his Livalo, probably earlier this year.  He had lipids in March which showed total cholesterol of 188, HDL 46, LDL 102 and  triglycerides 236, this is higher than ideal.  He then said that they actually decrease the dose to 2 mg from 4 mg and has been compliant with that dose, noting that he has had improvement in some leg pain that he was having.  He has not had his cholesterol reassessed since making that dose change.  07/10/2022  Matthew Moreno is seen today in follow-up.  Overall again he says he is doing well without any specific complaints.  His PCP had investigated carotid bruits and noted that he has some significant carotid stenosis.  Namely greater than 70% stenosis in the proximal right internal carotid artery and 50 to 69% stenosis in the left internal carotid artery.  He was referred then to vascular surgery who is closely monitoring it and plans to repeat Dopplers in 6 months.  That is due quite soon in fact and he has not heard about scheduling it.  I have encouraged him to reach out to vascular surgery  to have this scheduled.  Other than that he denies any chest pain or shortness of breath.  His blood pressure was elevated today however he forgot to take his medicine.  He reports compliance with 3 cholesterol medications.  His last lipids in July showed total 158, HDL 40, triglycerides 239 and LDL 78.  07/28/2023  Matthew Moreno is seen today in follow-up.  He had recent carotid Dopplers in February which showed moderate right and possibly severe left carotid artery stenosis.  He is followed by Dr. Lenell Antu who is ordered a CT scan to further evaluate this and he has a follow-up at the end of this month.  From a cardiac standpoint he is doing well no chest pain or shortness of breath.  His cholesterol has been reasonably well-managed with total 146, HDL 42, triglycerides 142 and LDL 79 combination therapy with Zyptimag, fenofibrate and ezetimibe.  Blood pressure was elevated today however he is quite anxious and I was running somewhat behind.  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 Langston Reusing)   COLONOSCOPY  2010   Dr. Christella Hartigan   ESOPHAGOGASTRODUODENOSCOPY N/A 09/04/2018   Procedure: ESOPHAGOGASTRODUODENOSCOPY (EGD);  Surgeon: Jeani Hawking, MD;  Location: [facility] ENDOSCOPY;  Service: Endoscopy;  Laterality: N/A;   EYE SURGERY Bilateral    Cataract   FOREIGN BODY REMOVAL  09/04/2018   Procedure: FOREIGN BODY REMOVAL;  Surgeon: Jeani Hawking, MD;  Location: [facility] ENDOSCOPY;  Service: Endoscopy;;   SKIN BIOPSY Right 12/15/2021   basal cell carcinoma of the scalp    FAMHx:  Family History  Problem Relation Age of Onset   Heart Problems Mother    Heart Problems Father     SOCHx:   reports that he quit smoking about 39 years ago. His smoking use included cigarettes. He started smoking about 59 years ago. He has a 15 pack-year smoking history. He has never used smokeless tobacco. He reports that he does not currently use alcohol after a past usage of about 3.0 standard drinks of alcohol per week. He reports that he does not use drugs.  ALLERGIES:  Allergies  Allergen Reactions   Bystolic  [Nebivolol Hcl]    Lipitor [Atorvastatin] Other (See Comments)    Muscle aches with statins   Lisinopril    Pravastatin    Red Yeast Rice [Cholestin]    Sulfonamide Derivatives     REACTION: hives   Zocor [Simvastatin]     ROS: Pertinent  items noted in HPI and remainder of comprehensive ROS otherwise negative.  HOME MEDS: Current Outpatient Medications  Medication Sig Dispense Refill   ALPRAZolam (XANAX) 0.5 MG tablet Take 1 tablet (0.5 mg total) by mouth at bedtime as needed. 30 tablet 1   amLODipine (NORVASC) 5 MG tablet Take 1 tablet (5 mg total) by mouth daily. 90 tablet 3   aspirin 81 MG tablet Take 81 mg by mouth daily.     ezetimibe (ZETIA) 10 MG tablet Take 1 tablet (10 mg total) by mouth daily. 90 tablet 3   fenofibrate 160 MG tablet TAKE 1 TABLET(160 MG) BY MOUTH DAILY 90 tablet 3   metoprolol tartrate (LOPRESSOR) 25 MG tablet 1/2 tablet twice a day 90 tablet 3   omeprazole (PRILOSEC) 20 MG capsule Take 20 mg by mouth daily.     Pitavastatin Magnesium (ZYPITAMAG) 4 MG TABS TAKE 1 TABLET BY MOUTH EVERY DAY 90 tablet 0   sodium fluoride (FLUORISHIELD) 1.1 % GEL dental gel Instill 1 drop into each tooth space of fluoride tray. Place over teeth for 5 minutes. Remove. Spit out excess. Repeat nightly. 120 mL prn   VENTOLIN HFA 108 (90 Base) MCG/ACT inhaler INHALE 2 PUFFS INTO THE LUNGS EVERY 6 HOURS AS NEEDED FOR WHEEZING OR SHORTNESS OF BREATH 18 g 0   No current facility-administered medications for this visit.   Facility-Administered Medications Ordered in Other Visits  Medication Dose Route Frequency Provider Last Rate Last Admin   influenza  inactive virus vaccine (FLUZONE/FLUARIX) injection 0.5 mL  0.5 mL Intramuscular Once Matthew Nian, MD        LABS/IMAGING: No results found for this or any previous visit (from the past 48 hours). No results found.  VITALS: BP (!) 148/76   Pulse 91   Ht 5\' 7"  (1.702 m)   Wt 160 lb 3.2 oz (72.7 kg)   SpO2 98%   BMI 25.09  kg/m   EXAM: General appearance: alert and no distress Neck: no carotid bruit, no JVD and thyroid not enlarged, symmetric, no tenderness/mass/nodules Lungs: clear to auscultation bilaterally Heart: regular rate and rhythm, S1, S2 normal, no murmur, click, rub or gallop Abdomen: soft, non-tender; bowel sounds normal; no masses,  no organomegaly Extremities: extremities normal, atraumatic, no cyanosis or edema Pulses: 2+ and symmetric Skin: Skin color, texture, turgor normal. No rashes or lesions Neurologic: Grossly normal Psych: Pleasant  EKG: EKG Interpretation Date/Time:  Wednesday July 28 2023 14:49:02 EDT Ventricular Rate:  77 PR Interval:  150 QRS Duration:  84 QT Interval:  360 QTC Calculation: 407 R Axis:   33  Text Interpretation: Normal sinus rhythm Normal ECG When compared with ECG of 01-May-2018 21:55, PREVIOUS ECG IS PRESENT Confirmed by Zoila Shutter [phone]) on 07/28/2023 3:26:59 PM    ASSESSMENT: Moderate nonobstructive coronary artery disease (2010 cath) Hypertension Dyslipidemia - on Livalo, ezetimibe and fenofibrate Recent tonsillar cancer DOE - LVEF 60-65%, mild AI (no discernible murmur) Moderate bilateral carotid artery stenosis-followed by vascular surgery  PLAN: 1.   Mr. Stith now has moderate to possibly severe carotid stenosis in the left and has an upcoming CT scan to further evaluate this and follow-up with Dr. Lenell Antu.  His lipids are reasonably well-controlled.  Blood pressure is elevated today on review of his anxious and we were behind.  He denies any chest pain or shortness of breath.  I think he would be at acceptable risk to proceed with endarterectomy or stenting if necessary.  He could hold the  aspirin if necessary for a week prior to the procedure.  Plan otherwise follow-up with [place] in a year or sooner as necessary.  [name], MD, [facility], FACP  [place]  [facility]  Medical Director of the Advanced Lipid Disorders &   Cardiovascular Risk Reduction Clinic Diplomate of the American Board of Clinical Lipidology Attending Cardiologist  Direct Dial: [phone]  Fax: [phone]  Website:  [email or web address] 07/28/2023, 3:27 PM

## 2023-07-28 NOTE — Patient Instructions (Signed)
 Medication Instructions:  NO CHANGES  *If you need a refill on your cardiac medications before your next appointment, please call your pharmacy*   Follow-Up: At [facility], you and your health needs are our priority.  As part of our continuing mission to provide you with exceptional heart care, we have created designated Provider Care Teams.  These Care Teams include your primary Cardiologist (physician) and Advanced Practice Providers (APPs -  Physician Assistants and Nurse Practitioners) who all work together to provide you with the care you need, when you need it.  We recommend signing up for the patient portal called "MyChart".  Sign up information is provided on this After Visit Summary.  MyChart is used to connect with patients for Virtual Visits (Telemedicine).  Patients are able to view lab/test results, encounter notes, upcoming appointments, etc.  Non-urgent messages can be sent to your provider as well.   To learn more about what you can do with MyChart, go to [email or web address].    Your next appointment:   12 months with Dr. Rennis Golden      1st Floor: - Lobby - Registration  - Pharmacy  - Lab - Cafe  2nd Floor: - PV Lab - Diagnostic Testing (echo, CT, nuclear med)  3rd Floor: - Vacant  4th Floor: - TCTS (cardiothoracic surgery) - AFib Clinic - Structural Heart Clinic - Vascular Surgery  - Vascular Ultrasound  5th Floor: - HeartCare Cardiology (general and EP) - Clinical Pharmacy for coumadin, hypertension, lipid, weight-loss medications, and med management appointments    Valet parking services will be available as well.

## 2023-07-31 ENCOUNTER — Other Ambulatory Visit: Payer: Self-pay | Admitting: Family Medicine

## 2023-07-31 DIAGNOSIS — Z8249 Family history of ischemic heart disease and other diseases of the circulatory system: Secondary | ICD-10-CM

## 2023-07-31 DIAGNOSIS — E781 Pure hyperglyceridemia: Secondary | ICD-10-CM

## 2023-08-02 ENCOUNTER — Encounter: Payer: Self-pay | Admitting: Vascular Surgery

## 2023-08-02 NOTE — Telephone Encounter (Signed)
 Is this okay to refill?

## 2023-08-06 ENCOUNTER — Ambulatory Visit
Admission: RE | Admit: 2023-08-06 | Discharge: 2023-08-06 | Disposition: A | Discharge status: Home / Self Care | Payer: Medicare Other | Source: Ambulatory Visit | Attending: Vascular Surgery | Admitting: Vascular Surgery

## 2023-08-06 DIAGNOSIS — I6502 Occlusion and stenosis of left vertebral artery: Secondary | ICD-10-CM | POA: Diagnosis not present

## 2023-08-06 DIAGNOSIS — I6523 Occlusion and stenosis of bilateral carotid arteries: Secondary | ICD-10-CM | POA: Diagnosis not present

## 2023-08-06 MED ORDER — IOPAMIDOL (ISOVUE-370) INJECTION 76%
75.0000 mL | Freq: Once | INTRAVENOUS | Status: AC | PRN
  Administered 2023-08-06: 75 mL via INTRAVENOUS

## 2023-08-09 NOTE — Progress Notes (Unsigned)
 [facility] [place]  ASSESSMENT / PLAN: 78 y.o. male with asymptomatic bilateral (>80%) carotid artery stenosis.    The patient should continue best medical therapy for carotid artery stenosis including: Complete cessation from all tobacco products. Blood glucose control with goal A1c < 7%. Blood pressure control with goal blood pressure < 140/90 mmHg. Lipid reduction therapy with goal LDL-C <100 mg/dL  Aspirin 81mg  PO QD.  Clopidogrel 75mg  by mouth daily Atorvastatin 40-80mg  PO QD (or other "high intensity" statin therapy).   CT angiogram shows highly tortuous internal carotid arteries.   Will plan to treat left side first.   I have completed Share Decision Making with Matthew Moreno prior to surgery.  Conversations included: -Discussion of all treatment options including carotid endarterectomy (CEA), CAS (which includes transcarotid artery revascularization (TCAR)), and optimal medical therapy (OMT)). -Explanation of risks and benefits for each option specific to [place] Coins clinical situation. -Integration of clinical guidelines as it relates to the patient's history and co-morbidities -Discussion and incorporation of Matthew Moreno and their personal preferences and priorities in choosing a treatment plan.  His irradiated neck and need for potentially complicated open surgery (I.e. carotid shortening + endarterectomy) expose him to a higher than average risk of cranial nerve injury. I think TCAR would be best for him and he agrees, will review scan with [facility].   CHIEF COMPLAINT: follow up carotid stenosis  HISTORY OF PRESENT ILLNESS: Matthew Moreno is a 78 y.o. male who presents to clinic for discussion of recent carotid duplex that showed carotid artery stenosis bilaterally.  The patient reports the carotid duplex was precipitated by intermittent dizziness associated with positional changes of the head.  Patient reports no focal symptoms.  He  denies amaurosis, facial droop, dysarthria, unilateral weakness or numbness.  He has never had a stroke or mini stroke before.   07/14/22: Returns to clinic for surveillance.  He is doing well overall.  No focal neurologic symptoms.  We reviewed his duplex today.  01/12/23: returns to clinic for surveillance. Denies interval CVA/TIA or symptoms typical of same. We reviewed his duplex.  07/13/23: Doing well overall.  No interval stroke or mini stroke.  I did review his significant change in his duplex today with him in detail.  08/10/23: Patient returns after CT angiogram.  This showed both significant stenosis bilaterally and looping internal carotid arteries.  Final read is still pending on the CT scan.  I counseled the patient that his severe stenosis merits asymptomatic intervention.  We reviewed the 3 modalities of intervention.  We both agreed that TCAR would be the lowest risk option for him.  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 Langston Reusing)   COLONOSCOPY  2010   Dr. Christella Hartigan    ESOPHAGOGASTRODUODENOSCOPY N/A 09/04/2018   Procedure: ESOPHAGOGASTRODUODENOSCOPY (EGD);  Surgeon: Jeani Hawking, MD;  Location[facility] ENDOSCOPY;  Service: Endoscopy;  Laterality: N/A;   EYE SURGERY Bilateral    Cataract   FOREIGN BODY REMOVAL  09/04/2018   Procedure: FOREIGN BODY REMOVAL;  Surgeon: Jeani Hawking, MD;  Location: [facility] ENDOSCOPY;  Service: Endoscopy;;   SKIN BIOPSY Right 12/15/2021   basal cell carcinoma of the scalp    Family History  Problem Relation Age of Onset   Heart Problems Mother    Heart Problems Father     Social History   Socioeconomic History   Marital status: Widowed    Spouse name: Not on file   Number of children: 2   Years of education: Not on file   Highest education level: Not on file  Occupational History   Occupation: pipe fitter    Employer: RETIRED    Comment: Retired  Tobacco Use   Smoking status: Former    Current packs/day: 0.00    Average packs/day: 0.8 packs/day for 20.0 years (15.0 ttl pk-yrs)    Types: Cigarettes    Start date: 11/02/1963    Quit date: 11/02/1983    Years since quitting: 39.7   Smokeless tobacco: Never   Tobacco comments:    Stopped Smoking ~ 20 years ago  Vaping Use   Vaping status: Never Used  Substance and Sexual Activity   Alcohol use: Not Currently    Alcohol/week: 3.0 standard drinks of alcohol    Types: 3 Cans of beer per week    Comment: 04/08/18 3 beers 2-3 night a week.    Drug use: No   Sexual activity: Not Currently  Other Topics Concern   Not on file  Social History Narrative   Patient is widowed. He had one son and one daughter. The son passed away from Tylenol toxicity.   Social Drivers of Corporate investment banker Strain: Low Risk  (10/20/2022)   Overall Financial Resource Strain (CARDIA)    Difficulty of Paying Living Expenses: Not hard at all  Food Insecurity: No Food Insecurity (10/20/2022)   Hunger Vital Sign    Worried About Running Out of Food in the Last Year: Never true    Ran Out  of Food in the Last Year: Never true  Transportation Needs: No Transportation Needs (10/20/2022)   PRAPARE - Administrator, Civil Service (Medical): No    Lack of Transportation (Non-Medical): No  Physical Activity: Sufficiently Active (10/20/2022)   Exercise Vital Sign    Days of Exercise per Week: 7 days    Minutes of Exercise per Session: 30 min  Stress: No Stress Concern Present (10/20/2022)   [employer] of Occupational Health - Occupational Stress Questionnaire    Feeling of Stress : Not at all  Social Connections: Not on file  Intimate Partner Violence: Not At Risk (04/08/2018)   Humiliation, Afraid, Rape, and Kick questionnaire    Fear of Current or Ex-Partner: No    Emotionally Abused: No    Physically Abused: No    Sexually Abused: No    Allergies  Allergen Reactions   Bystolic [Nebivolol Hcl]  Lipitor [Atorvastatin] Other (See Comments)    Muscle aches with statins   Lisinopril    Pravastatin    Red Yeast Rice [Cholestin]    Sulfonamide Derivatives     REACTION: hives   Zocor [Simvastatin]     Current Outpatient Medications  Medication Sig Dispense Refill   ALPRAZolam (XANAX) 0.5 MG tablet Take 1 tablet (0.5 mg total) by mouth at bedtime as needed. 30 tablet 1   amLODipine (NORVASC) 5 MG tablet Take 1 tablet (5 mg total) by mouth daily. 90 tablet 3   aspirin 81 MG tablet Take 81 mg by mouth daily.     ezetimibe (ZETIA) 10 MG tablet Take 1 tablet (10 mg total) by mouth daily. 90 tablet 3   fenofibrate 160 MG tablet TAKE 1 TABLET(160 MG) BY MOUTH DAILY 90 tablet 3   metoprolol tartrate (LOPRESSOR) 25 MG tablet 1/2 tablet twice a day 90 tablet 3   omeprazole (PRILOSEC) 20 MG capsule Take 20 mg by mouth daily.     Pitavastatin Magnesium (ZYPITAMAG) 4 MG TABS TAKE 1 TABLET BY MOUTH EVERY DAY 90 tablet 1   sodium fluoride (FLUORISHIELD) 1.1 % GEL dental gel Instill 1 drop into each tooth space of fluoride tray. Place over teeth for 5 minutes. Remove.  Spit out excess. Repeat nightly. 120 mL prn   VENTOLIN HFA 108 (90 Base) MCG/ACT inhaler INHALE 2 PUFFS INTO THE LUNGS EVERY 6 HOURS AS NEEDED FOR WHEEZING OR SHORTNESS OF BREATH 18 g 0   No current facility-administered medications for this visit.   Facility-Administered Medications Ordered in Other Visits  Medication Dose Route Frequency Provider Last Rate Last Admin   influenza  inactive virus vaccine (FLUZONE/FLUARIX) injection 0.5 mL  0.5 mL Intramuscular Once Ronnald Nian, MD        PHYSICAL EXAM Vitals:   08/10/23 1249 08/10/23 1251  BP: (!) 152/79 (!) 148/75  Pulse: 73   Temp: 98.1 F (36.7 C)   SpO2: 99%   Weight: 162 lb (73.5 kg)   Height: 5\' 7"  (1.702 m)      Well appearing elderly man Normal gait and station No extremity weakness  PERTINENT LABORATORY AND RADIOLOGIC DATA  Most recent CBC    Latest Ref Rng & Units 12/23/2022   10:19 AM 11/24/2021    2:43 PM 07/18/2020    1:30 PM  CBC  WBC 3.4 - 10.8 x10E3/uL 7.3  7.2  6.5   Hemoglobin 13.0 - 17.7 g/dL 16.1  09.6  04.5   Hematocrit 37.5 - 51.0 % 46.2  44.4  45.3   Platelets 150 - 450 x10E3/uL 295  312  288      Most recent CMP    Latest Ref Rng & Units 12/23/2022   10:19 AM 11/24/2021    2:43 PM 07/18/2020    1:30 PM  CMP  Glucose 70 - 99 mg/dL 99  92  78   BUN 8 - 27 mg/dL 17  13  14    Creatinine 0.76 - 1.27 mg/dL 4.09  8.11  9.14   Sodium 134 - 144 mmol/L 143  143  142   Potassium 3.5 - 5.2 mmol/L 4.7  4.8  4.9   Chloride 96 - 106 mmol/L 106  101  104   CO2 20 - 29 mmol/L 20  24  20    Calcium 8.6 - 10.2 mg/dL 78.2  95.6  21.3   Total Protein 6.0 - 8.5 g/dL 7.3  7.7  7.8   Total  Bilirubin 0.0 - 1.2 mg/dL 0.3  0.4  0.4   Alkaline Phos 44 - 121 IU/L 64  61  67   AST 0 - 40 IU/L 25  17  33   ALT 0 - 44 IU/L 13  12  23      Renal function CrCl cannot be calculated (Patient's most recent lab result is older than the maximum 21 days allowed.).  No results found for: "HGBA1C"  LDL Cholesterol (Calc)   Date Value Ref Range Status  03/30/2017 130 (H) mg/dL (calc) Final    Comment:    Reference range: <100 . Desirable range <100 mg/dL for primary prevention;   <70 mg/dL for patients with CHD or diabetic patients  with > or = 2 CHD risk factors. Marland Kitchen LDL-C is now calculated using the Martin-Hopkins  calculation, which is a validated novel method providing  better accuracy than the Friedewald equation in the  estimation of LDL-C.  Horald Pollen et al. Lenox Ahr. 7829;562(13): 2061-2068  (http://education.[facility].com/faq/FAQ164)    LDL Chol Calc (NIH)  Date Value Ref Range Status  12/23/2022 79 0 - 99 mg/dL Final   Direct LDL  Date Value Ref Range Status  05/04/2016 67 <130 mg/dL Final    Comment:        Desirable range <100 mg/dL for patients with CHD or diabetes and <70 mg/dL for diabetic patients with known heart disease.      LDL Direct  Date Value Ref Range Status  03/07/2019 100 (H) 0 - 99 mg/dL Final    Right Carotid: Velocities in the right ICA are consistent with a 60-79%                 stenosis.   Left Carotid: Velocities in the left ICA are consistent with a 80-99%  stenosis.   Vertebrals: Bilateral vertebral arteries demonstrate antegrade flow.  Subclavians: Normal flow hemodynamics were seen in bilateral subclavian               arteries.   CT angiogram of head and neck:  Severe bilateral internal carotid artery stenosis (>80%). The right side may be amenable to stenting.  The left side appears more favorable for stenting.  He has highly tortuous internal carotid arteries bilaterally.   Rande Brunt. Lenell Antu, MD FACS [facility] Phone Number: [phone] 08/10/2023 3:11 PM   Total time spent on preparing this encounter including chart review, data review, collecting history, examining the patient, coordinating care for this established patient, 30 minutes  Portions of this report may have been transcribed using  voice recognition software.  Every effort has been made to ensure accuracy; however, inadvertent computerized transcription errors may still be present.

## 2023-08-09 NOTE — H&P (View-Only) (Signed)
 [facility] [place]  ASSESSMENT / PLAN: 78 y.o. male with asymptomatic bilateral (>80%) carotid artery stenosis.    The patient should continue best medical therapy for carotid artery stenosis including: Complete cessation from all tobacco products. Blood glucose control with goal A1c < 7%. Blood pressure control with goal blood pressure < 140/90 mmHg. Lipid reduction therapy with goal LDL-C <100 mg/dL  Aspirin 81mg  PO QD.  Clopidogrel 75mg  by mouth daily Atorvastatin 40-80mg  PO QD (or other "high intensity" statin therapy).   CT angiogram shows highly tortuous internal carotid arteries.   Will plan to treat left side first.   I have completed Share Decision Making with Matthew Moreno prior to surgery.  Conversations included: -Discussion of all treatment options including carotid endarterectomy (CEA), CAS (which includes transcarotid artery revascularization (TCAR)), and optimal medical therapy (OMT)). -Explanation of risks and benefits for each option specific to Matthew Moreno clinical situation. -Integration of clinical guidelines as it relates to the patient's history and co-morbidities -Discussion and incorporation of Matthew Moreno and their personal preferences and priorities in choosing a treatment plan.  His irradiated neck and need for potentially complicated open surgery (I.e. carotid shortening + endarterectomy) expose him to a higher than average risk of cranial nerve injury. I think TCAR would be best for him and he agrees, will review scan with [facility].   CHIEF COMPLAINT: follow up carotid stenosis  HISTORY OF PRESENT ILLNESS: Matthew Moreno is a 78 y.o. male who presents to clinic for discussion of recent carotid duplex that showed carotid artery stenosis bilaterally.  The patient reports the carotid duplex was precipitated by intermittent dizziness associated with positional changes of the head.  Patient reports no focal symptoms.  He  denies amaurosis, facial droop, dysarthria, unilateral weakness or numbness.  He has never had a stroke or mini stroke before.   07/14/22: Returns to clinic for surveillance.  He is doing well overall.  No focal neurologic symptoms.  We reviewed his duplex today.  01/12/23: returns to clinic for surveillance. Denies interval CVA/TIA or symptoms typical of same. We reviewed his duplex.  07/13/23: Doing well overall.  No interval stroke or mini stroke.  I did review his significant change in his duplex today with him in detail.  08/10/23: Patient returns after CT angiogram.  This showed both significant stenosis bilaterally and looping internal carotid arteries.  Final read is still pending on the CT scan.  I counseled the patient that his severe stenosis merits asymptomatic intervention.  We reviewed the 3 modalities of intervention.  We both agreed that TCAR would be the lowest risk option for him.  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 Langston Reusing)   COLONOSCOPY  2010   Dr. Christella Hartigan    ESOPHAGOGASTRODUODENOSCOPY N/A 09/04/2018   Procedure: ESOPHAGOGASTRODUODENOSCOPY (EGD);  Surgeon: Jeani Hawking, MD;  Location: [facility] ENDOSCOPY;  Service: Endoscopy;  Laterality: N/A;   EYE SURGERY Bilateral    Cataract   FOREIGN BODY REMOVAL  09/04/2018   Procedure: FOREIGN BODY REMOVAL;  Surgeon: Jeani Hawking, MD;  Location: [facility] ENDOSCOPY;  Service: Endoscopy;;   SKIN BIOPSY Right 12/15/2021   basal cell carcinoma of the scalp    Family History  Problem Relation Age of Onset   Heart Problems Mother    Heart Problems Father     Social History   Socioeconomic History   Marital status: Widowed    Spouse name: Not on file   Number of children: 2   Years of education: Not on file   Highest education level: Not on file  Occupational History   Occupation: pipe fitter    Employer: RETIRED    Comment: Retired  Tobacco Use   Smoking status: Former    Current packs/day: 0.00    Average packs/day: 0.8 packs/day for 20.0 years (15.0 ttl pk-yrs)    Types: Cigarettes    Start date: 11/02/1963    Quit date: 11/02/1983    Years since quitting: 39.7   Smokeless tobacco: Never   Tobacco comments:    Stopped Smoking ~ 20 years ago  Vaping Use   Vaping status: Never Used  Substance and Sexual Activity   Alcohol use: Not Currently    Alcohol/week: 3.0 standard drinks of alcohol    Types: 3 Cans of beer per week    Comment: 04/08/18 3 beers 2-3 night a week.    Drug use: No   Sexual activity: Not Currently  Other Topics Concern   Not on file  Social History Narrative   Patient is widowed. He had one son and one daughter. The son passed away from Tylenol toxicity.   Social Drivers of Corporate investment banker Strain: Low Risk  (10/20/2022)   Overall Financial Resource Strain (CARDIA)    Difficulty of Paying Living Expenses: Not hard at all  Food Insecurity: No Food Insecurity (10/20/2022)   Hunger Vital Sign    Worried About Running Out of Food in the Last Year: Never true    Ran Out  of Food in the Last Year: Never true  Transportation Needs: No Transportation Needs (10/20/2022)   PRAPARE - Administrator, Civil Service (Medical): No    Lack of Transportation (Non-Medical): No  Physical Activity: Sufficiently Active (10/20/2022)   Exercise Vital Sign    Days of Exercise per Week: 7 days    Minutes of Exercise per Session: 30 min  Stress: No Stress Concern Present (10/20/2022)   [employer] of Occupational Health - Occupational Stress Questionnaire    Feeling of Stress : Not at all  Social Connections: Not on file  Intimate Partner Violence: Not At Risk (04/08/2018)   Humiliation, Afraid, Rape, and Kick questionnaire    Fear of Current or Ex-Partner: No    Emotionally Abused: No    Physically Abused: No    Sexually Abused: No    Allergies  Allergen Reactions   Bystolic [Nebivolol Hcl]  Lipitor [Atorvastatin] Other (See Comments)    Muscle aches with statins   Lisinopril    Pravastatin    Red Yeast Rice [Cholestin]    Sulfonamide Derivatives     REACTION: hives   Zocor [Simvastatin]     Current Outpatient Medications  Medication Sig Dispense Refill   ALPRAZolam (XANAX) 0.5 MG tablet Take 1 tablet (0.5 mg total) by mouth at bedtime as needed. 30 tablet 1   amLODipine (NORVASC) 5 MG tablet Take 1 tablet (5 mg total) by mouth daily. 90 tablet 3   aspirin 81 MG tablet Take 81 mg by mouth daily.     ezetimibe (ZETIA) 10 MG tablet Take 1 tablet (10 mg total) by mouth daily. 90 tablet 3   fenofibrate 160 MG tablet TAKE 1 TABLET(160 MG) BY MOUTH DAILY 90 tablet 3   metoprolol tartrate (LOPRESSOR) 25 MG tablet 1/2 tablet twice a day 90 tablet 3   omeprazole (PRILOSEC) 20 MG capsule Take 20 mg by mouth daily.     Pitavastatin Magnesium (ZYPITAMAG) 4 MG TABS TAKE 1 TABLET BY MOUTH EVERY DAY 90 tablet 1   sodium fluoride (FLUORISHIELD) 1.1 % GEL dental gel Instill 1 drop into each tooth space of fluoride tray. Place over teeth for 5 minutes. Remove.  Spit out excess. Repeat nightly. 120 mL prn   VENTOLIN HFA 108 (90 Base) MCG/ACT inhaler INHALE 2 PUFFS INTO THE LUNGS EVERY 6 HOURS AS NEEDED FOR WHEEZING OR SHORTNESS OF BREATH 18 g 0   No current facility-administered medications for this visit.   Facility-Administered Medications Ordered in Other Visits  Medication Dose Route Frequency Provider Last Rate Last Admin   influenza  inactive virus vaccine (FLUZONE/FLUARIX) injection 0.5 mL  0.5 mL Intramuscular Once Ronnald Nian, MD        PHYSICAL EXAM Vitals:   08/10/23 1249 08/10/23 1251  BP: (!) 152/79 (!) 148/75  Pulse: 73   Temp: 98.1 F (36.7 C)   SpO2: 99%   Weight: 162 lb (73.5 kg)   Height: 5\' 7"  (1.702 m)      Well appearing elderly man Normal gait and station No extremity weakness  PERTINENT LABORATORY AND RADIOLOGIC DATA  Most recent CBC    Latest Ref Rng & Units 12/23/2022   10:19 AM 11/24/2021    2:43 PM 07/18/2020    1:30 PM  CBC  WBC 3.4 - 10.8 x10E3/uL 7.3  7.2  6.5   Hemoglobin 13.0 - 17.7 g/dL 95.6  21.3  08.6   Hematocrit 37.5 - 51.0 % 46.2  44.4  45.3   Platelets 150 - 450 x10E3/uL 295  312  288      Most recent CMP    Latest Ref Rng & Units 12/23/2022   10:19 AM 11/24/2021    2:43 PM 07/18/2020    1:30 PM  CMP  Glucose 70 - 99 mg/dL 99  92  78   BUN 8 - 27 mg/dL 17  13  14    Creatinine 0.76 - 1.27 mg/dL 5.78  4.69  6.29   Sodium 134 - 144 mmol/L 143  143  142   Potassium 3.5 - 5.2 mmol/L 4.7  4.8  4.9   Chloride 96 - 106 mmol/L 106  101  104   CO2 20 - 29 mmol/L 20  24  20    Calcium 8.6 - 10.2 mg/dL 52.8  41.3  24.4   Total Protein 6.0 - 8.5 g/dL 7.3  7.7  7.8   Total  Bilirubin 0.0 - 1.2 mg/dL 0.3  0.4  0.4   Alkaline Phos 44 - 121 IU/L 64  61  67   AST 0 - 40 IU/L 25  17  33   ALT 0 - 44 IU/L 13  12  23      Renal function CrCl cannot be calculated (Patient's most recent lab result is older than the maximum 21 days allowed.).  No results found for: "HGBA1C"  LDL Cholesterol (Calc)   Date Value Ref Range Status  03/30/2017 130 (H) mg/dL (calc) Final    Comment:    Reference range: <100 . Desirable range <100 mg/dL for primary prevention;   <70 mg/dL for patients with CHD or diabetic patients  with > or = 2 CHD risk factors. Marland Kitchen LDL-C is now calculated using the Martin-Hopkins  calculation, which is a validated novel method providing  better accuracy than the Friedewald equation in the  estimation of LDL-C.  Horald Pollen et al. Lenox Ahr. 1610;960(45): 2061-2068  (http://education.[facility].com/faq/FAQ164)    LDL Chol Calc (NIH)  Date Value Ref Range Status  12/23/2022 79 0 - 99 mg/dL Final   Direct LDL  Date Value Ref Range Status  05/04/2016 67 <130 mg/dL Final    Comment:        Desirable range <100 mg/dL for patients with CHD or diabetes and <70 mg/dL for diabetic patients with known heart disease.      LDL Direct  Date Value Ref Range Status  03/07/2019 100 (H) 0 - 99 mg/dL Final    Right Carotid: Velocities in the right ICA are consistent with a 60-79%                 stenosis.   Left Carotid: Velocities in the left ICA are consistent with a 80-99%  stenosis.   Vertebrals: Bilateral vertebral arteries demonstrate antegrade flow.  Subclavians: Normal flow hemodynamics were seen in bilateral subclavian               arteries.   CT angiogram of head and neck:  Severe bilateral internal carotid artery stenosis (>80%). The right side may be amenable to stenting.  The left side appears more favorable for stenting.  He has highly tortuous internal carotid arteries bilaterally.   Rande Brunt. Lenell Antu, MD FACS [facility] Phone Number: [phone] 08/10/2023 3:11 PM   Total time spent on preparing this encounter including chart review, data review, collecting history, examining the patient, coordinating care for this established patient, 30 minutes  Portions of this report may have been transcribed using  voice recognition software.  Every effort has been made to ensure accuracy; however, inadvertent computerized transcription errors may still be present.

## 2023-08-10 ENCOUNTER — Other Ambulatory Visit: Payer: Self-pay

## 2023-08-10 ENCOUNTER — Encounter: Payer: Self-pay | Admitting: Vascular Surgery

## 2023-08-10 ENCOUNTER — Ambulatory Visit: Payer: Medicare Other | Admitting: Vascular Surgery

## 2023-08-10 VITALS — BP 148/75 | HR 73 | Temp 98.1°F | Ht 67.0 in | Wt 162.0 lb

## 2023-08-10 DIAGNOSIS — I6523 Occlusion and stenosis of bilateral carotid arteries: Secondary | ICD-10-CM

## 2023-08-11 MED ORDER — CLOPIDOGREL BISULFATE 75 MG PO TABS: 75.0000 mg | ORAL_TABLET | Freq: Every day | ORAL | 6 refills | Status: AC

## 2023-08-11 NOTE — Addendum Note (Signed)
 Addended by: Leonie Douglas on: 08/11/2023 10:27 AM   Modules accepted: Orders

## 2023-08-12 NOTE — Pre-Procedure Instructions (Addendum)
 Surgical Instructions   Your procedure is scheduled on August 23, 2023. Report to [facility] Main Entrance "A" at 5:30 A.M., then check in with the Admitting office. Any questions or running late day of surgery: call [phone]  Questions prior to your surgery date: call [phone], Monday-Friday, 8am-4pm. If you experience any cold or flu symptoms such as cough, fever, chills, shortness of breath, etc. between now and your scheduled surgery, please notify us at the above number.     Remember:  Do not eat or drink after midnight the night before your surgery    Take these medicines the morning of surgery with A SIP OF WATER: amLODipine (NORVASC)  aspirin  clopidogrel (PLAVIX)  ezetimibe (ZETIA)  fenofibrate  metoprolol tartrate (LOPRESSOR)  omeprazole (PRILOSEC)  Pitavastatin Magnesium (ZYPITAMAG)    May take these medicines IF NEEDED: ALPRAZolam (XANAX)  fexofenadine (ALLEGRA)  VENTOLIN HFA 108 (90 Base) inhaler - please bring inhaler with you morning of surgery   One week prior to surgery, STOP taking any Aleve, Naproxen, Ibuprofen, Motrin, Advil, Goody's, BC's, all herbal medications, fish oil, and non-prescription vitamins.                     Do NOT Smoke (Tobacco/Vaping) for 24 hours prior to your procedure.  If you use a CPAP at night, you may bring your mask/headgear for your overnight stay.   You will be asked to remove any contacts, glasses, piercing's, hearing aid's, dentures/partials prior to surgery. Please bring cases for these items if needed.    Patients discharged the day of surgery will not be allowed to drive home, and someone needs to stay with them for 24 hours.  SURGICAL WAITING ROOM VISITATION Patients may have no more than 2 support people in the waiting area - these visitors may rotate.   Pre-op nurse will coordinate an appropriate time for 1 ADULT support person, who may not rotate, to accompany patient in pre-op.  Children under the age of 31  must have an adult with them who is not the patient and must remain in the main waiting area with an adult.  If the patient needs to stay at the hospital during part of their recovery, the visitor guidelines for inpatient rooms apply.  Please refer to the [facility] website for the visitor guidelines for any additional information.   If you received a COVID test during your pre-op visit  it is requested that you wear a mask when out in public, stay away from anyone that may not be feeling well and notify your surgeon if you develop symptoms. If you have been in contact with anyone that has tested positive in the last 10 days please notify you surgeon.      Pre-operative CHG Bathing Instructions   You can play a key role in reducing the risk of infection after surgery. Your skin needs to be as free of germs as possible. You can reduce the number of germs on your skin by washing with CHG (chlorhexidine gluconate) soap before surgery. CHG is an antiseptic soap that kills germs and continues to kill germs even after washing.   DO NOT use if you have an allergy to chlorhexidine/CHG or antibacterial soaps. If your skin becomes reddened or irritated, stop using the CHG and notify one of our RNs at [phone].              TAKE A SHOWER THE NIGHT BEFORE SURGERY AND THE DAY OF SURGERY  Please keep in mind the following:  DO NOT shave, including legs and underarms, 48 hours prior to surgery.   You may shave your face before/day of surgery.  Place clean sheets on your bed the night before surgery Use a clean washcloth (not used since being washed) for each shower. DO NOT sleep with pet's night before surgery.  CHG Shower Instructions:  Wash your face and private area with normal soap. If you choose to wash your hair, wash first with your normal shampoo.  After you use shampoo/soap, rinse your hair and body thoroughly to remove shampoo/soap residue.  Turn the water OFF and apply half the  bottle of CHG soap to a CLEAN washcloth.  Apply CHG soap ONLY FROM YOUR NECK DOWN TO YOUR TOES (washing for 3-5 minutes)  DO NOT use CHG soap on face, private areas, open wounds, or sores.  Pay special attention to the area where your surgery is being performed.  If you are having back surgery, having someone wash your back for you may be helpful. Wait 2 minutes after CHG soap is applied, then you may rinse off the CHG soap.  Pat dry with a clean towel  Put on clean pajamas    Additional instructions for the day of surgery: DO NOT APPLY any lotions, deodorants, cologne, or perfumes.   Do not wear jewelry or makeup Do not wear nail polish, gel polish, artificial nails, or any other type of covering on natural nails (fingers and toes) Do not bring valuables to the hospital. [facility] is not responsible for valuables/personal belongings. Put on clean/comfortable clothes.  Please brush your teeth.  Ask your nurse before applying any prescription medications to the skin.

## 2023-08-13 ENCOUNTER — Encounter (HOSPITAL_COMMUNITY): Payer: Self-pay

## 2023-08-13 ENCOUNTER — Encounter (HOSPITAL_COMMUNITY)
Admission: RE | Admit: 2023-08-13 | Discharge: 2023-08-13 | Disposition: A | Discharge status: Home / Self Care | Source: Ambulatory Visit | Attending: Vascular Surgery | Admitting: Vascular Surgery

## 2023-08-13 ENCOUNTER — Other Ambulatory Visit: Payer: Self-pay

## 2023-08-13 VITALS — BP 160/65 | HR 77 | Temp 98.0°F | Resp 18 | Ht 67.0 in | Wt 159.4 lb

## 2023-08-13 DIAGNOSIS — K219 Gastro-esophageal reflux disease without esophagitis: Secondary | ICD-10-CM | POA: Diagnosis not present

## 2023-08-13 DIAGNOSIS — I10 Essential (primary) hypertension: Secondary | ICD-10-CM | POA: Insufficient documentation

## 2023-08-13 DIAGNOSIS — Z01818 Encounter for other preprocedural examination: Secondary | ICD-10-CM

## 2023-08-13 DIAGNOSIS — Z87891 Personal history of nicotine dependence: Secondary | ICD-10-CM | POA: Diagnosis not present

## 2023-08-13 DIAGNOSIS — I6523 Occlusion and stenosis of bilateral carotid arteries: Secondary | ICD-10-CM

## 2023-08-13 DIAGNOSIS — Z923 Personal history of irradiation: Secondary | ICD-10-CM | POA: Insufficient documentation

## 2023-08-13 DIAGNOSIS — I6522 Occlusion and stenosis of left carotid artery: Secondary | ICD-10-CM | POA: Diagnosis not present

## 2023-08-13 DIAGNOSIS — J449 Chronic obstructive pulmonary disease, unspecified: Secondary | ICD-10-CM | POA: Diagnosis not present

## 2023-08-13 DIAGNOSIS — Z85818 Personal history of malignant neoplasm of other sites of lip, oral cavity, and pharynx: Secondary | ICD-10-CM | POA: Insufficient documentation

## 2023-08-13 DIAGNOSIS — Z01812 Encounter for preprocedural laboratory examination: Secondary | ICD-10-CM | POA: Diagnosis not present

## 2023-08-13 DIAGNOSIS — E785 Hyperlipidemia, unspecified: Secondary | ICD-10-CM | POA: Diagnosis not present

## 2023-08-13 DIAGNOSIS — Z85828 Personal history of other malignant neoplasm of skin: Secondary | ICD-10-CM | POA: Insufficient documentation

## 2023-08-13 DIAGNOSIS — Z8249 Family history of ischemic heart disease and other diseases of the circulatory system: Secondary | ICD-10-CM | POA: Diagnosis not present

## 2023-08-13 DIAGNOSIS — I2510 Atherosclerotic heart disease of native coronary artery without angina pectoris: Secondary | ICD-10-CM | POA: Insufficient documentation

## 2023-08-13 HISTORY — DX: Peripheral vascular disease, unspecified: I73.9

## 2023-08-13 HISTORY — DX: Chronic obstructive pulmonary disease, unspecified: J44.9

## 2023-08-13 HISTORY — DX: Unspecified osteoarthritis, unspecified site: M19.90

## 2023-08-13 HISTORY — DX: Personal history of urinary calculi: Z87.442

## 2023-08-13 HISTORY — DX: Gastro-esophageal reflux disease without esophagitis: K21.9

## 2023-08-13 LAB — COMPREHENSIVE METABOLIC PANEL WITH GFR
ALT: 20 U/L (ref 0–44)
AST: 31 U/L (ref 15–41)
Albumin: 3.8 g/dL (ref 3.5–5.0)
Alkaline Phosphatase: 48 U/L (ref 38–126)
Anion gap: 10 (ref 5–15)
BUN: 13 mg/dL (ref 8–23)
CO2: 26 mmol/L (ref 22–32)
Calcium: 9.7 mg/dL (ref 8.9–10.3)
Chloride: 108 mmol/L (ref 98–111)
Creatinine, Ser: 1.28 mg/dL — ABNORMAL HIGH (ref 0.61–1.24)
GFR, Estimated: 58 mL/min — ABNORMAL LOW (ref >60)
Glucose, Bld: 94 mg/dL (ref 70–99)
Potassium: 4.4 mmol/L (ref 3.5–5.1)
Sodium: 144 mmol/L (ref 135–145)
Total Bilirubin: 0.6 mg/dL (ref 0.0–1.2)
Total Protein: 7.8 g/dL (ref 6.5–8.1)

## 2023-08-13 LAB — APTT: aPTT: 33 s (ref 24–36)

## 2023-08-13 LAB — SURGICAL PCR SCREEN
MRSA, PCR: POSITIVE — AB
Staphylococcus aureus: POSITIVE — AB

## 2023-08-13 LAB — URINALYSIS, ROUTINE W REFLEX MICROSCOPIC
Bilirubin Urine: NEGATIVE
Glucose, UA: NEGATIVE mg/dL
Hgb urine dipstick: NEGATIVE
Ketones, ur: NEGATIVE mg/dL
Leukocytes,Ua: NEGATIVE
Nitrite: NEGATIVE
Protein, ur: NEGATIVE mg/dL
Specific Gravity, Urine: 1.02 (ref 1.005–1.030)
pH: 5 (ref 5.0–8.0)

## 2023-08-13 LAB — CBC
HCT: 44.3 % (ref 39.0–52.0)
Hemoglobin: 14.6 g/dL (ref 13.0–17.0)
MCH: 31.1 pg (ref 26.0–34.0)
MCHC: 33 g/dL (ref 30.0–36.0)
MCV: 94.5 fL (ref 80.0–100.0)
Platelets: 336 10*3/uL (ref 150–400)
RBC: 4.69 MIL/uL (ref 4.22–5.81)
RDW: 13 % (ref 11.5–15.5)
WBC: 6.4 10*3/uL (ref 4.0–10.5)
nRBC: 0 % (ref 0.0–0.2)

## 2023-08-13 LAB — TYPE AND SCREEN
ABO/RH(D): O NEG
Antibody Screen: NEGATIVE

## 2023-08-13 LAB — PROTIME-INR
INR: 1.1 (ref 0.8–1.2)
Prothrombin Time: 13.9 s (ref 11.4–15.2)

## 2023-08-13 NOTE — Progress Notes (Signed)
 PCP - Dr. Sharlot Gowda Cardiologist - Dr. Zoila Shutter - Last office visit 07/28/2023  PPM/ICD - Denies Device Orders - n/a Rep Notified - n/a  Chest x-ray - n/a EKG - 07/28/2023 Stress Test - 01/16/2016 ECHO - 01/23/2016 Cardiac Cath - 06/19/2008  Sleep Study - Denies CPAP - n/a  No DM  Last dose of GLP1 agonist- n/a GLP1 instructions: n/a  Blood Thinner Instructions: Pt instructed to continue taking Plavix through the morning of surgery Aspirin Instructions: Pt instructed to continue taking ASA through the morning of surgery  NPO after midnight  COVID TEST- n/a   Anesthesia review: Yes. Cardiac clearance. Difficult intubation flag noted in patients chart. Pt came to ED in April 2020 with piece of meat stuck in his throat. He was intubated for endoscopy where piece of meat was removed. He was discharged later that day.   Patient denies shortness of breath, fever, cough and chest pain at PAT appointment. Pt endorses "sinus infection" that started 3/23 after he didn't wear his mask mowing the lawn. His symptoms included runny nose and productive cough with yellow phlegm. Pt took allegra one day and has used his inhaler at least once a day since then with improvement of symptoms. Discussed with Antionette Poles, PA-C, pt is to notify hospital and surgeon's office if his symptoms were to worsen (fever, sore throat, productive cough, runny nose). Pt understood instructions.   All instructions explained to the patient, with a verbal understanding of the material. Patient agrees to go over the instructions while at home for a better understanding. Patient also instructed to self quarantine after being tested for COVID-19. The opportunity to ask questions was provided.

## 2023-08-16 NOTE — Anesthesia Preprocedure Evaluation (Addendum)
 Anesthesia Evaluation  Patient identified by MRN, date of birth, ID band Patient awake    Reviewed: Allergy & Precautions, NPO status , Patient's Chart, lab work & pertinent test results  Airway Mallampati: II  TM Distance: >3 FB Neck ROM: Full    Dental no notable dental hx. (+) Poor Dentition, Missing   Pulmonary COPD, former smoker   Pulmonary exam normal        Cardiovascular hypertension, Pt. on medications and Pt. on home beta blockers + CAD and + Peripheral Vascular Disease   Rhythm:Regular Rate:Normal     Neuro/Psych   Anxiety     Carotid stenosis     GI/Hepatic Neg liver ROS,GERD  Medicated,,  Endo/Other  negative endocrine ROS    Renal/GU   negative genitourinary   Musculoskeletal  (+) Arthritis , Osteoarthritis,    Abdominal Normal abdominal exam  (+)   Peds  Hematology Lab Results      Component                Value               Date                      WBC                      6.4                 08/13/2023                HGB                      14.6                08/13/2023                HCT                      44.3                08/13/2023                MCV                      94.5                08/13/2023                PLT                      336                 08/13/2023             Lab Results      Component                Value               Date                      NA                       144                 08/13/2023  K                        4.4                 08/13/2023                CO2                      26                  08/13/2023                GLUCOSE                  94                  08/13/2023                BUN                      13                  08/13/2023                CREATININE               1.28 (H)            08/13/2023                CALCIUM                  9.7                 08/13/2023                EGFR                      56 (L)              12/23/2022                GFRNONAA                 58 (L)              08/13/2023              Anesthesia Other Findings He has a "DIFFICULT AIRWAY" flag in his chart. On 09/04/18 he presented to the ED with meat impaction in his esophagus. He required EGD per GI to extract. ED provider intubated patient prior to the procedure as there was concern about difficult intubation given prior neck radiation with acute food impaction. Per documentation: "Difficult airway due to:: Difficulty was anticipated  Placed By: ED Physician  Airway Device: Endotracheal Tube  Laryngoscope Blade: MAC;4  ETT Types: Oral  Size (mm): 7.5 mm  Cuffed: Cuffed  Insertion attempts: 1  Airway Equipment: Stylet  Placement Confirmation  Reproductive/Obstetrics                             Anesthesia Physical Anesthesia Plan  ASA: 3  Anesthesia Plan: General   Post-op Pain Management:    Induction: Intravenous  PONV Risk Score and Plan: 2 and Ondansetron, Dexamethasone and Treatment may vary due to age or medical condition  Airway Management Planned: Mask and Oral ETT  Additional Equipment: None  Intra-op Plan:   Post-operative Plan: Extubation in OR  Informed Consent: I have reviewed the patients History and Physical, chart, labs and discussed the procedure including the risks, benefits and alternatives for the proposed anesthesia with the patient or authorized representative who has indicated his/her understanding and acceptance.       Plan Discussed with: CRNA  Anesthesia Plan Comments: (PAT note written 08/16/2023 by Shonna Chock, PA-C.  He has a "DIFFICULT AIRWAY" flag in his chart. On 09/04/18 he presented to the ED with meat impaction in his esophagus. He required EGD per GI to extract. ED provider intubated patient prior to the procedure as there was concern about difficult intubation given prior neck radiation with acute food impaction. Per documentation:  "Difficult airway due to:: Difficulty was anticipated Placed By: ED Physician Airway Device: Endotracheal Tube Laryngoscope Blade: MAC;4 ETT Types: Oral Size (mm): 7.5 mm Cuffed: Cuffed Insertion attempts: 1 Airway Equipment: Stylet Placement Confirmation.."  )       Anesthesia Quick Evaluation

## 2023-08-16 NOTE — Progress Notes (Signed)
 Anesthesia Chart Review:  Case: [id number] Date/Time: 08/23/23 0715   Procedure: TRANSCAROTID ARTERY REVASCULARIZATION (TCAR) (Left)   Anesthesia type: General   Diagnosis: Stenosis of left carotid artery [I65.22]   Pre-op diagnosis: left carotid stenosis   [facility] ROOM 16 / [facility]   Surgeons: Leonie Douglas, MD       DISCUSSION: Patient is a 78 year old male scheduled for the above procedure.   History includes former smoker (quit 11/02/83), COPD, HTN, HLD (statin intolerance), carotid artery stenosis, CAD, left tonsillar SCC cancer (s/p radiation 2015), dysphagia, GERD, skin cancer (BCC), panic attacks.   He has a "DIFFICULT AIRWAY" flag in his chart. On 09/04/18 he presented to the ED with meat impaction in his esophagus. He required EGD per GI to extract. ED provider intubated patient prior to the procedure as there was concern about difficult intubation given prior neck radiation with acute food impaction. Per documentation: "Difficult airway due to:: Difficulty was anticipated  Placed By: ED Physician  Airway Device: Endotracheal Tube  Laryngoscope Blade: MAC;4  ETT Types: Oral  Size (mm): 7.5 mm  Cuffed: Cuffed  Insertion attempts: 1  Airway Equipment: Stylet  Placement Confirmation.."   Last cardiology visit with Dr. Rennis Golden was on 07/28/23 for follow-up moderate CAD (2010 LHC), HTN, dyslipidemia, DOE, carotid artery stenosis. He noted patient had pending CTA and may be considered for left carotid intervention. He thought patient would be at "acceptable risk to proceed with endarterectomy or stenting if necessary." One year follow-up recommended.   He is to continue ASA and Plavix for procedure. Anesthesia team to evaluate on the day of surgery.    VS: BP (!) 160/65   Pulse 77   Temp 36.7 C   Resp 18   Ht 5\' 7"  (1.702 m)   Wt 72.3 kg   SpO2 99%   BMI 24.97 kg/m    PROVIDERS: Ronnald Nian, MD is PCP Zoila Shutter, MD is cardiologist Lonie Peak, MD is  [facility]   LABS: Labs reviewed: Acceptable for surgery. (all labs ordered are listed, but only abnormal results are displayed)  Labs Reviewed  SURGICAL PCR SCREEN - Abnormal; Notable for the following components:      Result Value   MRSA, PCR POSITIVE (*)    Staphylococcus aureus POSITIVE (*)    All other components within normal limits  COMPREHENSIVE METABOLIC PANEL WITH GFR - Abnormal; Notable for the following components:   Creatinine, Ser 1.28 (*)    GFR, Estimated 58 (*)    All other components within normal limits  CBC  PROTIME-INR  APTT  URINALYSIS, ROUTINE W REFLEX MICROSCOPIC  TYPE AND SCREEN     IMAGES: CTA Neck 08/06/23: Report in process.   EKG: 07/28/23: NSR   CV: US Carotid 07/13/23: Summary:  - Right Carotid: Velocities in the right ICA are consistent with a 60-79%                 stenosis.  - Left Carotid: Velocities in the left ICA are consistent with a 80-99%  stenosis.  - Vertebrals: Bilateral vertebral arteries demonstrate antegrade flow.  - Subclavians: Normal flow hemodynamics were seen in bilateral subclavian               arteries.    Echo 01/23/16: Study Conclusions  - Left ventricle: The cavity size was normal. Wall thickness was    normal. Systolic function was normal. The estimated ejection    fraction was in the range of 60%  to 65%. Wall motion was normal;    there were no regional wall motion abnormalities. Doppler    parameters are consistent with abnormal left ventricular    relaxation (grade 1 diastolic dysfunction).  - Aortic valve: There was no stenosis. There was mild    regurgitation.  - Mitral valve: There was no significant regurgitation.  - Right ventricle: The cavity size was normal. Systolic function    was normal.  - Tricuspid valve: Peak RV-RA gradient (S): 24 mm Hg.  - Pulmonary arteries: PA peak pressure: 27 mm Hg (S).  - Inferior vena cava: The vessel was normal in size. The    respirophasic diameter changes were in  the normal range (>= 50%),    consistent with normal central venous pressure.  Impressions:  - Normal LV size with EF 60-65%. Normal RV size and systolic    function. Mild aortic insufficiency.    Nuclear stress test 01/16/16: The left ventricular ejection fraction is mildly decreased (45-54%). Nuclear stress EF: 48%. Blood pressure demonstrated a hypertensive response to exercise. There was no ST segment deviation noted during stress. The study is normal. This is a low risk study.  Normal exercise nuclear stress test with no evidence of prior scar or ischemia. LVEF mildly decreased but visually appears better than calculated. A correlation with an echocardiogram is recommended. Baseline hypertension with hypertensive response to exercise (215/72 mmHg).   Cardiac cath 06/19/08: CORONARY ARTERIOGRAPHY:  On fluoroscopy, there was moderate-to-severe calcification in the proximal and mid LAD. 1. The left main normal and bifurcated. 2. Circumflex.  The circumflex gave rise to three OM vessels.  There was 50% ostial narrowing of the OM #1 (this will need to be managed medically.) The remainder of the circumflex system was free of disease. 3. LAD.  The LAD crossed the apex of the heart. It was densely calcified to at least the proximal half of the vessel.  There was mild eccentric narrowing in the midportion of the LAD of about 40-50% severity.  The distal vessel was free of disease.  The first and second diagonals were also free of disease. 4. Right coronary artery.  This was a tortuous vessel with less than 30% narrowing in its midportion.  The distal right coronary artery, the posterior descending artery and the posterolateral vessel were all free of disease. - Mr. Siddoway will require medical management and will probably need a stress test in about a year.   Past Medical History:  Diagnosis Date   Arthritis    bilateral hands   Basal cell carcinoma of scalp 01/14/2022   CAD (coronary artery  disease) 2008   COPD (chronic obstructive pulmonary disease) (HCC)    Dyslipidemia    Dysphagia    Family history of heart disease    GERD (gastroesophageal reflux disease)    History of kidney stones    Hypertension    Panic attacks    Peripheral vascular disease (HCC)    Carotid Stenosis   S/P radiation therapy 02/12/2014-04/02/2014   Left tonsil and bilateral neck / 70 Gy in 35 fractions to gross disease, 63 Gy in 35 fractions to high risk nodalechelons, and 56 Gy in 35 fractions to intermediate risk nodal echelons    Statin intolerance    Tonsil cancer (HCC) 01/05/2014   left    Past Surgical History:  Procedure Laterality Date   BICEPS TENDON REPAIR Left    CARDIAC CATHETERIZATION  06/19/2008   normal left main, Cfx with 3 OMs (50%  osital narrowing in OM1), LAD densely calcified in the proximal half of vessel with eccentric narrowing in mid region (40-50% severity), diag 1 & 2 free of disease, RCA with less than 30% narrowing in midportion (Dr. Langston Reusing)   COLONOSCOPY  2010   Dr. Christella Hartigan   ESOPHAGOGASTRODUODENOSCOPY N/A 09/04/2018   Procedure: ESOPHAGOGASTRODUODENOSCOPY (EGD);  Surgeon: Jeani Hawking, MD;  Location[facility] END[facility]COPY;  Service: Endoscopy;  Laterality: N/A;   EYE SURGERY Bilateral    Cataract   FOREIGN BODY REMOVAL  09/04/2018   Procedure: FOREIGN BODY REMOVAL;  Surgeon: Jeani Hawking, MD;  Location: [facility] ENDOSCOPY;  Service: Endoscopy;;   MULTIPLE TOOTH EXTRACTIONS     All top teeth and most of bottom teeth   SKIN BIOPSY Right 12/15/2021   basal cell carcinoma of the scalp    MEDICATIONS:  ALPRAZolam (XANAX) 0.5 MG tablet   amLODipine (NORVASC) 5 MG tablet   aspirin 81 MG tablet   clopidogrel (PLAVIX) 75 MG tablet   ezetimibe (ZETIA) 10 MG tablet   fenofibrate 160 MG tablet   fexofenadine (ALLEGRA) 180 MG tablet   metoprolol tartrate (LOPRESSOR) 25 MG tablet   omeprazole (PRILOSEC) 20 MG capsule   Pitavastatin Magnesium (ZYPITAMAG) 4 MG TABS   VENTOLIN  HFA 108 (90 Base) MCG/ACT inhaler   No current facility-administered medications for this encounter.    influenza  inactive virus vaccine (FLUZONE/FLUARIX) injection 0.5 mL    Shonna Chock, PA-C Surgical Short Stay/Anesthesiology [facility] Phone [phone] [facility] Phone [phone] 08/16/2023 2:59 PM

## 2023-08-23 ENCOUNTER — Inpatient Hospital Stay (HOSPITAL_COMMUNITY)

## 2023-08-23 ENCOUNTER — Inpatient Hospital Stay (HOSPITAL_COMMUNITY): Payer: Self-pay | Admitting: Vascular Surgery

## 2023-08-23 ENCOUNTER — Other Ambulatory Visit: Payer: Self-pay

## 2023-08-23 ENCOUNTER — Encounter (HOSPITAL_COMMUNITY)
Admission: RE | Disposition: A | Discharge status: Home / Self Care | Payer: Self-pay | Source: Home / Self Care | Attending: Vascular Surgery

## 2023-08-23 ENCOUNTER — Inpatient Hospital Stay (HOSPITAL_COMMUNITY)
Admission: RE | Admit: 2023-08-23 | Discharge: 2023-08-24 | DRG: 036 | Disposition: A | Discharge status: Home / Self Care | Attending: Vascular Surgery | Admitting: Vascular Surgery

## 2023-08-23 ENCOUNTER — Inpatient Hospital Stay (HOSPITAL_COMMUNITY): Payer: Self-pay | Admitting: Registered Nurse

## 2023-08-23 ENCOUNTER — Encounter (HOSPITAL_COMMUNITY): Payer: Self-pay | Admitting: Vascular Surgery

## 2023-08-23 DIAGNOSIS — J449 Chronic obstructive pulmonary disease, unspecified: Secondary | ICD-10-CM | POA: Diagnosis present

## 2023-08-23 DIAGNOSIS — Z79899 Other long term (current) drug therapy: Secondary | ICD-10-CM

## 2023-08-23 DIAGNOSIS — I739 Peripheral vascular disease, unspecified: Secondary | ICD-10-CM | POA: Diagnosis not present

## 2023-08-23 DIAGNOSIS — Z923 Personal history of irradiation: Secondary | ICD-10-CM | POA: Diagnosis not present

## 2023-08-23 DIAGNOSIS — K219 Gastro-esophageal reflux disease without esophagitis: Secondary | ICD-10-CM | POA: Diagnosis present

## 2023-08-23 DIAGNOSIS — I6522 Occlusion and stenosis of left carotid artery: Secondary | ICD-10-CM | POA: Diagnosis not present

## 2023-08-23 DIAGNOSIS — E785 Hyperlipidemia, unspecified: Secondary | ICD-10-CM | POA: Diagnosis not present

## 2023-08-23 DIAGNOSIS — I6529 Occlusion and stenosis of unspecified carotid artery: Principal | ICD-10-CM | POA: Diagnosis present

## 2023-08-23 DIAGNOSIS — Z85828 Personal history of other malignant neoplasm of skin: Secondary | ICD-10-CM

## 2023-08-23 DIAGNOSIS — Z8249 Family history of ischemic heart disease and other diseases of the circulatory system: Secondary | ICD-10-CM

## 2023-08-23 DIAGNOSIS — Z888 Allergy status to other drugs, medicaments and biological substances status: Secondary | ICD-10-CM

## 2023-08-23 DIAGNOSIS — I2510 Atherosclerotic heart disease of native coronary artery without angina pectoris: Secondary | ICD-10-CM

## 2023-08-23 DIAGNOSIS — I10 Essential (primary) hypertension: Secondary | ICD-10-CM | POA: Diagnosis present

## 2023-08-23 DIAGNOSIS — Z23 Encounter for immunization: Secondary | ICD-10-CM | POA: Diagnosis not present

## 2023-08-23 DIAGNOSIS — Z85818 Personal history of malignant neoplasm of other sites of lip, oral cavity, and pharynx: Secondary | ICD-10-CM

## 2023-08-23 DIAGNOSIS — Z87891 Personal history of nicotine dependence: Secondary | ICD-10-CM | POA: Diagnosis not present

## 2023-08-23 DIAGNOSIS — Z7982 Long term (current) use of aspirin: Secondary | ICD-10-CM

## 2023-08-23 HISTORY — PX: TRANSCAROTID ARTERY REVASCULARIZATIONÂ: SHX6778

## 2023-08-23 HISTORY — PX: ULTRASOUND GUIDANCE FOR VASCULAR ACCESS: SHX6516

## 2023-08-23 LAB — ABO/RH: ABO/RH(D): O NEG

## 2023-08-23 SURGERY — TRANSCAROTID ARTERY REVASCULARIZATION (TCAR)
Anesthesia: General | Site: Neck | Laterality: Right

## 2023-08-23 MED ORDER — SODIUM CHLORIDE 0.9 % IV SOLN: INTRAVENOUS | Status: AC

## 2023-08-23 MED ORDER — DEXAMETHASONE SODIUM PHOSPHATE 10 MG/ML IJ SOLN
INTRAMUSCULAR | Status: DC | PRN
  Administered 2023-08-23: 5 mg via INTRAVENOUS

## 2023-08-23 MED ORDER — CHLORHEXIDINE GLUCONATE 0.12 % MT SOLN
15.0000 mL | OROMUCOSAL | Status: AC
  Administered 2023-08-23: 15 mL via OROMUCOSAL
  Filled 2023-08-23 (×2): qty 15

## 2023-08-23 MED ORDER — CLOPIDOGREL BISULFATE 75 MG PO TABS
75.0000 mg | ORAL_TABLET | Freq: Every day | ORAL | Status: DC
  Administered 2023-08-24: 75 mg via ORAL
  Filled 2023-08-23: qty 1

## 2023-08-23 MED ORDER — PHENYLEPHRINE 80 MCG/ML (10ML) SYRINGE FOR IV PUSH (FOR BLOOD PRESSURE SUPPORT)
PREFILLED_SYRINGE | INTRAVENOUS | Status: DC | PRN
  Administered 2023-08-23: 120 ug via INTRAVENOUS
  Administered 2023-08-23: 40 ug via INTRAVENOUS
  Administered 2023-08-23: 80 ug via INTRAVENOUS

## 2023-08-23 MED ORDER — HEPARIN 6000 UNIT IRRIGATION SOLUTION
Status: AC
  Filled 2023-08-23: qty 500

## 2023-08-23 MED ORDER — DOCUSATE SODIUM 100 MG PO CAPS
100.0000 mg | ORAL_CAPSULE | Freq: Every day | ORAL | Status: DC
  Administered 2023-08-24: 100 mg via ORAL
  Filled 2023-08-23: qty 1

## 2023-08-23 MED ORDER — ASPIRIN 81 MG PO CHEW
81.0000 mg | CHEWABLE_TABLET | Freq: Every day | ORAL | Status: DC
  Administered 2023-08-24: 81 mg via ORAL
  Filled 2023-08-23: qty 1

## 2023-08-23 MED ORDER — PITAVASTATIN MAGNESIUM 4 MG PO TABS: 1.0000 | ORAL_TABLET | Freq: Every day | ORAL | Status: DC

## 2023-08-23 MED ORDER — PROPOFOL 10 MG/ML IV BOLUS
INTRAVENOUS | Status: DC | PRN
  Administered 2023-08-23: 150 mg via INTRAVENOUS

## 2023-08-23 MED ORDER — EZETIMIBE 10 MG PO TABS
10.0000 mg | ORAL_TABLET | Freq: Every day | ORAL | Status: DC
  Administered 2023-08-24: 10 mg via ORAL
  Filled 2023-08-23: qty 1

## 2023-08-23 MED ORDER — EPHEDRINE SULFATE-NACL 50-0.9 MG/10ML-% IV SOSY
PREFILLED_SYRINGE | INTRAVENOUS | Status: DC | PRN
  Administered 2023-08-23 (×2): 5 mg via INTRAVENOUS

## 2023-08-23 MED ORDER — FENTANYL CITRATE (PF) 250 MCG/5ML IJ SOLN
INTRAMUSCULAR | Status: DC | PRN
  Administered 2023-08-23 (×2): 50 ug via INTRAVENOUS

## 2023-08-23 MED ORDER — FENOFIBRATE 160 MG PO TABS
160.0000 mg | ORAL_TABLET | Freq: Every day | ORAL | Status: DC
  Administered 2023-08-24: 160 mg via ORAL
  Filled 2023-08-23: qty 1

## 2023-08-23 MED ORDER — OXYCODONE-ACETAMINOPHEN 5-325 MG PO TABS
1.0000 | ORAL_TABLET | ORAL | Status: DC | PRN
Start: 2023-08-23 — End: 2023-08-24

## 2023-08-23 MED ORDER — ACETAMINOPHEN 325 MG PO TABS: 325.0000 mg | ORAL_TABLET | ORAL | Status: DC | PRN

## 2023-08-23 MED ORDER — SODIUM CHLORIDE 0.9 % IV SOLN: INTRAVENOUS | Status: DC

## 2023-08-23 MED ORDER — CEFAZOLIN SODIUM-DEXTROSE 2-4 GM/100ML-% IV SOLN
2.0000 g | INTRAVENOUS | Status: AC
  Administered 2023-08-23: 2 g via INTRAVENOUS
  Filled 2023-08-23: qty 100

## 2023-08-23 MED ORDER — ONDANSETRON HCL 4 MG/2ML IJ SOLN: 4.0000 mg | Freq: Four times a day (QID) | INTRAMUSCULAR | Status: DC | PRN

## 2023-08-23 MED ORDER — ACETAMINOPHEN 10 MG/ML IV SOLN: 1000.0000 mg | Freq: Once | INTRAVENOUS | Status: DC | PRN

## 2023-08-23 MED ORDER — CHLORHEXIDINE GLUCONATE CLOTH 2 % EX PADS: 6.0000 | MEDICATED_PAD | Freq: Once | CUTANEOUS | Status: DC

## 2023-08-23 MED ORDER — HYDRALAZINE HCL 20 MG/ML IJ SOLN: 5.0000 mg | INTRAMUSCULAR | Status: DC | PRN

## 2023-08-23 MED ORDER — SUGAMMADEX SODIUM 200 MG/2ML IV SOLN
INTRAVENOUS | Status: DC | PRN
  Administered 2023-08-23: 200 mg via INTRAVENOUS

## 2023-08-23 MED ORDER — 0.9 % SODIUM CHLORIDE (POUR BTL) OPTIME
TOPICAL | Status: DC | PRN
  Administered 2023-08-23: 1000 mL

## 2023-08-23 MED ORDER — CEFAZOLIN SODIUM-DEXTROSE 2-4 GM/100ML-% IV SOLN
2.0000 g | Freq: Three times a day (TID) | INTRAVENOUS | Status: AC
  Administered 2023-08-23 – 2023-08-24 (×2): 2 g via INTRAVENOUS
  Filled 2023-08-23 (×2): qty 100

## 2023-08-23 MED ORDER — GUAIFENESIN-DM 100-10 MG/5ML PO SYRP: 15.0000 mL | ORAL_SOLUTION | ORAL | Status: DC | PRN

## 2023-08-23 MED ORDER — POTASSIUM CHLORIDE CRYS ER 20 MEQ PO TBCR: 20.0000 meq | EXTENDED_RELEASE_TABLET | Freq: Every day | ORAL | Status: DC | PRN

## 2023-08-23 MED ORDER — FENTANYL CITRATE (PF) 250 MCG/5ML IJ SOLN
INTRAMUSCULAR | Status: AC
  Filled 2023-08-23: qty 5

## 2023-08-23 MED ORDER — ALUM & MAG HYDROXIDE-SIMETH 200-200-20 MG/5ML PO SUSP: 15.0000 mL | ORAL | Status: DC | PRN

## 2023-08-23 MED ORDER — PROPOFOL 10 MG/ML IV BOLUS
INTRAVENOUS | Status: AC
  Filled 2023-08-23: qty 20

## 2023-08-23 MED ORDER — AMLODIPINE BESYLATE 5 MG PO TABS
5.0000 mg | ORAL_TABLET | Freq: Every day | ORAL | Status: DC
  Administered 2023-08-24: 5 mg via ORAL
  Filled 2023-08-23: qty 1

## 2023-08-23 MED ORDER — IODIXANOL 320 MG/ML IV SOLN
INTRAVENOUS | Status: DC | PRN
  Administered 2023-08-23: 15 mL via INTRA_ARTERIAL

## 2023-08-23 MED ORDER — METOPROLOL TARTRATE 12.5 MG HALF TABLET
12.5000 mg | ORAL_TABLET | Freq: Two times a day (BID) | ORAL | Status: DC
  Administered 2023-08-23 – 2023-08-24 (×2): 12.5 mg via ORAL
  Filled 2023-08-23 (×2): qty 1

## 2023-08-23 MED ORDER — ROCURONIUM BROMIDE 10 MG/ML (PF) SYRINGE
PREFILLED_SYRINGE | INTRAVENOUS | Status: DC | PRN
  Administered 2023-08-23: 60 mg via INTRAVENOUS

## 2023-08-23 MED ORDER — LIDOCAINE 2% (20 MG/ML) 5 ML SYRINGE
INTRAMUSCULAR | Status: DC | PRN
Start: 2023-08-23 — End: 2023-08-23
  Administered 2023-08-23: 60 mg via INTRAVENOUS

## 2023-08-23 MED ORDER — HEPARIN 6000 UNIT IRRIGATION SOLUTION
Status: DC | PRN
  Administered 2023-08-23: 1

## 2023-08-23 MED ORDER — SODIUM CHLORIDE 0.9 % IV SOLN: 500.0000 mL | Freq: Once | INTRAVENOUS | Status: DC | PRN

## 2023-08-23 MED ORDER — HYDROMORPHONE HCL 1 MG/ML IJ SOLN: 0.2500 mg | INTRAMUSCULAR | Status: DC | PRN

## 2023-08-23 MED ORDER — ONDANSETRON HCL 4 MG/2ML IJ SOLN
INTRAMUSCULAR | Status: DC | PRN
  Administered 2023-08-23: 4 mg via INTRAVENOUS

## 2023-08-23 MED ORDER — PITAVASTATIN MAGNESIUM 4 MG PO TABS
1.0000 | ORAL_TABLET | Freq: Every day | ORAL | Status: DC
Start: 2023-08-24 — End: 2023-08-24

## 2023-08-23 MED ORDER — HEPARIN SODIUM (PORCINE) 1000 UNIT/ML IJ SOLN
INTRAMUSCULAR | Status: DC | PRN
  Administered 2023-08-23: 7000 [IU] via INTRAVENOUS
  Administered 2023-08-23: 3000 [IU] via INTRAVENOUS

## 2023-08-23 MED ORDER — MAGNESIUM SULFATE 2 GM/50ML IV SOLN: 2.0000 g | Freq: Every day | INTRAVENOUS | Status: DC | PRN

## 2023-08-23 MED ORDER — PANTOPRAZOLE SODIUM 40 MG PO TBEC
40.0000 mg | DELAYED_RELEASE_TABLET | Freq: Every day | ORAL | Status: DC
  Administered 2023-08-24: 40 mg via ORAL
  Filled 2023-08-23: qty 1

## 2023-08-23 MED ORDER — LACTATED RINGERS IV SOLN: INTRAVENOUS | Status: DC | PRN

## 2023-08-23 MED ORDER — PHENYLEPHRINE HCL-NACL 20-0.9 MG/250ML-% IV SOLN
INTRAVENOUS | Status: DC | PRN
  Administered 2023-08-23: 50 ug/min via INTRAVENOUS

## 2023-08-23 MED ORDER — MORPHINE SULFATE (PF) 2 MG/ML IV SOLN: 2.0000 mg | INTRAVENOUS | Status: DC | PRN

## 2023-08-23 MED ORDER — PHENOL 1.4 % MT LIQD: 1.0000 | OROMUCOSAL | Status: DC | PRN

## 2023-08-23 MED ORDER — PROTAMINE SULFATE 10 MG/ML IV SOLN
INTRAVENOUS | Status: DC | PRN
  Administered 2023-08-23: 10 mg via INTRAVENOUS
  Administered 2023-08-23: 40 mg via INTRAVENOUS

## 2023-08-23 MED ORDER — ALPRAZOLAM 0.5 MG PO TABS
0.5000 mg | ORAL_TABLET | Freq: Every evening | ORAL | Status: DC | PRN
  Administered 2023-08-23: 0.5 mg via ORAL
  Filled 2023-08-23: qty 1

## 2023-08-23 MED ORDER — ACETAMINOPHEN 325 MG RE SUPP: 325.0000 mg | RECTAL | Status: DC | PRN

## 2023-08-23 SURGICAL SUPPLY — 41 items
BAG BANDED W/RUBBER/TAPE 36X54 (MISCELLANEOUS) ×2 IMPLANT
CANISTER SUCT 3000ML PPV (MISCELLANEOUS) ×2 IMPLANT
CATH BALLN ENROUTE 5X35 (CATHETERS) IMPLANT
CATH BEACON 5 .035 40 KMP TP (CATHETERS) IMPLANT
CHLORAPREP W/TINT 26 (MISCELLANEOUS) ×4 IMPLANT
CLIP LIGATING EXTRA MED SLVR (CLIP) IMPLANT
CLIP LIGATING EXTRA SM BLUE (MISCELLANEOUS) IMPLANT
COVER DOME SNAP 22 D (MISCELLANEOUS) ×2 IMPLANT
COVER PROBE W GEL 5X96 (DRAPES) ×2 IMPLANT
DERMABOND ADVANCED .7 DNX6 (GAUZE/BANDAGES/DRESSINGS) ×2 IMPLANT
DRAPE FEMORAL ANGIO 80X135IN (DRAPES) ×2 IMPLANT
ELECT REM PT RETURN 9FT ADLT (ELECTROSURGICAL) ×2 IMPLANT
ELECTRODE REM PT RTRN 9FT ADLT (ELECTROSURGICAL) ×2 IMPLANT
GAUZE SPONGE 4X4 12PLY STRL (GAUZE/BANDAGES/DRESSINGS) ×2 IMPLANT
GLOVE BIO SURGEON STRL SZ8 (GLOVE) ×2 IMPLANT
GOWN STRL REUS W/ TWL LRG LVL3 (GOWN DISPOSABLE) ×4 IMPLANT
GOWN STRL REUS W/ TWL XL LVL3 (GOWN DISPOSABLE) ×2 IMPLANT
GUIDEWIRE ENROUTE 0.014 (WIRE) ×2 IMPLANT
KIT BASIN OR (CUSTOM PROCEDURE TRAY) ×2 IMPLANT
KIT ENCORE 26 ADVANTAGE (KITS) ×2 IMPLANT
KIT INTRODUCER GALT 7 (INTRODUCER) ×2 IMPLANT
KIT TURNOVER KIT B (KITS) ×2 IMPLANT
NDL HYPO 25GX1X1/2 BEV (NEEDLE) IMPLANT
NEEDLE HYPO 25GX1X1/2 BEV (NEEDLE) IMPLANT
NS IRRIG 1000ML POUR BTL (IV SOLUTION) ×2 IMPLANT
PACK CAROTID (CUSTOM PROCEDURE TRAY) ×2 IMPLANT
POSITIONER HEAD DONUT 9IN (MISCELLANEOUS) ×2 IMPLANT
SET MICROPUNCTURE 5F STIFF (MISCELLANEOUS) ×2 IMPLANT
STENT TRANSCAROTID 9-7X40 (Permanent Stent) IMPLANT
SUT MNCRL AB 4-0 PS2 18 (SUTURE) ×2 IMPLANT
SUT PROLENE 5 0 C 1 24 (SUTURE) ×2 IMPLANT
SUT SILK 2 0 SH (SUTURE) ×2 IMPLANT
SUT VIC AB 3-0 SH 27X BRD (SUTURE) ×2 IMPLANT
SYR 10ML LL (SYRINGE) ×6 IMPLANT
SYR 20ML LL LF (SYRINGE) ×2 IMPLANT
SYR CONTROL 10ML LL (SYRINGE) IMPLANT
SYSTEM TRANSCAROTID NEUROPRTCT (MISCELLANEOUS) ×2 IMPLANT
TOWEL GREEN STERILE (TOWEL DISPOSABLE) ×2 IMPLANT
TRANSCAROTID NEUROPROTECT SYS (MISCELLANEOUS) ×2 IMPLANT
WATER STERILE IRR 1000ML POUR (IV SOLUTION) ×2 IMPLANT
WIRE BENTSON .035X145CM (WIRE) ×2 IMPLANT

## 2023-08-23 NOTE — Anesthesia Procedure Notes (Signed)
 Arterial Line Insertion Start/End4/11/2023 7:00 AM, 08/23/2023 7:15 AM Performed by: Loleta Coleman Kalas, CRNA, CRNA  Patient location: Pre-op. Preanesthetic checklist: patient identified, IV checked, site marked, risks and benefits discussed, surgical consent, monitors and equipment checked, pre-op evaluation, timeout performed and anesthesia consent Lidocaine 1% used for infiltration radial was placed Catheter size: 20 G Hand hygiene performed  and maximum sterile barriers used   Attempts: 1 Procedure performed using ultrasound guided technique. Ultrasound Notes:anatomy identified, needle tip was noted to be adjacent to the nerve/plexus identified and no ultrasound evidence of intravascular and/or intraneural injection Following insertion, dressing applied and Biopatch. Post procedure assessment: normal and unchanged

## 2023-08-23 NOTE — Plan of Care (Signed)
  Problem: Education: Goal: Knowledge of General Education information will improve Description: Including pain rating scale, medication(s)/side effects and non-pharmacologic comfort measures Outcome: Progressing   Problem: Clinical Measurements: Goal: Ability to maintain clinical measurements within normal limits will improve Outcome: Progressing Goal: Will remain free from infection Outcome: Progressing Goal: Diagnostic test results will improve Outcome: Progressing Goal: Respiratory complications will improve Outcome: Progressing Goal: Cardiovascular complication will be avoided Outcome: Progressing   Problem: Activity: Goal: Risk for activity intolerance will decrease Outcome: Progressing   Problem: Nutrition: Goal: Adequate nutrition will be maintained Outcome: Progressing   Problem: Coping: Goal: Level of anxiety will decrease Outcome: Progressing   Problem: Elimination: Goal: Will not experience complications related to bowel motility Outcome: Progressing Goal: Will not experience complications related to urinary retention Outcome: Progressing   Problem: Pain Managment: Goal: General experience of comfort will improve and/or be controlled Outcome: Progressing   Problem: Safety: Goal: Ability to remain free from injury will improve Outcome: Progressing   Problem: Skin Integrity: Goal: Risk for impaired skin integrity will decrease Outcome: Progressing   Problem: Education: Goal: Knowledge of discharge needs will improve Outcome: Progressing   Problem: Clinical Measurements: Goal: Postoperative complications will be avoided or minimized Outcome: Progressing   Problem: Respiratory: Goal: Will achieve and/or maintain a regular respiratory rate, without signs or symptoms of dyspnea Outcome: Progressing   Problem: Skin Integrity: Goal: Demonstration of wound healing without infection will improve Outcome: Progressing

## 2023-08-23 NOTE — Interval H&P Note (Signed)
 History and Physical Interval Note:  08/23/2023 7:11 AM  Matthew Moreno  has presented today for surgery, with the diagnosis of left carotid stenosis.  The various methods of treatment have been discussed with the patient and family. After consideration of risks, benefits and other options for treatment, the patient has consented to  Procedure(s): TRANSCAROTID ARTERY REVASCULARIZATION (TCAR) (Left) as a surgical intervention.  The patient's history has been reviewed, patient examined, no change in status, stable for surgery.  I have reviewed the patient's chart and labs.  Questions were answered to the patient's satisfaction.     Leonie Douglas

## 2023-08-23 NOTE — Plan of Care (Signed)
  Problem: Clinical Measurements: Goal: Cardiovascular complication will be avoided Outcome: Progressing   Problem: Clinical Measurements: Goal: Will remain free from infection Outcome: Progressing   Problem: Nutrition: Goal: Adequate nutrition will be maintained Outcome: Progressing   Problem: Pain Managment: Goal: General experience of comfort will improve and/or be controlled Outcome: Progressing   Problem: Skin Integrity: Goal: Risk for impaired skin integrity will decrease Outcome: Progressing   Problem: Safety: Goal: Ability to remain free from injury will improve Outcome: Progressing   Problem: Skin Integrity: Goal: Demonstration of wound healing without infection will improve Outcome: Progressing

## 2023-08-23 NOTE — Anesthesia Procedure Notes (Signed)
 Procedure Name: Intubation Date/Time: 08/23/2023 7:45 AM  Performed by: Loleta Amaiya Scruton, CRNAPre-anesthesia Checklist: Patient identified, Patient being monitored, Timeout performed, Emergency Drugs available and Suction available Patient Re-evaluated:Patient Re-evaluated prior to induction Oxygen Delivery Method: Circle system utilized Preoxygenation: Pre-oxygenation with 100% oxygen Induction Type: IV induction Ventilation: Mask ventilation without difficulty Laryngoscope Size: Mac and 4 Grade View: Grade I Tube type: Oral Tube size: 7.5 mm Number of attempts: 1 Airway Equipment and Method: Stylet Placement Confirmation: ETT inserted through vocal cords under direct vision, positive ETCO2 and breath sounds checked- equal and bilateral Secured at: 23 cm Tube secured with: Tape Dental Injury: Teeth and Oropharynx as per pre-operative assessment

## 2023-08-23 NOTE — Progress Notes (Signed)
 Patient received from cathlab, V/S obtained, CCMD notified, CHG bath given, vascular site is level 0, NIH 0, all needs met, call bell in reach.   08/23/23 1316  Vitals  Temp 97.6 F (36.4 C)  Temp Source Oral  BP (!) 147/73  MAP (mmHg) 96  BP Location Right Arm  BP Method Automatic  Patient Position (if appropriate) Lying  Pulse Rate 80  Pulse Rate Source Monitor  ECG Heart Rate 75  Resp 18  Level of Consciousness  Level of Consciousness Alert  MEWS COLOR  MEWS Score Color Green  Oxygen Therapy  SpO2 98 %  O2 Device Room Air  Pain Assessment  Pain Scale 0-10  Pain Score 0  MEWS Score  MEWS Temp 0  MEWS Systolic 0  MEWS Pulse 0  MEWS RR 0  MEWS LOC 0  MEWS Score 0

## 2023-08-23 NOTE — Op Note (Signed)
 DATE OF SERVICE: 08/23/2023  PATIENT:  Matthew Moreno  78 y.o. male  PRE-OPERATIVE DIAGNOSIS:  asymptomatic, critical left carotid artery stenosis  POST-OPERATIVE DIAGNOSIS:  Same  PROCEDURE:   Left transcarotid artery revascularization  SURGEON:  Surgeons and Role:    * Leonie Douglas, MD - Primary  ASSISTANT: Aggie Moats, PA-C  An experienced assistant was required given the complexity of this procedure and the standard of surgical care. My assistant helped with exposure through counter tension, suctioning, ligation and retraction to better visualize the surgical field.  My assistant expedited sewing during the case by following my sutures. Wherever I use the term "we" in the report, my assistant actively helped me with that portion of the procedure.  ANESTHESIA:   general  EBL: 50mL  BLOOD ADMINISTERED:none  DRAINS: none   LOCAL MEDICATIONS USED:  NONE  SPECIMEN:  none  COUNTS: confirmed correct.  TOURNIQUET:  none  PATIENT DISPOSITION:  PACU - hemodynamically stable.   Delay start of Pharmacological VTE agent (>24hrs) due to surgical blood loss or risk of bleeding: no  INDICATION FOR PROCEDURE: [name] is a 78 y.o. male with asymptomatic critical left carotid artery stenosis. After careful discussion of risks, benefits, and alternatives the patient was offered left transcarotid artery revascularization. We specifically discussed risk of stroke, cranial nerve injury, and hematoma. The patient understood and wished to proceed.  OPERATIVE FINDINGS: tortuous carotids, as expected. Good technical result from TCAR with resolution of carotid stenosis. Patient awoke at neurologic baseline in OR. Exam reconfirmed in PACU.  DESCRIPTION OF PROCEDURE: After identification of the patient in the pre-operative holding area, the patient was transferred to the operating room. The patient was positioned supine on the operating room table. Anesthesia was induced. The left neck  and groins were prepped and draped in standard fashion. A surgical pause was performed confirming correct patient, procedure, and operative location.  Using intraoperative ultrasound the course of the left common carotid artery was mapped on the skin.  A transverse incision was made between the sternal and clavicular heads of the sternocleidomastoid muscle, below the omohyoid. Following longitudinal division of the carotid sheath the jugular vein was partially skeletonized and retracted medially. Once 3 cm of common carotid artery (CCA) were isolated, umbilical tape was placed around the proximal 1/3 of the CCA under direct vision. A 5-O Prolene suture was pre-placed in the anterior wall of the CCA, in a "U stitch" configuration, close to the clavicle to facilitate hemostasis upon removal of the arterial sheath at completion of the TCAR procedure.   The contralateral common femoral vein (CFV) was accessed under ultrasound guidance, using standard Seldinger and micropuncture access technique. The venous return sheath was advanced into the CFV over the 0.035" wire provided. Blood was aspirated from the flow line followed by flushing of the Venous Sheath with heparinized saline. The Venous Sheath was secured to the patient's skin with suture to maintain optimal position in the vessel. Heparin was given to obtain a therapeutic activated clotting time >250 seconds prior to arterial access.   A 4-French non-stiffened ENHANCE Transcarotid / Peripheral Access set was used, puncturing the artery with the 21G needle through the pre-placed "U" stitch while holding gentle traction on the umbilical tape to stabilize and centralize the CCA within the incision. Careful attention was paid to the change in CCA shape when using the umbilical tape to control or lift the artery. The micropuncture wire was then advanced 3-4 cm into the CCA and,  the 21G needle was removed. The micropuncture sheath was advanced 2-3 cm into the CCA  and the wire and dilator were removed. Pulsatile backflow indicated correct positioning. The provided 0.035" J-tipped guidewire was inserted as close as possible to the bifurcation without engaging the lesion. After micropuncture sheath removal, the Transcarotid Arterial Sheath was advanced to the 2.5cm marker and the 0.035" wire and dilator were then removed. Arterial Sheath position was assessed under fluoroscopy in two projections to ensure that the sheath tip was oriented coaxially in the CCA. The Arterial Sheath was sutured to the patient with gentle forward tension. Blood was slowly aspirated followed by flushing with heparinized saline. No ingress of air bubbles through the passive hemostatic valve was observed. The stopcocks were closed. Traction applied to the CCA previously to facilitate access was gently released.  The Flow Controller was connected to the Transcarotid Arterial Sheath, prepared by passively allowing a column of arterial blood to fill the line and connected to the Venous Return Sheath. CCA inflow was occluded proximal to the arteriotomy with a vascular clamp to achieve active flow reversal. To confirm flow reversal, a saline bolus was delivered into the venous flow line on both "High" and "Low" flow settings of the Flow Controller. Angiograms were performed with slow injections of a small amount of contrast filling just past the lesion to minimize antegrade transmission of micro-bubbles.   Prior to lesion manipulation, heart rate (70bpm) and systolic BP (140-140mmHg) were managed upwards to optimize flow reversal and procedural neuroprotection. The lesion was crossed with an 0.014" ENROUTE guidewire and pre-dilation of the lesion was performed with a 5x63mm rapid exchange 0.014" compatible balloon catheter to 8 atmospheres for 10 seconds. Stenting was performed with an 7-82mm x 40mm ENROUTE Transcarotid stent, sized appropriately to the right CCA.    AP angiogram (gentle contrast  injections) were performed to confirm stent placement and arterial wall stent apposition. At [facility] case completion, antegrade flow was restored by releasing the clamp on the CCA then closing the NPS stopcocks to the flow lines. The Transcarotid Arterial Sheath was removed and the pre-closure suture was tied. Heparin reversal was employed. The Venous Return Sheath was removed and hemostasis was achieved with brief manual compression.   Doppler flow was confirmed to be normal across the access site. The neck incision was closed in layers using 3-O vicryl and 4-O monocryl. Clean bandage was applied to the neck.   Upon completion of the case instrument and sharps counts were confirmed correct. The patient was transferred to the PACU in good condition. I was present for all portions of the procedure.  FOLLOW UP PLAN: Assuming a normal postoperative course, I will see the patient in 4 weeks with carotid duplex.   Rande Brunt. Lenell Antu, MD [facility] Phone Number: [phone] 08/23/2023 9:42 AM

## 2023-08-23 NOTE — Transfer of Care (Signed)
 Immediate Anesthesia Transfer of Care Note  Patient: Matthew Moreno  Procedure(s) Performed: LEFT TRANSCAROTID ARTERY REVASCULARIZATION (TCAR) (Left: Neck) ULTRASOUND GUIDANCE, FOR VASCULAR ACCESS, RIGHT FEMORAL VEIN (Right: Groin)  Patient Location: PACU  Anesthesia Type:General  Level of Consciousness: awake  Airway & Oxygen Therapy: Patient Spontanous Breathing  Post-op Assessment: Report given to RN and Post -op Vital signs reviewed and stable  Post vital signs: Reviewed and stable  Last Vitals:  Vitals Value Taken Time  BP 144/75 08/23/23 0930  Temp    Pulse 79 08/23/23 0933  Resp 15 08/23/23 0933  SpO2 99 % 08/23/23 0933  Vitals shown include unfiled device data.  Last Pain:  Vitals:   08/23/23 0608  PainSc: 0-No pain      Patients Stated Pain Goal: 0 (08/23/23 9147)  Complications: No notable events documented.

## 2023-08-24 ENCOUNTER — Encounter (HOSPITAL_COMMUNITY): Payer: Self-pay | Admitting: Vascular Surgery

## 2023-08-24 ENCOUNTER — Other Ambulatory Visit (HOSPITAL_COMMUNITY): Payer: Self-pay

## 2023-08-24 LAB — CBC
HCT: 36.3 % — ABNORMAL LOW (ref 39.0–52.0)
Hemoglobin: 12.7 g/dL — ABNORMAL LOW (ref 13.0–17.0)
MCH: 31.8 pg (ref 26.0–34.0)
MCHC: 35 g/dL (ref 30.0–36.0)
MCV: 91 fL (ref 80.0–100.0)
Platelets: 350 10*3/uL (ref 150–400)
RBC: 3.99 MIL/uL — ABNORMAL LOW (ref 4.22–5.81)
RDW: 12.5 % (ref 11.5–15.5)
WBC: 13.7 10*3/uL — ABNORMAL HIGH (ref 4.0–10.5)
nRBC: 0 % (ref 0.0–0.2)

## 2023-08-24 LAB — BASIC METABOLIC PANEL WITH GFR
Anion gap: 9 (ref 5–15)
BUN: 14 mg/dL (ref 8–23)
CO2: 20 mmol/L — ABNORMAL LOW (ref 22–32)
Calcium: 8.8 mg/dL — ABNORMAL LOW (ref 8.9–10.3)
Chloride: 109 mmol/L (ref 98–111)
Creatinine, Ser: 1.16 mg/dL (ref 0.61–1.24)
GFR, Estimated: >60 mL/min (ref >60)
Glucose, Bld: 125 mg/dL — ABNORMAL HIGH (ref 70–99)
Potassium: 4.1 mmol/L (ref 3.5–5.1)
Sodium: 138 mmol/L (ref 135–145)

## 2023-08-24 MED ORDER — OXYCODONE-ACETAMINOPHEN 5-325 MG PO TABS
1.0000 | ORAL_TABLET | Freq: Four times a day (QID) | ORAL | 0 refills | Status: DC | PRN
  Filled 2023-08-24: qty 10, 3d supply, fill #0

## 2023-08-24 NOTE — Progress Notes (Signed)
   08/24/23 1003  TOC Brief Assessment  Insurance and Status Reviewed  Patient has primary care physician Yes  Home environment has been reviewed home w/ brother  Prior level of function: self  Prior/Current Home Services No current home services  Social Drivers of Health Review SDOH reviewed no interventions necessary  Readmission risk has been reviewed Yes  Transition of care needs no transition of care needs at this time    S/P TCAR, stable for transition home today, Family to transport home. No HH or DME needs noted.

## 2023-08-24 NOTE — Anesthesia Postprocedure Evaluation (Signed)
 Anesthesia Post Note  Patient: Matthew Moreno  Procedure(s) Performed: LEFT TRANSCAROTID ARTERY REVASCULARIZATION (TCAR) (Left: Neck) ULTRASOUND GUIDANCE, FOR VASCULAR ACCESS, RIGHT FEMORAL VEIN (Right: Groin)     Patient location during evaluation: PACU Anesthesia Type: General Level of consciousness: awake and alert Pain management: pain level controlled Vital Signs Assessment: post-procedure vital signs reviewed and stable Respiratory status: spontaneous breathing, nonlabored ventilation, respiratory function stable and patient connected to nasal cannula oxygen Cardiovascular status: blood pressure returned to baseline and stable Postop Assessment: no apparent nausea or vomiting Anesthetic complications: no   No notable events documented.  Last Vitals:  Vitals:   08/24/23 0500 08/24/23 0600  BP: (!) 126/58 127/62  Pulse: 63 64  Resp:    Temp:    SpO2: 97% 96%    Last Pain:  Vitals:   08/24/23 0300  TempSrc: Oral  PainSc:                  [name]

## 2023-08-24 NOTE — Discharge Instructions (Signed)
   [facility]  Discharge Instructions   Carotid Endarterectomy (CEA)  Please refer to the following instructions for your post-procedure care. Your surgeon or physician assistant will discuss any changes with you.  Activity  You are encouraged to walk as much as you can. You can slowly return to normal activities but must avoid strenuous activity and heavy lifting until your doctor tell you it's OK. Avoid activities such as vacuuming or swinging a golf club. You can drive after one week if you are comfortable and you are no longer taking prescription pain medications. It is normal to feel tired for serval weeks after your surgery. It is also normal to have difficulty with sleep habits, eating, and bowel movements after surgery. These will go away with time.  Bathing/Showering  You may shower after you come home. Do not soak in a bathtub, hot tub, or swim until the incision heals completely.  Incision Care  Shower every day. Clean your incision with mild soap and water. Pat the area dry with a clean towel. You do not need a bandage unless otherwise instructed. Do not apply any ointments or creams to your incision. You may have skin glue on your incision. Do not peel it off. It will come off on its own in about one week. Your incision may feel thickened and raised for several weeks after your surgery. This is normal and the skin will soften over time. For Men Only: It's OK to shave around the incision but do not shave the incision itself for 2 weeks. It is common to have numbness under your chin that could last for several months.  Diet  Resume your normal diet. There are no special food restrictions following this procedure. A low fat/low cholesterol diet is recommended for all patients with vascular disease. In order to heal from your surgery, it is CRITICAL to get adequate nutrition. Your body requires vitamins, minerals, and protein. Vegetables are the best  source of vitamins and minerals. Vegetables also provide the perfect balance of protein. Processed food has little nutritional value, so try to avoid this.        Medications  Resume taking all of your medications unless your doctor or physician assistant tells you not to. If your incision is causing pain, you may take over-the- counter pain relievers such as acetaminophen (Tylenol). If you were prescribed a stronger pain medication, please be aware these medications can cause nausea and constipation. Prevent nausea by taking the medication with a snack or meal. Avoid constipation by drinking plenty of fluids and eating foods with a high amount of fiber, such as fruits, vegetables, and grains. Do not take Tylenol if you are taking prescription pain medications.  Follow Up  Our office will schedule a follow up appointment 2-3 weeks following discharge.  Please call us immediately for any of the following conditions  Increased pain, redness, drainage (pus) from your incision site. Fever of 101 degrees or higher. If you should develop stroke (slurred speech, difficulty swallowing, weakness on one side of your body, loss of vision) you should call 911 and go to the nearest emergency room.  Reduce your risk of vascular disease:  Stop smoking. If you would like help call [facility] at 1-800-QUIT-NOW ((910) 853-8047) or [place] at [phone]. Manage your cholesterol Maintain a desired weight Control your diabetes Keep your blood pressure down  If you have any questions, please call the office at [phone].

## 2023-08-24 NOTE — Progress Notes (Signed)
 [facility] [place] PROGRESS NOTE  ASSESSMENT / PLAN: Matthew Moreno is a 78 y.o. male s/p L TCAR for asymptomatic, critical stenosis in setting of prior head and neck radiation 08/24/23. No issues postoperatively. Safe for discharge. Continue DAPT. Continue statin. Follow up with me in 1 month to discuss R TCAR.    SUBJECTIVE: No complaints. Ate dinner without difficulty. No difficulty speaking. Moving all extremities equally.  OBJECTIVE: BP 127/62   Pulse 64   Temp 98.4 F (36.9 C) (Oral)   Resp 19   Ht 5\' 7"  (1.702 m)   Wt 72.6 kg   SpO2 96%   BMI 25.06 kg/m   Intake/Output Summary (Last 24 hours) at 08/24/2023 0800 Last data filed at 08/24/2023 0700 Gross per 24 hour  Intake 2586.96 ml  Output 2270 ml  Net 316.96 ml    No distress Regular rate and rhythm Unlabored breathing Neck incision healing well No CN issues Moving all extremities without difficulty.     Latest Ref Rng & Units 08/24/2023    4:20 AM 08/13/2023    2:00 PM 12/23/2022   10:19 AM  CBC  WBC 4.0 - 10.5 K/uL 13.7  6.4  7.3   Hemoglobin 13.0 - 17.0 g/dL 16.1  09.6  04.5   Hematocrit 39.0 - 52.0 % 36.3  44.3  46.2   Platelets 150 - 400 K/uL 350  336  295         Latest Ref Rng & Units 08/24/2023    4:20 AM 08/13/2023    2:00 PM 12/23/2022   10:19 AM  CMP  Glucose 70 - 99 mg/dL 409  94  99   BUN 8 - 23 mg/dL 14  13  17    Creatinine 0.61 - 1.24 mg/dL 8.11  9.14  7.82   Sodium 135 - 145 mmol/L 138  144  143   Potassium 3.5 - 5.1 mmol/L 4.1  4.4  4.7   Chloride 98 - 111 mmol/L 109  108  106   CO2 22 - 32 mmol/L 20  26  20    Calcium 8.9 - 10.3 mg/dL 8.8  9.7  95.6   Total Protein 6.5 - 8.1 g/dL  7.8  7.3   Total Bilirubin 0.0 - 1.2 mg/dL  0.6  0.3   Alkaline Phos 38 - 126 U/L  48  64   AST 15 - 41 U/L  31  25   ALT 0 - 44 U/L  20  13     Estimated Creatinine Clearance: 49.9 mL/min (by C-G formula based on SCr of 1.16 mg/dL).  Rande Brunt. Lenell Antu, MD [facility] Phone Number: [phone] 08/24/2023 8:00 AM

## 2023-08-24 NOTE — Progress Notes (Signed)
 Discharge education provided, CCMD notified, PIV removed, belongings taken by the patient, no any complains.

## 2023-08-25 ENCOUNTER — Telehealth: Payer: Self-pay

## 2023-08-25 LAB — POCT ACTIVATED CLOTTING TIME
Activated Clotting Time: 239 s
Activated Clotting Time: 268 s

## 2023-08-25 NOTE — Telephone Encounter (Signed)
 Advice: -pt called asking about taking his Plavix after discharge. -noted Plavix not checked on discharge summary. -clarified with pt to continue taking Plavix as originally directed

## 2023-08-25 NOTE — Discharge Summary (Signed)
 Discharge Summary     ORIS STAFFIERI April 15, 1946 78 y.o. male  [id number]  Admission Date: 08/23/2023  Discharge Date: 08/24/23  Physician: Dr. Lenell Antu  Admission Diagnosis: Stenosis of left carotid artery [I65.22] Carotid stenosis [I65.29] Carotid artery stenosis [I65.29]  Discharge Day services:   See progress note 08/24/23  Hospital Course:  Mr. Kharee Lesesne is a 78 year old male who was brought in as an outpatient and underwent left-sided TCAR for asymptomatic high-grade stenosis of the carotid artery with history of neck radiation.  This was performed by Dr. Lenell Antu on 08/23/2023.  He tolerated the procedure well and was admitted to the hospital postoperatively.  POD #1 he did not experience any strokelike symptoms postoperatively.  The left neck incision was well-appearing and he was ready for discharge home.  He will continue his aspirin, Plavix, statin daily.  He was prescribed 1 to 2 days of narcotic pain medication for continued postoperative pain control.  He will follow-up in the office in 1 month with a carotid duplex with Dr. Lenell Antu to discuss right sided TCAR.  He was discharged home in stable condition.   Recent Labs    08/24/23 0420  NA 138  K 4.1  CL 109  CO2 20*  GLUCOSE 125*  BUN 14  CALCIUM 8.8*   Recent Labs    08/24/23 0420  WBC 13.7*  HGB 12.7*  HCT 36.3*  PLT 350   No results for input(s): "INR" in the last 72 hours.     Discharge Diagnosis:  Stenosis of left carotid artery [I65.22] Carotid stenosis [I65.29] Carotid artery stenosis [I65.29]  Secondary Diagnosis: Patient Active Problem List   Diagnosis Date Noted   Carotid stenosis 08/23/2023   Carotid artery stenosis 08/23/2023   Squamous cell carcinoma in situ of skin 01/06/2023   Carotid stenosis, asymptomatic, bilateral 12/23/2022   Basal cell carcinoma of scalp 01/14/2022   Statin myopathy 04/09/2020   History of COVID-19 01/14/2020   History of shingles 07/13/2019   Benign  prostatic hyperplasia with incomplete bladder emptying 07/13/2019   Stage 3a chronic kidney disease (HCC) 07/13/2019   Actinic keratosis 07/13/2019   H/O bilateral cataract extraction 03/30/2017   Essential hypertension 01/11/2017   Arthritis 11/11/2015   Family history of heart disease 11/11/2015   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   Hypertriglyceridemia 06/23/2011   Coronary artery disease involving native coronary artery of native heart without angina pectoris 06/23/2011   Former smoker 06/23/2011   GERD (gastroesophageal reflux disease) 03/29/2009   Past Medical History:  Diagnosis Date   Arthritis    bilateral hands   Basal cell carcinoma of scalp 01/14/2022   CAD (coronary artery disease) 2008   COPD (chronic obstructive pulmonary disease) (HCC)    Dyslipidemia    Dysphagia    Family history of heart disease    GERD (gastroesophageal reflux disease)    History of kidney stones    Hypertension    Panic attacks    Peripheral vascular disease (HCC)    Carotid Stenosis   S/P radiation therapy 02/12/2014-04/02/2014   Left tonsil and bilateral neck / 70 Gy in 35 fractions to gross disease, 63 Gy in 35 fractions to high risk nodalechelons, and 56 Gy in 35 fractions to intermediate risk nodal echelons    Statin intolerance    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   amLODipine 5 MG tablet Commonly known as: NORVASC Take 1 tablet (5 mg total) by mouth daily.    aspirin 81 MG tablet Take 81 mg by mouth daily.   clopidogrel 75 MG tablet Commonly known as: PLAVIX Take 1 tablet (75 mg total) by mouth daily.   ezetimibe 10 MG tablet Commonly known as: ZETIA Take 1 tablet (10 mg total) by mouth daily.   fenofibrate 160 MG tablet TAKE 1 TABLET(160 MG) BY MOUTH DAILY   fexofenadine 180 MG tablet Commonly known as: ALLEGRA Take 180 mg by mouth daily as needed for allergies or rhinitis.   metoprolol tartrate 25 MG tablet Commonly known as: LOPRESSOR 1/2 tablet twice a day   omeprazole 20 MG capsule Commonly known as: PRILOSEC Take 20 mg by mouth every 3 (three) days.   oxyCODONE-acetaminophen 5-325 MG tablet Commonly known as: PERCOCET/ROXICET Take 1 tablet by mouth every 6 (six) hours as needed for moderate pain (pain score 4-6).   Ventolin HFA 108 (90 Base) MCG/ACT inhaler Generic drug: albuterol INHALE 2 PUFFS INTO THE LUNGS EVERY 6 HOURS AS NEEDED FOR WHEEZING OR SHORTNESS OF BREATH   Zypitamag 4 MG Tabs Generic drug: Pitavastatin Magnesium TAKE 1 TABLET BY MOUTH EVERY DAY         Discharge Instructions:   [facility] Discharge Instructions Carotid Endarterectomy (CEA)  Please refer to the following instructions for your post-procedure care. Your surgeon or physician assistant will discuss any changes with you.  Activity  You are encouraged to walk as much as you can. You can slowly return to normal activities but must avoid strenuous activity and heavy lifting until your doctor tell you it's OK. Avoid activities such as vacuuming or swinging a golf club. You can drive after one week if you are comfortable and you are no longer taking prescription pain medications. It is normal to feel tired for serval weeks after your surgery. It is also normal to have difficulty with sleep habits, eating, and bowel movements after surgery. These will go away with time.  Bathing/Showering  You may shower  after you come home. Do not soak in a bathtub, hot tub, or swim until the incision heals completely.  Incision Care  Shower every day. Clean your incision with mild soap and water. Pat the area dry with a clean towel. You do not need a bandage unless otherwise instructed. Do not apply any ointments or creams to your incision. You may have skin glue on your incision. Do not peel it off. It will come off on its own in about one week. Your incision may feel thickened and raised for several weeks after your surgery. This is normal and the skin will soften over time. For Men Only: It's OK to shave around the incision but do not shave the incision itself for 2 weeks. It is common to have numbness under your chin that could last for several months.  Diet  Resume your normal diet. There are no special food restrictions following this procedure. A low fat/low cholesterol diet is recommended for all patients with vascular disease. In order to heal from your surgery, it is CRITICAL to get adequate nutrition. Your body requires vitamins, minerals, and protein. Vegetables are the best source of vitamins and minerals. Vegetables also provide the perfect balance of protein. Processed food has little nutritional value, so try to avoid this.  Medications  Resume taking all of your medications unless your doctor or physician assistant tells you not to.  If your incision is causing pain, you may take over-the- counter pain relievers such as acetaminophen (Tylenol). If you were prescribed a stronger pain medication, please be aware these medications can cause nausea and constipation.  Prevent nausea by taking the medication with a snack or meal. Avoid constipation by drinking plenty of fluids and eating foods with a high amount of fiber, such as fruits, vegetables, and grains. Do not take Tylenol if you are taking prescription pain medications.  Follow Up  Our office will schedule a follow up appointment 2-3 weeks  following discharge.  Please call us immediately for any of the following conditions  Increased pain, redness, drainage (pus) from your incision site. Fever of 101 degrees or higher. If you should develop stroke (slurred speech, difficulty swallowing, weakness on one side of your body, loss of vision) you should call 911 and go to the nearest emergency room.  Reduce your risk of vascular disease:  Stop smoking. If you would like help call [facility] at 1-800-QUIT-NOW (838-217-8199) or [place] at 623 032 1093. Manage your cholesterol Maintain a desired weight Control your diabetes Keep your blood pressure down  If you have any questions, please call the office at [phone].  Disposition: home  Patient's condition: is Good  Follow up: 1. Dr. Lenell Antu in 4 weeks.   Emilie Rutter, PA-C [facility] [phone]   --- For [facility] use ---   Modified Rankin score at D/C (0-6): 0  IV medication needed for:  1. Hypertension: No 2. Hypotension: No  Post-op Complications: No  1. Post-op CVA or TIA: No  If yes: Event classification (right eye, left eye, right cortical, left cortical, verterobasilar, other):   If yes: Timing of event (intra-op, <6 hrs post-op, >=6 hrs post-op, unknown):   2. CN injury: No  If yes: CN  injuried   3. Myocardial infarction: No  If yes: Dx by (EKG or clinical, Troponin):   4.  CHF: No  5.  Dysrhythmia (new): No  6. Wound infection: No  7. Reperfusion symptoms: No  8. Return to OR: No  If yes: return to OR for (bleeding, neurologic, other CEA incision, other):   Discharge medications: Statin use:  Yes ASA use:  Yes   Beta blocker use:  Yes ACE-Inhibitor use:  No  ARB use:  No CCB use: Yes P2Y12 Antagonist use: Yes, [ x] Plavix, [ ]  Plasugrel, [ ]  Ticlopinine, [ ]  Ticagrelor, [ ]  Other, [ ]  No for medical reason, [ ]  Non-compliant, [ ]  Not-indicated Anti-coagulant use:  No, [ ]  Warfarin, [ ]   Rivaroxaban, [ ]  Dabigatran,

## 2023-10-01 ENCOUNTER — Other Ambulatory Visit: Payer: Self-pay | Admitting: *Deleted

## 2023-10-01 DIAGNOSIS — I6523 Occlusion and stenosis of bilateral carotid arteries: Secondary | ICD-10-CM

## 2023-10-11 NOTE — Progress Notes (Unsigned)
 [facility] [place]  ASSESSMENT / PLAN: 78 y.o. male with asymptomatic bilateral (>80%) carotid artery stenosis.    The patient should continue best medical therapy for carotid artery stenosis including: Complete cessation from all tobacco products. Blood glucose control with goal A1c < 7%. Blood pressure control with goal blood pressure < 140/90 mmHg. Lipid reduction therapy with goal LDL-C <100 mg/dL  Aspirin  81mg  PO QD.  Clopidogrel  75mg  by mouth daily Atorvastatin 40-80mg  PO QD (or other "high intensity" statin therapy).   CT angiogram shows highly tortuous internal carotid arteries.   Will plan to treat left side first.   I have completed Share Decision Making with Alayne Hubert prior to surgery.  Conversations included: -Discussion of all treatment options including carotid endarterectomy (CEA), CAS (which includes transcarotid artery revascularization (TCAR)), and optimal medical therapy (OMT)). -Explanation of risks and benefits for each option specific to [name] clinical situation. -Integration of clinical guidelines as it relates to the patient's history and co-morbidities -Discussion and incorporation of Alayne Hubert and their personal preferences and priorities in choosing a treatment plan.  His irradiated neck and need for potentially complicated open surgery (I.e. carotid shortening + endarterectomy) expose him to a higher than average risk of cranial nerve injury. I think TCAR would be best for him and he agrees, will review scan with [place].   CHIEF COMPLAINT: follow up carotid stenosis  HISTORY OF PRESENT ILLNESS: Matthew ALDREDGE is a 78 y.o. male who presents to clinic for discussion of recent carotid duplex that showed carotid artery stenosis bilaterally.  The patient reports the carotid duplex was precipitated by intermittent dizziness associated with positional changes of the head.  Patient reports no focal symptoms.  He  denies amaurosis, facial droop, dysarthria, unilateral weakness or numbness.  He has never had a stroke or mini stroke before.   07/14/22: Returns to clinic for surveillance.  He is doing well overall.  No focal neurologic symptoms.  We reviewed his duplex today.  01/12/23: returns to clinic for surveillance. Denies interval CVA/TIA or symptoms typical of same. We reviewed his duplex.  07/13/23: Doing well overall.  No interval stroke or mini stroke.  I did review his significant change in his duplex today with him in detail.  08/10/23: Patient returns after CT angiogram.  This showed both significant stenosis bilaterally and looping internal carotid arteries.  Final read is still pending on the CT scan.  I counseled the patient that his severe stenosis merits asymptomatic intervention.  We reviewed the 3 modalities of intervention.  We both agreed that TCAR would be the lowest risk option for him.  Past Medical History:  Diagnosis Date   Arthritis    bilateral hands   Basal cell carcinoma of scalp 01/14/2022   CAD (coronary artery disease) 2008   COPD (chronic obstructive pulmonary disease) (HCC)    Dyslipidemia    Dysphagia    Family history of heart disease    GERD (gastroesophageal reflux disease)    History of kidney stones    Hypertension    Panic attacks    Peripheral vascular disease (HCC)    Carotid Stenosis   S/P radiation therapy 02/12/2014-04/02/2014   Left tonsil and bilateral neck / 70 Gy in 35 fractions to gross disease, 63 Gy in 35 fractions to high risk nodalechelons, and 56 Gy in 35 fractions to intermediate risk nodal echelons    Statin intolerance    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 Rande Bushy)   COLONOSCOPY  2010   Dr. Howard Macho   ESOPHAGOGASTRODUODENOSCOPY N/A 09/04/2018   Procedure: ESOPHAGOGASTRODUODENOSCOPY (EGD);  Surgeon: Alvis Jourdain, MD;  Location[facility] ENDOSCOPY;  Service: Endoscopy;  Laterality: N/A;   EYE SURGERY Bilateral    Cataract   FOREIGN BODY REMOVAL  09/04/2018   Procedure: FOREIGN BODY REMOVAL;  Surgeon: Alvis Jourdain, MD;  Location: [facility] ENDOSCOPY;  Service: Endoscopy;;   MULTIPLE TOOTH EXTRACTIONS     All top teeth and most of bottom teeth   SKIN BIOPSY Right 12/15/2021   basal cell carcinoma of the scalp   TRANSCAROTID ARTERY REVASCULARIZATION  Left 08/23/2023   Procedure: LEFT TRANSCAROTID ARTERY REVASCULARIZATION (TCAR);  Surgeon: Carlene Che, MD;  Location: [facility] OR;  Service: Vascular;  Laterality: Left;   ULTRASOUND GUIDANCE FOR VASCULAR ACCESS Right 08/23/2023   Procedure: ULTRASOUND GUIDANCE, FOR VASCULAR ACCESS, RIGHT FEMORAL VEIN;  Surgeon: Carlene Che, MD;  Location: [facility] OR;  Service: Vascular;  Laterality: Right;    Family History  Problem Relation Age of Onset   Heart Problems Mother    Heart Problems Father     Social History   Socioeconomic History   Marital status: Widowed    Spouse name: Not on file   Number of children: 2   Years of education: Not on file   Highest education level: Not on file  Occupational History   Occupation: pipe fitter    Employer: RETIRED    Comment: Retired  Tobacco Use   Smoking status: Former    Current packs/day: 0.00    Average packs/day: 0.8 packs/day for 20.0 years (15.0 ttl pk-yrs)    Types: Cigarettes    Start date: 11/02/1963    Quit date: 11/02/1983    Years since quitting: 39.9   Smokeless tobacco: Never   Tobacco comments:    Stopped Smoking ~ 20 years ago  Vaping Use   Vaping status: Never Used  Substance and Sexual Activity   Alcohol use: Not Currently    Alcohol/week: 1.0  standard drink of alcohol    Types: 1 Standard drinks or equivalent per week    Comment: .   Drug use: Yes    Types: Marijuana   Sexual activity: Not Currently  Other Topics Concern   Not on file  Social History Narrative   Patient is widowed. He had one son and one daughter. The son passed away from Tylenol  toxicity.   Social Drivers of Corporate investment banker Strain: Low Risk  (10/20/2022)   Overall Financial Resource Strain (CARDIA)    Difficulty of Paying Living Expenses: Not hard at all  Food Insecurity: No Food Insecurity (08/24/2023)   Hunger Vital Sign    Worried About Running Out of Food in the Last Year: Never true    Ran Out of Food in the Last Year: Never true  Transportation Needs: No Transportation Needs (08/24/2023)   PRAPARE - Administrator, Civil Service (Medical): No    Lack of Transportation (Non-Medical): No  Physical Activity: Sufficiently Active (10/20/2022)   Exercise  Vital Sign    Days of Exercise per Week: 7 days    Minutes of Exercise per Session: 30 min  Stress: No Stress Concern Present (10/20/2022)   [employer] of Occupational Health - Occupational Stress Questionnaire    Feeling of Stress : Not at all  Social Connections: Unknown (08/24/2023)   Social Connection and Isolation Panel [NHANES]    Frequency of Communication with Friends and Family: More than three times a week    Frequency of Social Gatherings with Friends and Family: More than three times a week    Attends Religious Services: Not on file    Active Member of Clubs or Organizations: Not on file    Attends Banker Meetings: Not on file    Marital Status: Not on file  Intimate Partner Violence: Unknown (08/24/2023)   Humiliation, Afraid, Rape, and Kick questionnaire    Fear of Current or Ex-Partner: No    Emotionally Abused: No    Physically Abused: Not on file    Sexually Abused: Not on file    Allergies  Allergen Reactions   Bystolic [Nebivolol Hcl]      Unknown reaction    Lipitor [Atorvastatin] Other (See Comments)    Muscle aches with statins   Lisinopril     Unknown reaction    Pravastatin     Muscle aches   Red Yeast Rice [Cholestin]     Muscle pain   Sulfonamide Derivatives Hives   Zocor [Simvastatin]     Muscle aches    Current Outpatient Medications  Medication Sig Dispense Refill   ALPRAZolam  (XANAX ) 0.5 MG tablet Take 1 tablet (0.5 mg total) by mouth at bedtime as needed. 30 tablet 1   amLODipine  (NORVASC ) 5 MG tablet Take 1 tablet (5 mg total) by mouth daily. 90 tablet 3   aspirin  81 MG tablet Take 81 mg by mouth daily.     clopidogrel  (PLAVIX ) 75 MG tablet Take 1 tablet (75 mg total) by mouth daily. 30 tablet 6   ezetimibe  (ZETIA ) 10 MG tablet Take 1 tablet (10 mg total) by mouth daily. 90 tablet 3   fenofibrate  160 MG tablet TAKE 1 TABLET(160 MG) BY MOUTH DAILY 90 tablet 3   fexofenadine (ALLEGRA) 180 MG tablet Take 180 mg by mouth daily as needed for allergies or rhinitis.     metoprolol  tartrate (LOPRESSOR ) 25 MG tablet 1/2 tablet twice a day 90 tablet 3   omeprazole  (PRILOSEC) 20 MG capsule Take 20 mg by mouth every 3 (three) days.     oxyCODONE -acetaminophen  (PERCOCET/ROXICET) 5-325 MG tablet Take 1 tablet by mouth every 6 (six) hours as needed for moderate pain (pain score 4-6). 10 tablet 0   Pitavastatin  Magnesium  (ZYPITAMAG ) 4 MG TABS TAKE 1 TABLET BY MOUTH EVERY DAY 90 tablet 1   VENTOLIN  HFA 108 (90 Base) MCG/ACT inhaler INHALE 2 PUFFS INTO THE LUNGS EVERY 6 HOURS AS NEEDED FOR WHEEZING OR SHORTNESS OF BREATH 18 g 0   No current facility-administered medications for this visit.   Facility-Administered Medications Ordered in Other Visits  Medication Dose Route Frequency Provider Last Rate Last Admin   influenza  inactive virus vaccine (FLUZONE/FLUARIX) injection 0.5 mL  0.5 mL Intramuscular Once Lalonde, John C, MD        PHYSICAL EXAM There were no vitals filed for this visit.    Well appearing elderly  man Normal gait and station No extremity weakness  PERTINENT LABORATORY AND RADIOLOGIC DATA  Most recent CBC  Latest Ref Rng & Units 08/24/2023    4:20 AM 08/13/2023    2:00 PM 12/23/2022   10:19 AM  CBC  WBC 4.0 - 10.5 K/uL 13.7  6.4  7.3   Hemoglobin 13.0 - 17.0 g/dL 16.1  09.6  04.5   Hematocrit 39.0 - 52.0 % 36.3  44.3  46.2   Platelets 150 - 400 K/uL 350  336  295      Most recent CMP    Latest Ref Rng & Units 08/24/2023    4:20 AM 08/13/2023    2:00 PM 12/23/2022   10:19 AM  CMP  Glucose 70 - 99 mg/dL 409  94  99   BUN 8 - 23 mg/dL 14  13  17    Creatinine 0.61 - 1.24 mg/dL 8.11  9.14  7.82   Sodium 135 - 145 mmol/L 138  144  143   Potassium 3.5 - 5.1 mmol/L 4.1  4.4  4.7   Chloride 98 - 111 mmol/L 109  108  106   CO2 22 - 32 mmol/L 20  26  20    Calcium  8.9 - 10.3 mg/dL 8.8  9.7  95.6   Total Protein 6.5 - 8.1 g/dL  7.8  7.3   Total Bilirubin 0.0 - 1.2 mg/dL  0.6  0.3   Alkaline Phos 38 - 126 U/L  48  64   AST 15 - 41 U/L  31  25   ALT 0 - 44 U/L  20  13     Renal function CrCl cannot be calculated (Patient's most recent lab result is older than the maximum 21 days allowed.).  No results found for: "HGBA1C"  LDL Cholesterol (Calc)  Date Value Ref Range Status  03/30/2017 130 (H) mg/dL (calc) Final    Comment:    Reference range: <100 . Desirable range <100 mg/dL for primary prevention;   <70 mg/dL for patients with CHD or diabetic patients  with > or = 2 CHD risk factors. Aaron Aas LDL-C is now calculated using the Martin-Hopkins  calculation, which is a validated novel method providing  better accuracy than the Friedewald equation in the  estimation of LDL-C.  Melinda Sprawls et al. Erroll Heard. 2130;865(78): 2061-2068  (http://education.[facility].com/faq/FAQ164)    LDL Chol Calc (NIH)  Date Value Ref Range Status  12/23/2022 79 0 - 99 mg/dL Final   Direct LDL  Date Value Ref Range Status  05/04/2016 67 <130 mg/dL Final    Comment:        Desirable range <100  mg/dL for patients with CHD or diabetes and <70 mg/dL for diabetic patients with known heart disease.      LDL Direct  Date Value Ref Range Status  03/07/2019 100 (H) 0 - 99 mg/dL Final    Right Carotid: Velocities in the right ICA are consistent with a 60-79%                 stenosis.   Left Carotid: Velocities in the left ICA are consistent with a 80-99%  stenosis.   Vertebrals: Bilateral vertebral arteries demonstrate antegrade flow.  Subclavians: Normal flow hemodynamics were seen in bilateral subclavian               arteries.   CT angiogram of head and neck:  Severe bilateral internal carotid artery stenosis (>80%). The right side may be amenable to stenting.  The left side appears more favorable for stenting.  He has highly tortuous internal carotid arteries bilaterally.  Heber Little. Edgardo Goodwill, MD FACS [facility] Phone Number: [phone] 10/11/2023 7:14 PM   Total time spent on preparing this encounter including chart review, data review, collecting history, examining the patient, coordinating care for this established patient, 30 minutes  Portions of this report may have been transcribed using voice recognition software.  Every effort has been made to ensure accuracy; however, inadvertent computerized transcription errors may still be present.

## 2023-10-11 NOTE — H&P (View-Only) (Signed)
 [facility] [place]  ASSESSMENT / PLAN: 78 y.o. male with asymptomatic bilateral (>80%) carotid artery stenosis.    The patient should continue best medical therapy for carotid artery stenosis including: Complete cessation from all tobacco products. Blood glucose control with goal A1c < 7%. Blood pressure control with goal blood pressure < 140/90 mmHg. Lipid reduction therapy with goal LDL-C <100 mg/dL  Aspirin  81mg  PO QD.  Clopidogrel  75mg  by mouth daily Atorvastatin 40-80mg  PO QD (or other "high intensity" statin therapy).   CT angiogram shows highly tortuous internal carotid arteries.   Will plan to treat left side first.   I have completed Share Decision Making with Alayne Hubert prior to surgery.  Conversations included: -Discussion of all treatment options including carotid endarterectomy (CEA), CAS (which includes transcarotid artery revascularization (TCAR)), and optimal medical therapy (OMT)). -Explanation of risks and benefits for each option specific to Matthew Moreno clinical situation. -Integration of clinical guidelines as it relates to the patient's history and co-morbidities -Discussion and incorporation of Alayne Hubert and their personal preferences and priorities in choosing a treatment plan.  His irradiated neck and need for potentially complicated open surgery (I.e. carotid shortening + endarterectomy) expose him to a higher than average risk of cranial nerve injury. I think TCAR would be best for him and he agrees, will review scan with [place].   CHIEF COMPLAINT: follow up carotid stenosis  HISTORY OF PRESENT ILLNESS: Matthew Moreno is a 78 y.o. male who presents to clinic for discussion of recent carotid duplex that showed carotid artery stenosis bilaterally.  The patient reports the carotid duplex was precipitated by intermittent dizziness associated with positional changes of the head.  Patient reports no focal symptoms.  He  denies amaurosis, facial droop, dysarthria, unilateral weakness or numbness.  He has never had a stroke or mini stroke before.   07/14/22: Returns to clinic for surveillance.  He is doing well overall.  No focal neurologic symptoms.  We reviewed his duplex today.  01/12/23: returns to clinic for surveillance. Denies interval CVA/TIA or symptoms typical of same. We reviewed his duplex.  07/13/23: Doing well overall.  No interval stroke or mini stroke.  I did review his significant change in his duplex today with him in detail.  08/10/23: Patient returns after CT angiogram.  This showed both significant stenosis bilaterally and looping internal carotid arteries.  Final read is still pending on the CT scan.  I counseled the patient that his severe stenosis merits asymptomatic intervention.  We reviewed the 3 modalities of intervention.  We both agreed that TCAR would be the lowest risk option for him.  Past Medical History:  Diagnosis Date   Arthritis    bilateral hands   Basal cell carcinoma of scalp 01/14/2022   CAD (coronary artery disease) 2008   COPD (chronic obstructive pulmonary disease) (HCC)    Dyslipidemia    Dysphagia    Family history of heart disease    GERD (gastroesophageal reflux disease)    History of kidney stones    Hypertension    Panic attacks    Peripheral vascular disease (HCC)    Carotid Stenosis   S/P radiation therapy 02/12/2014-04/02/2014   Left tonsil and bilateral neck / 70 Gy in 35 fractions to gross disease, 63 Gy in 35 fractions to high risk nodalechelons, and 56 Gy in 35 fractions to intermediate risk nodal echelons    Statin intolerance    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 Rande Bushy)   COLONOSCOPY  2010   Dr. Howard Macho   ESOPHAGOGASTRODUODENOSCOPY N/A 09/04/2018   Procedure: ESOPHAGOGASTRODUODENOSCOPY (EGD);  Surgeon: Alvis Jourdain, MD;  Location[facility] ENDOSCOPY;  Service: Endoscopy;  Laterality: N/A;   EYE SURGERY Bilateral    Cataract   FOREIGN BODY REMOVAL  09/04/2018   Procedure: FOREIGN BODY REMOVAL;  Surgeon: Alvis Jourdain, MD;  Location: [facility] ENDOSCOPY;  Service: Endoscopy;;   MULTIPLE TOOTH EXTRACTIONS     All top teeth and most of bottom teeth   SKIN BIOPSY Right 12/15/2021   basal cell carcinoma of the scalp   TRANSCAROTID ARTERY REVASCULARIZATION  Left 08/23/2023   Procedure: LEFT TRANSCAROTID ARTERY REVASCULARIZATION (TCAR);  Surgeon: Carlene Che, MD;  Location: [facility] OR;  Service: Vascular;  Laterality: Left;   ULTRASOUND GUIDANCE FOR VASCULAR ACCESS Right 08/23/2023   Procedure: ULTRASOUND GUIDANCE, FOR VASCULAR ACCESS, RIGHT FEMORAL VEIN;  Surgeon: Carlene Che, MD;  Location: [facility] OR;  Service: Vascular;  Laterality: Right;    Family History  Problem Relation Age of Onset   Heart Problems Mother    Heart Problems Father     Social History   Socioeconomic History   Marital status: Widowed    Spouse name: Not on file   Number of children: 2   Years of education: Not on file   Highest education level: Not on file  Occupational History   Occupation: pipe fitter    Employer: RETIRED    Comment: Retired  Tobacco Use   Smoking status: Former    Current packs/day: 0.00    Average packs/day: 0.8 packs/day for 20.0 years (15.0 ttl pk-yrs)    Types: Cigarettes    Start date: 11/02/1963    Quit date: 11/02/1983    Years since quitting: 39.9   Smokeless tobacco: Never   Tobacco comments:    Stopped Smoking ~ 20 years ago  Vaping Use   Vaping status: Never Used  Substance and Sexual Activity   Alcohol use: Not Currently    Alcohol/week: 1.0  standard drink of alcohol    Types: 1 Standard drinks or equivalent per week    Comment: .   Drug use: Yes    Types: Marijuana   Sexual activity: Not Currently  Other Topics Concern   Not on file  Social History Narrative   Patient is widowed. He had one son and one daughter. The son passed away from Tylenol  toxicity.   Social Drivers of Corporate investment banker Strain: Low Risk  (10/20/2022)   Overall Financial Resource Strain (CARDIA)    Difficulty of Paying Living Expenses: Not hard at all  Food Insecurity: No Food Insecurity (08/24/2023)   Hunger Vital Sign    Worried About Running Out of Food in the Last Year: Never true    Ran Out of Food in the Last Year: Never true  Transportation Needs: No Transportation Needs (08/24/2023)   PRAPARE - Administrator, Civil Service (Medical): No    Lack of Transportation (Non-Medical): No  Physical Activity: Sufficiently Active (10/20/2022)   Exercise  Vital Sign    Days of Exercise per Week: 7 days    Minutes of Exercise per Session: 30 min  Stress: No Stress Concern Present (10/20/2022)   [employer] of Occupational Health - Occupational Stress Questionnaire    Feeling of Stress : Not at all  Social Connections: Unknown (08/24/2023)   Social Connection and Isolation Panel [NHANES]    Frequency of Communication with Friends and Family: More than three times a week    Frequency of Social Gatherings with Friends and Family: More than three times a week    Attends Religious Services: Not on file    Active Member of Clubs or Organizations: Not on file    Attends Banker Meetings: Not on file    Marital Status: Not on file  Intimate Partner Violence: Unknown (08/24/2023)   Humiliation, Afraid, Rape, and Kick questionnaire    Fear of Current or Ex-Partner: No    Emotionally Abused: No    Physically Abused: Not on file    Sexually Abused: Not on file    Allergies  Allergen Reactions   Bystolic [Nebivolol Hcl]      Unknown reaction    Lipitor [Atorvastatin] Other (See Comments)    Muscle aches with statins   Lisinopril     Unknown reaction    Pravastatin     Muscle aches   Red Yeast Rice [Cholestin]     Muscle pain   Sulfonamide Derivatives Hives   Zocor [Simvastatin]     Muscle aches    Current Outpatient Medications  Medication Sig Dispense Refill   ALPRAZolam  (XANAX ) 0.5 MG tablet Take 1 tablet (0.5 mg total) by mouth at bedtime as needed. 30 tablet 1   amLODipine  (NORVASC ) 5 MG tablet Take 1 tablet (5 mg total) by mouth daily. 90 tablet 3   aspirin  81 MG tablet Take 81 mg by mouth daily.     clopidogrel  (PLAVIX ) 75 MG tablet Take 1 tablet (75 mg total) by mouth daily. 30 tablet 6   ezetimibe  (ZETIA ) 10 MG tablet Take 1 tablet (10 mg total) by mouth daily. 90 tablet 3   fenofibrate  160 MG tablet TAKE 1 TABLET(160 MG) BY MOUTH DAILY 90 tablet 3   fexofenadine (ALLEGRA) 180 MG tablet Take 180 mg by mouth daily as needed for allergies or rhinitis.     metoprolol  tartrate (LOPRESSOR ) 25 MG tablet 1/2 tablet twice a day 90 tablet 3   omeprazole  (PRILOSEC) 20 MG capsule Take 20 mg by mouth every 3 (three) days.     oxyCODONE -acetaminophen  (PERCOCET/ROXICET) 5-325 MG tablet Take 1 tablet by mouth every 6 (six) hours as needed for moderate pain (pain score 4-6). 10 tablet 0   Pitavastatin  Magnesium  (ZYPITAMAG ) 4 MG TABS TAKE 1 TABLET BY MOUTH EVERY DAY 90 tablet 1   VENTOLIN  HFA 108 (90 Base) MCG/ACT inhaler INHALE 2 PUFFS INTO THE LUNGS EVERY 6 HOURS AS NEEDED FOR WHEEZING OR SHORTNESS OF BREATH 18 g 0   No current facility-administered medications for this visit.   Facility-Administered Medications Ordered in Other Visits  Medication Dose Route Frequency Provider Last Rate Last Admin   influenza  inactive virus vaccine (FLUZONE/FLUARIX) injection 0.5 mL  0.5 mL Intramuscular Once Lalonde, Matthew C, MD        PHYSICAL EXAM There were no vitals filed for this visit.    Well appearing elderly  man Normal gait and station No extremity weakness  PERTINENT LABORATORY AND RADIOLOGIC DATA  Most recent CBC  Latest Ref Rng & Units 08/24/2023    4:20 AM 08/13/2023    2:00 PM 12/23/2022   10:19 AM  CBC  WBC 4.0 - 10.5 K/uL 13.7  6.4  7.3   Hemoglobin 13.0 - 17.0 g/dL 16.1  09.6  04.5   Hematocrit 39.0 - 52.0 % 36.3  44.3  46.2   Platelets 150 - 400 K/uL 350  336  295      Most recent CMP    Latest Ref Rng & Units 08/24/2023    4:20 AM 08/13/2023    2:00 PM 12/23/2022   10:19 AM  CMP  Glucose 70 - 99 mg/dL 409  94  99   BUN 8 - 23 mg/dL 14  13  17    Creatinine 0.61 - 1.24 mg/dL 8.11  9.14  7.82   Sodium 135 - 145 mmol/L 138  144  143   Potassium 3.5 - 5.1 mmol/L 4.1  4.4  4.7   Chloride 98 - 111 mmol/L 109  108  106   CO2 22 - 32 mmol/L 20  26  20    Calcium  8.9 - 10.3 mg/dL 8.8  9.7  95.6   Total Protein 6.5 - 8.1 g/dL  7.8  7.3   Total Bilirubin 0.0 - 1.2 mg/dL  0.6  0.3   Alkaline Phos 38 - 126 U/L  48  64   AST 15 - 41 U/L  31  25   ALT 0 - 44 U/L  20  13     Renal function CrCl cannot be calculated (Patient's most recent lab result is older than the maximum 21 days allowed.).  No results found for: "HGBA1C"  LDL Cholesterol (Calc)  Date Value Ref Range Status  03/30/2017 130 (H) mg/dL (calc) Final    Comment:    Reference range: <100 . Desirable range <100 mg/dL for primary prevention;   <70 mg/dL for patients with CHD or diabetic patients  with > or = 2 CHD risk factors. Aaron Aas LDL-C is now calculated using the Martin-Hopkins  calculation, which is a validated novel method providing  better accuracy than the Friedewald equation in the  estimation of LDL-C.  Melinda Sprawls et al. Erroll Heard. 2130;865(78): 2061-2068  (http://education.[facility].com/faq/FAQ164)    LDL Chol Calc (NIH)  Date Value Ref Range Status  12/23/2022 79 0 - 99 mg/dL Final   Direct LDL  Date Value Ref Range Status  05/04/2016 67 <130 mg/dL Final    Comment:        Desirable range <100  mg/dL for patients with CHD or diabetes and <70 mg/dL for diabetic patients with known heart disease.      LDL Direct  Date Value Ref Range Status  03/07/2019 100 (H) 0 - 99 mg/dL Final    Right Carotid: Velocities in the right ICA are consistent with a 60-79%                 stenosis.   Left Carotid: Velocities in the left ICA are consistent with a 80-99%  stenosis.   Vertebrals: Bilateral vertebral arteries demonstrate antegrade flow.  Subclavians: Normal flow hemodynamics were seen in bilateral subclavian               arteries.   CT angiogram of head and neck:  Severe bilateral internal carotid artery stenosis (>80%). The right side may be amenable to stenting.  The left side appears more favorable for stenting.  He has highly tortuous internal carotid arteries bilaterally.  Heber Little. Edgardo Goodwill, MD FACS [facility] Phone Number: [phone] 10/11/2023 7:14 PM   Total time spent on preparing this encounter including chart review, data review, collecting history, examining the patient, coordinating care for this established patient, 30 minutes  Portions of this report may have been transcribed using voice recognition software.  Every effort has been made to ensure accuracy; however, inadvertent computerized transcription errors may still be present.

## 2023-10-12 ENCOUNTER — Ambulatory Visit (HOSPITAL_COMMUNITY)
Admission: RE | Admit: 2023-10-12 | Discharge: 2023-10-12 | Disposition: A | Discharge status: Home / Self Care | Source: Ambulatory Visit | Attending: Vascular Surgery | Admitting: Vascular Surgery

## 2023-10-12 ENCOUNTER — Ambulatory Visit (INDEPENDENT_AMBULATORY_CARE_PROVIDER_SITE_OTHER): Admitting: Vascular Surgery

## 2023-10-12 ENCOUNTER — Encounter: Payer: Self-pay | Admitting: Vascular Surgery

## 2023-10-12 VITALS — BP 157/69 | HR 59 | Temp 98.0°F | Ht 67.0 in | Wt 160.9 lb

## 2023-10-12 DIAGNOSIS — I6523 Occlusion and stenosis of bilateral carotid arteries: Secondary | ICD-10-CM

## 2023-10-13 ENCOUNTER — Other Ambulatory Visit: Payer: Self-pay

## 2023-10-13 DIAGNOSIS — I6523 Occlusion and stenosis of bilateral carotid arteries: Secondary | ICD-10-CM

## 2023-10-15 NOTE — Progress Notes (Signed)
 Surgical Instructions   Your procedure is scheduled on Wednesday, June 4th. Report to [facility] Main Entrance "A" at 6:30 A.M., then check in with the Admitting office. Any questions or running late day of surgery: call [phone]  Questions prior to your surgery date: call [phone], Monday-Friday, 8am-4pm. If you experience any cold or flu symptoms such as cough, fever, chills, shortness of breath, etc. between now and your scheduled surgery, please notify us  at the above number.     Remember:  Do not eat or drink after midnight the night before your surgery   Take these medicines the morning of surgery with A SIP OF WATER  amLODipine  (NORVASC )  Aspirin   clopidogrel  (PLAVIX )   ezetimibe  (ZETIA )  fenofibrate   metoprolol  tartrate (LOPRESSOR )  omeprazole  (PRILOSEC)  Pitavastatin  Magnesium  (ZYPITAMAG )   May take these medicines IF NEEDED: fexofenadine (ALLEGRA)  oxyCODONE -acetaminophen  (PERCOCET/ROXICET)  VENTOLIN  HFA INHALER   One week prior to surgery, STOP taking any Aleve, Naproxen, Ibuprofen, Motrin, Advil, Goody's, BC's, all herbal medications, fish oil, and non-prescription vitamins.                     Do NOT Smoke (Tobacco/Vaping) for 24 hours prior to your procedure.  If you use a CPAP at night, you may bring your mask/headgear for your overnight stay.   You will be asked to remove any contacts, glasses, piercing's, hearing aid's, dentures/partials prior to surgery. Please bring cases for these items if needed.    Patients discharged the day of surgery will not be allowed to drive home, and someone needs to stay with them for 24 hours.  SURGICAL WAITING ROOM VISITATION Patients may have no more than 2 support people in the waiting area - these visitors may rotate.   Pre-op nurse will coordinate an appropriate time for 1 ADULT support person, who may not rotate, to accompany patient in pre-op.  Children under the age of 63 must have an adult with them who is  not the patient and must remain in the main waiting area with an adult.  If the patient needs to stay at the hospital during part of their recovery, the visitor guidelines for inpatient rooms apply.  Please refer to the [facility] website for the visitor guidelines for any additional information.   If you received a COVID test during your pre-op visit  it is requested that you wear a mask when out in public, stay away from anyone that may not be feeling well and notify your surgeon if you develop symptoms. If you have been in contact with anyone that has tested positive in the last 10 days please notify you surgeon.      Pre-operative CHG Bathing Instructions   You can play a key role in reducing the risk of infection after surgery. Your skin needs to be as free of germs as possible. You can reduce the number of germs on your skin by washing with CHG (chlorhexidine  gluconate) soap before surgery. CHG is an antiseptic soap that kills germs and continues to kill germs even after washing.   DO NOT use if you have an allergy to chlorhexidine /CHG or antibacterial soaps. If your skin becomes reddened or irritated, stop using the CHG and notify one of our RNs at [phone].              TAKE A SHOWER THE NIGHT BEFORE SURGERY AND THE DAY OF SURGERY    Please keep in mind the following:  DO NOT shave,  including legs and underarms, 48 hours prior to surgery.   You may shave your face before/day of surgery.  Place clean sheets on your bed the night before surgery Use a clean washcloth (not used since being washed) for each shower. DO NOT sleep with pet's night before surgery.  CHG Shower Instructions:  Wash your face and private area with normal soap. If you choose to wash your hair, wash first with your normal shampoo.  After you use shampoo/soap, rinse your hair and body thoroughly to remove shampoo/soap residue.  Turn the water OFF and apply half the bottle of CHG soap to a CLEAN washcloth.   Apply CHG soap ONLY FROM YOUR NECK DOWN TO YOUR TOES (washing for 3-5 minutes)  DO NOT use CHG soap on face, private areas, open wounds, or sores.  Pay special attention to the area where your surgery is being performed.  If you are having back surgery, having someone wash your back for you may be helpful. Wait 2 minutes after CHG soap is applied, then you may rinse off the CHG soap.  Pat dry with a clean towel  Put on clean pajamas    Additional instructions for the day of surgery: DO NOT APPLY any lotions, deodorants, cologne, or perfumes.   Do not wear jewelry or makeup Do not wear nail polish, gel polish, artificial nails, or any other type of covering on natural nails (fingers and toes) Do not bring valuables to the hospital. [facility] is not responsible for valuables/personal belongings. Put on clean/comfortable clothes.  Please brush your teeth.  Ask your nurse before applying any prescription medications to the skin.

## 2023-10-18 ENCOUNTER — Encounter (HOSPITAL_COMMUNITY)
Admission: RE | Admit: 2023-10-18 | Discharge: 2023-10-18 | Disposition: A | Discharge status: Home / Self Care | Source: Ambulatory Visit | Attending: Vascular Surgery | Admitting: Vascular Surgery

## 2023-10-18 ENCOUNTER — Other Ambulatory Visit: Payer: Self-pay

## 2023-10-18 ENCOUNTER — Encounter (HOSPITAL_COMMUNITY): Payer: Self-pay

## 2023-10-18 VITALS — BP 176/73 | HR 62 | Temp 97.8°F | Resp 18 | Ht 67.0 in | Wt 159.8 lb

## 2023-10-18 DIAGNOSIS — Z01812 Encounter for preprocedural laboratory examination: Secondary | ICD-10-CM | POA: Insufficient documentation

## 2023-10-18 DIAGNOSIS — E785 Hyperlipidemia, unspecified: Secondary | ICD-10-CM | POA: Insufficient documentation

## 2023-10-18 DIAGNOSIS — Z85818 Personal history of malignant neoplasm of other sites of lip, oral cavity, and pharynx: Secondary | ICD-10-CM | POA: Insufficient documentation

## 2023-10-18 DIAGNOSIS — I2510 Atherosclerotic heart disease of native coronary artery without angina pectoris: Secondary | ICD-10-CM | POA: Insufficient documentation

## 2023-10-18 DIAGNOSIS — Z7902 Long term (current) use of antithrombotics/antiplatelets: Secondary | ICD-10-CM | POA: Insufficient documentation

## 2023-10-18 DIAGNOSIS — J449 Chronic obstructive pulmonary disease, unspecified: Secondary | ICD-10-CM | POA: Insufficient documentation

## 2023-10-18 DIAGNOSIS — K219 Gastro-esophageal reflux disease without esophagitis: Secondary | ICD-10-CM | POA: Insufficient documentation

## 2023-10-18 DIAGNOSIS — I10 Essential (primary) hypertension: Secondary | ICD-10-CM | POA: Insufficient documentation

## 2023-10-18 DIAGNOSIS — Z01818 Encounter for other preprocedural examination: Secondary | ICD-10-CM

## 2023-10-18 DIAGNOSIS — I6523 Occlusion and stenosis of bilateral carotid arteries: Secondary | ICD-10-CM | POA: Insufficient documentation

## 2023-10-18 HISTORY — DX: Disorder of arteries and arterioles, unspecified: I77.9

## 2023-10-18 LAB — URINALYSIS, ROUTINE W REFLEX MICROSCOPIC
Bilirubin Urine: NEGATIVE
Glucose, UA: NEGATIVE mg/dL
Hgb urine dipstick: NEGATIVE
Ketones, ur: NEGATIVE mg/dL
Leukocytes,Ua: NEGATIVE
Nitrite: NEGATIVE
Protein, ur: NEGATIVE mg/dL
Specific Gravity, Urine: 1.005 (ref 1.005–1.030)
pH: 6 (ref 5.0–8.0)

## 2023-10-18 LAB — COMPREHENSIVE METABOLIC PANEL WITH GFR
ALT: 17 U/L (ref 0–44)
AST: 25 U/L (ref 15–41)
Albumin: 4 g/dL (ref 3.5–5.0)
Alkaline Phosphatase: 50 U/L (ref 38–126)
Anion gap: 9 (ref 5–15)
BUN: 11 mg/dL (ref 8–23)
CO2: 26 mmol/L (ref 22–32)
Calcium: 9.5 mg/dL (ref 8.9–10.3)
Chloride: 104 mmol/L (ref 98–111)
Creatinine, Ser: 1.14 mg/dL (ref 0.61–1.24)
GFR, Estimated: >60 mL/min (ref >60)
Glucose, Bld: 103 mg/dL — ABNORMAL HIGH (ref 70–99)
Potassium: 4.2 mmol/L (ref 3.5–5.1)
Sodium: 139 mmol/L (ref 135–145)
Total Bilirubin: 0.8 mg/dL (ref 0.0–1.2)
Total Protein: 7.7 g/dL (ref 6.5–8.1)

## 2023-10-18 LAB — CBC
HCT: 45.1 % (ref 39.0–52.0)
Hemoglobin: 15.1 g/dL (ref 13.0–17.0)
MCH: 31.4 pg (ref 26.0–34.0)
MCHC: 33.5 g/dL (ref 30.0–36.0)
MCV: 93.8 fL (ref 80.0–100.0)
Platelets: 320 10*3/uL (ref 150–400)
RBC: 4.81 MIL/uL (ref 4.22–5.81)
RDW: 12.9 % (ref 11.5–15.5)
WBC: 6.6 10*3/uL (ref 4.0–10.5)
nRBC: 0 % (ref 0.0–0.2)

## 2023-10-18 LAB — TYPE AND SCREEN
ABO/RH(D): O NEG
Antibody Screen: NEGATIVE

## 2023-10-18 LAB — PROTIME-INR
INR: 1 (ref 0.8–1.2)
Prothrombin Time: 13.3 s (ref 11.4–15.2)

## 2023-10-18 LAB — SURGICAL PCR SCREEN
MRSA, PCR: POSITIVE — AB
Staphylococcus aureus: POSITIVE — AB

## 2023-10-18 LAB — APTT: aPTT: 29 s (ref 24–36)

## 2023-10-18 NOTE — Progress Notes (Addendum)
 PCP - Watson Hacking, MD  Cardiologist - Maximo Spar Aviva Lemmings, MD   PPM/ICD - denies Device Orders - n/a Rep Notified - n/a  Chest x-ray -  EKG - 07-28-23 Stress Test - 01-16-16 ECHO - 01-23-16 Cardiac Cath - 06-19-08  Sleep Study - denies CPAP - n/a  DM -denies  Blood Thinner Instructions: Aspirin  Instructions:  ERAS Protcol - NPO   COVID TEST-    Anesthesia review: yes HTN, CAD< COPD  Patient denies shortness of breath, fever, cough and chest pain at PAT appointment   All instructions explained to the patient, with a verbal understanding of the material. Patient agrees to go over the instructions while at home for a better understanding. Patient also instructed to self quarantine after being tested for COVID-19. The opportunity to ask questions was provided.

## 2023-10-19 ENCOUNTER — Encounter (HOSPITAL_COMMUNITY): Payer: Self-pay

## 2023-10-19 NOTE — Progress Notes (Signed)
 Anesthesia Chart Review: Matthew Moreno  Case: [id number] Date/Time: 10/20/23 0815   Procedure: TRANSCAROTID ARTERY REVASCULARIZATION (TCAR) (Right)   Anesthesia type: General   Diagnosis: Bilateral carotid artery stenosis [I65.23]   Pre-op diagnosis: Bilateral carotid stenosis   [facility]: [facility] ROOM 16 / [facility]   Surgeons: Matthew Che, MD       DISCUSSION: Patient is a 78 year old male scheduled for the above procedure. Recent carotid imaging revealed progressive, significant bilateral ICA stenosis, asymptomatic. S/p left TCAR on 08/23/23. He now presents for right TCAR.   History includes includes former smoker (quit 11/02/83), COPD, HTN, HLD (statin intolerance), carotid artery stenosis, CAD, left tonsillar SCC cancer (s/p radiation 2015), carotid artery stenosis (left TCAR 08/23/23), dysphagia, GERD, skin cancer (BCC), panic attacks.    He has a "DIFFICULT AIRWAY" flag in his chart. On 09/04/18 he presented to the ED with meat impaction in his esophagus. He required EGD per GI to extract. ED provider intubated patient prior to the procedure as there was concern about difficult intubation given prior neck radiation with acute food impaction. Per documentation: "Difficult airway due to:: Difficulty was anticipated  Placed By: ED Physician  Airway Device: Endotracheal Tube  Laryngoscope Blade: MAC;4  ETT Types: Oral  Size (mm): 7.5 mm  Cuffed: Cuffed  Insertion attempts: 1  Airway Equipment: Stylet  Placement Confirmation.."  Grade 1 view, mask ventilation without difficultly, MAC and 4 were used to place 7.5 mm ETT, one attempt on 08/23/23.    Last cardiology visit with Dr. Maximo Moreno was on 07/28/23 for follow-up moderate CAD (2010 LHC), HTN, dyslipidemia, DOE, carotid artery stenosis. He noted patient had pending CTA and may be considered for left carotid intervention. He thought patient would be at "acceptable risk to proceed with endarterectomy or stenting if necessary." One year follow-up recommended.     He is to continue ASA and Plavix  for procedure. Anesthesia team to evaluate on the day of surgery.     VS: BP (!) 176/73   Pulse 62   Temp 36.6 C   Resp 18   Ht 5\' 7"  (1.702 m)   Wt 72.5 kg   SpO2 100%   BMI 25.03 kg/m   PROVIDERS: Matthew Hacking, MD is PCP Matthew Franco, MD is cardiologist Matthew Dawes, MD is [facility]   LABS: Labs reviewed: Acceptable for surgery. (all labs ordered are listed, but only abnormal results are displayed)  Labs Reviewed  SURGICAL PCR SCREEN - Abnormal; Notable for the following components:      Result Value   MRSA, PCR POSITIVE (*)    Staphylococcus aureus POSITIVE (*)    All other components within normal limits  COMPREHENSIVE METABOLIC PANEL WITH GFR - Abnormal; Notable for the following components:   Glucose, Bld 103 (*)    All other components within normal limits  URINALYSIS, ROUTINE W REFLEX MICROSCOPIC - Abnormal; Notable for the following components:   Color, Urine STRAW (*)    All other components within normal limits  CBC  PROTIME-INR  APTT  TYPE AND SCREEN     IMAGES: CTA Neck 08/06/23 (pre left TCAR): Report in process.     EKG: 07/28/23: NSR     CV: US  Carotid 10/12/23:: Summary:  - Summary:  - Right Carotid: Velocities in the right ICA are consistent with a 80-99% stenosis. The ECA appears >50% stenosed.  - Left Carotid: Patent stent with no evidence of significant restenosis.  - Vertebrals:  Bilateral vertebral arteries demonstrate  antegrade flow.  - Subclavians: Normal flow hemodynamics were seen in bilateral subclavian arteries.     Echo 01/23/16: Study Conclusions  - Left ventricle: The cavity size was normal. Wall thickness was    normal. Systolic function was normal. The estimated ejection    fraction was in the range of 60% to 65%. Wall motion was normal;    there were no regional wall motion abnormalities. Doppler    parameters are consistent with abnormal left ventricular    relaxation (grade 1  diastolic dysfunction).  - Aortic valve: There was no stenosis. There was mild    regurgitation.  - Mitral valve: There was no significant regurgitation.  - Right ventricle: The cavity size was normal. Systolic function    was normal.  - Tricuspid valve: Peak RV-RA gradient (S): 24 mm Hg.  - Pulmonary arteries: PA peak pressure: 27 mm Hg (S).  - Inferior vena cava: The vessel was normal in size. The    respirophasic diameter changes were in the normal range (>= 50%),    consistent with normal central venous pressure.  Impressions:  - Normal LV size with EF 60-65%. Normal RV size and systolic    function. Mild aortic insufficiency.      Nuclear stress test 01/16/16: The left ventricular ejection fraction is mildly decreased (45-54%). Nuclear stress EF: 48%. Blood pressure demonstrated a hypertensive response to exercise. There was no ST segment deviation noted during stress. The study is normal. This is a low risk study.  Normal exercise nuclear stress test with no evidence of prior scar or ischemia. LVEF mildly decreased but visually appears better than calculated. A correlation with an echocardiogram is recommended. Baseline hypertension with hypertensive response to exercise (215/72 mmHg).     Cardiac cath 06/19/08: CORONARY ARTERIOGRAPHY:  On fluoroscopy, there was moderate-to-severe calcification in the proximal and mid LAD. 1. The left main normal and bifurcated. 2. Circumflex.  The circumflex gave rise to three OM vessels.  There was 50% ostial narrowing of the OM #1 (this will need to be managed medically.) The remainder of the circumflex system was free of disease. 3. LAD.  The LAD crossed the apex of the heart. It was densely calcified to at least the proximal half of the vessel.  There was mild eccentric narrowing in the midportion of the LAD of about 40-50% severity.  The distal vessel was free of disease.  The first and second diagonals were also free of disease. 4. Right  coronary artery.  This was a tortuous vessel with less than 30% narrowing in its midportion.  The distal right coronary artery, the posterior descending artery and the posterolateral vessel were all free of disease. - Matthew Moreno will require medical management and will probably need a stress test in about a year.   Past Medical History:  Diagnosis Date   Arthritis    bilateral hands   Basal cell carcinoma of scalp 01/14/2022   CAD (coronary artery disease) 2008   Carotid artery disease (HCC)    COPD (chronic obstructive pulmonary disease) (HCC)    Dyslipidemia    Dysphagia    Family history of heart disease    GERD (gastroesophageal reflux disease)    History of kidney stones    Hypertension    Panic attacks    Peripheral vascular disease (HCC)    Carotid Stenosis   S/P radiation therapy 02/12/2014-04/02/2014   Left tonsil and bilateral neck / 70 Gy in 35 fractions to gross disease, 63 Gy  in 35 fractions to high risk nodalechelons, and 56 Gy in 35 fractions to intermediate risk nodal echelons    Statin intolerance    Tonsil cancer (HCC) 01/05/2014   left    Past Surgical History:  Procedure Laterality Date   BICEPS TENDON REPAIR Left    CARDIAC CATHETERIZATION  06/19/2008   normal left main, Cfx with 3 OMs (50% osital narrowing in OM1), LAD densely calcified in the proximal half of vessel with eccentric narrowing in mid region (40-50% severity), diag 1 & 2 free of disease, RCA with less than 30% narrowing in midportion (Dr. Rande Bushy)   COLONOSCOPY  2010   Dr. Howard Macho   ESOPHAGOGASTRODUODENOSCOPY N/A 09/04/2018   Procedure: ESOPHAGOGASTRODUODENOSCOPY (EGD);  Surgeon: Alvis Jourdain, MD;  Location: [facility] ENDOSCOPY;  Service: Endoscopy;  Laterality: N/A;   EYE SURGERY Bilateral    Cataract   FOREIGN BODY REMOVAL  09/04/2018   Procedure: FOREIGN BODY REMOVAL;  Surgeon: Alvis Jourdain, MD;  Location: [facility] ENDOSCOPY;  Service: Endoscopy;;   MULTIPLE TOOTH EXTRACTIONS     All top teeth and  most of bottom teeth   SKIN BIOPSY Right 12/15/2021   basal cell carcinoma of the scalp   TRANSCAROTID ARTERY REVASCULARIZATION  Left 08/23/2023   Procedure: LEFT TRANSCAROTID ARTERY REVASCULARIZATION (TCAR);  Surgeon: Matthew Che, MD;  Location: [facility] OR;  Service: Vascular;  Laterality: Left;   ULTRASOUND GUIDANCE FOR VASCULAR ACCESS Right 08/23/2023   Procedure: ULTRASOUND GUIDANCE, FOR VASCULAR ACCESS, RIGHT FEMORAL VEIN;  Surgeon: Matthew Che, MD;  Location: [facility] OR;  Service: Vascular;  Laterality: Right;    MEDICATIONS:  ALPRAZolam  (XANAX ) 0.5 MG tablet   amLODipine  (NORVASC ) 5 MG tablet   aspirin  81 MG tablet   clopidogrel  (PLAVIX ) 75 MG tablet   ezetimibe  (ZETIA ) 10 MG tablet   fenofibrate  160 MG tablet   fexofenadine (ALLEGRA) 180 MG tablet   metoprolol  tartrate (LOPRESSOR ) 25 MG tablet   omeprazole  (PRILOSEC) 20 MG capsule   oxyCODONE -acetaminophen  (PERCOCET/ROXICET) 5-325 MG tablet   Pitavastatin  Magnesium  (ZYPITAMAG ) 4 MG TABS   VENTOLIN  HFA 108 (90 Base) MCG/ACT inhaler   No current facility-administered medications for this encounter.    influenza  inactive virus vaccine (FLUZONE/FLUARIX) injection 0.5 mL    Lotta Frankenfield, PA-C Surgical Short Stay/Anesthesiology [facility] Phone [phone] [facility] Phone [phone] 10/19/2023 10:10 AM

## 2023-10-19 NOTE — Progress Notes (Signed)
 MD office aware of +MRSA and MSSA result

## 2023-10-19 NOTE — Anesthesia Preprocedure Evaluation (Signed)
 Anesthesia Evaluation  Patient identified by MRN, date of birth, ID band Patient awake    Reviewed: Allergy & Precautions, NPO status , Patient's Chart, lab work & pertinent test results  Airway Mallampati: II  TM Distance: >3 FB Neck ROM: Full    Dental no notable dental hx. (+) Poor Dentition, Missing   Pulmonary COPD, former smoker   Pulmonary exam normal        Cardiovascular hypertension, Pt. on medications and Pt. on home beta blockers + CAD and + Peripheral Vascular Disease   Rhythm:Regular Rate:Normal  CV: US  Carotid 10/12/23:: Summary:  - Summary:  - Right Carotid: Velocities in the right ICA are consistent with a 80-99% stenosis. The ECA appears >50% stenosed.  - Left Carotid: Patent stent with no evidence of significant restenosis.  - Vertebrals:  Bilateral vertebral arteries demonstrate antegrade flow.  - Subclavians: Normal flow hemodynamics were seen in bilateral subclavian arteries.     Echo 01/23/16: Study Conclusions  - Left ventricle: The cavity size was normal. Wall thickness was    normal. Systolic function was normal. The estimated ejection    fraction was in the range of 60% to 65%. Wall motion was normal;    there were no regional wall motion abnormalities. Doppler    parameters are consistent with abnormal left ventricular    relaxation (grade 1 diastolic dysfunction).  - Aortic valve: There was no stenosis. There was mild    regurgitation.  - Mitral valve: There was no significant regurgitation.  - Right ventricle: The cavity size was normal. Systolic function    was normal.  - Tricuspid valve: Peak RV-RA gradient (S): 24 mm Hg.  - Pulmonary arteries: PA peak pressure: 27 mm Hg (S).  - Inferior vena cava: The vessel was normal in size. The    respirophasic diameter changes were in the normal range (>= 50%),    consistent with normal central venous pressure.  Impressions:  - Normal LV size with EF  60-65%. Normal RV size and systolic    function. Mild aortic insufficiency    Neuro/Psych   Anxiety     negative neurological ROS     GI/Hepatic Neg liver ROS,GERD  Medicated,,  Endo/Other  negative endocrine ROS    Renal/GU   negative genitourinary   Musculoskeletal  (+) Arthritis , Osteoarthritis,    Abdominal Normal abdominal exam  (+)   Peds  Hematology Lab Results      Component                Value               Date                      WBC                      6.6                 10/18/2023                HGB                      15.1                10/18/2023                HCT  45.1                10/18/2023                MCV                      93.8                10/18/2023                PLT                      320                 10/18/2023             Lab Results      Component                Value               Date                      NA                       139                 10/18/2023                K                        4.2                 10/18/2023                CO2                      26                  10/18/2023                GLUCOSE                  103 (H)             10/18/2023                BUN                      11                  10/18/2023                CREATININE               1.14                10/18/2023                CALCIUM                   9.5                 10/18/2023                EGFR                     56 (L)  12/23/2022                GFRNONAA                 >60                 10/18/2023              Anesthesia Other Findings   Reproductive/Obstetrics                             Anesthesia Physical Anesthesia Plan  ASA: 3  Anesthesia Plan: General   Post-op Pain Management:    Induction: Intravenous  PONV Risk Score and Plan: 2 and Ondansetron , Dexamethasone  and Treatment may vary due to age or medical condition  Airway Management  Planned: Mask and Oral ETT  Additional Equipment: Arterial line  Intra-op Plan:   Post-operative Plan: Extubation in OR  Informed Consent: I have reviewed the patients History and Physical, chart, labs and discussed the procedure including the risks, benefits and alternatives for the proposed anesthesia with the patient or authorized representative who has indicated his/her understanding and acceptance.     Dental advisory given  Plan Discussed with: CRNA  Anesthesia Plan Comments: (PAT note written 10/19/2023 by Bonham Zingale, PA-C.  )       Anesthesia Quick Evaluation

## 2023-10-20 ENCOUNTER — Inpatient Hospital Stay (HOSPITAL_COMMUNITY)
Admission: RE | Admit: 2023-10-20 | Discharge: 2023-10-21 | DRG: 036 | Disposition: A | Discharge status: Home / Self Care | Attending: Vascular Surgery | Admitting: Vascular Surgery

## 2023-10-20 ENCOUNTER — Encounter (HOSPITAL_COMMUNITY)
Admission: RE | Disposition: A | Discharge status: Home / Self Care | Payer: Self-pay | Source: Home / Self Care | Attending: Vascular Surgery

## 2023-10-20 ENCOUNTER — Encounter (HOSPITAL_COMMUNITY): Payer: Self-pay | Admitting: Vascular Surgery

## 2023-10-20 ENCOUNTER — Inpatient Hospital Stay (HOSPITAL_COMMUNITY): Payer: Self-pay

## 2023-10-20 ENCOUNTER — Other Ambulatory Visit: Payer: Self-pay

## 2023-10-20 ENCOUNTER — Inpatient Hospital Stay (HOSPITAL_COMMUNITY)

## 2023-10-20 ENCOUNTER — Inpatient Hospital Stay (HOSPITAL_COMMUNITY): Payer: Self-pay | Admitting: Physician Assistant

## 2023-10-20 DIAGNOSIS — Z923 Personal history of irradiation: Secondary | ICD-10-CM

## 2023-10-20 DIAGNOSIS — M19041 Primary osteoarthritis, right hand: Secondary | ICD-10-CM | POA: Diagnosis present

## 2023-10-20 DIAGNOSIS — Z85818 Personal history of malignant neoplasm of other sites of lip, oral cavity, and pharynx: Secondary | ICD-10-CM | POA: Diagnosis not present

## 2023-10-20 DIAGNOSIS — I739 Peripheral vascular disease, unspecified: Secondary | ICD-10-CM | POA: Diagnosis present

## 2023-10-20 DIAGNOSIS — I6521 Occlusion and stenosis of right carotid artery: Principal | ICD-10-CM | POA: Diagnosis present

## 2023-10-20 DIAGNOSIS — Z79899 Other long term (current) drug therapy: Secondary | ICD-10-CM

## 2023-10-20 DIAGNOSIS — E785 Hyperlipidemia, unspecified: Secondary | ICD-10-CM | POA: Diagnosis present

## 2023-10-20 DIAGNOSIS — I10 Essential (primary) hypertension: Secondary | ICD-10-CM

## 2023-10-20 DIAGNOSIS — Z7982 Long term (current) use of aspirin: Secondary | ICD-10-CM | POA: Diagnosis not present

## 2023-10-20 DIAGNOSIS — Z7902 Long term (current) use of antithrombotics/antiplatelets: Secondary | ICD-10-CM

## 2023-10-20 DIAGNOSIS — Z87891 Personal history of nicotine dependence: Secondary | ICD-10-CM

## 2023-10-20 DIAGNOSIS — K219 Gastro-esophageal reflux disease without esophagitis: Secondary | ICD-10-CM | POA: Diagnosis present

## 2023-10-20 DIAGNOSIS — Z85828 Personal history of other malignant neoplasm of skin: Secondary | ICD-10-CM

## 2023-10-20 DIAGNOSIS — Z882 Allergy status to sulfonamides status: Secondary | ICD-10-CM

## 2023-10-20 DIAGNOSIS — Z888 Allergy status to other drugs, medicaments and biological substances status: Secondary | ICD-10-CM | POA: Diagnosis not present

## 2023-10-20 DIAGNOSIS — Z8249 Family history of ischemic heart disease and other diseases of the circulatory system: Secondary | ICD-10-CM | POA: Diagnosis not present

## 2023-10-20 DIAGNOSIS — Z006 Encounter for examination for normal comparison and control in clinical research program: Secondary | ICD-10-CM | POA: Diagnosis not present

## 2023-10-20 DIAGNOSIS — Z9889 Other specified postprocedural states: Principal | ICD-10-CM

## 2023-10-20 DIAGNOSIS — J449 Chronic obstructive pulmonary disease, unspecified: Secondary | ICD-10-CM | POA: Diagnosis present

## 2023-10-20 DIAGNOSIS — I2510 Atherosclerotic heart disease of native coronary artery without angina pectoris: Secondary | ICD-10-CM

## 2023-10-20 DIAGNOSIS — I6523 Occlusion and stenosis of bilateral carotid arteries: Secondary | ICD-10-CM | POA: Diagnosis not present

## 2023-10-20 DIAGNOSIS — M19042 Primary osteoarthritis, left hand: Secondary | ICD-10-CM | POA: Diagnosis present

## 2023-10-20 HISTORY — PX: ULTRASOUND GUIDANCE FOR VASCULAR ACCESS: SHX6516

## 2023-10-20 HISTORY — PX: TRANSCAROTID ARTERY REVASCULARIZATIONÂ: SHX6778

## 2023-10-20 LAB — CBC
HCT: 42.5 % (ref 39.0–52.0)
Hemoglobin: 14.9 g/dL (ref 13.0–17.0)
MCH: 31.8 pg (ref 26.0–34.0)
MCHC: 35.1 g/dL (ref 30.0–36.0)
MCV: 90.8 fL (ref 80.0–100.0)
Platelets: 307 10*3/uL (ref 150–400)
RBC: 4.68 MIL/uL (ref 4.22–5.81)
RDW: 12.8 % (ref 11.5–15.5)
WBC: 14 10*3/uL — ABNORMAL HIGH (ref 4.0–10.5)
nRBC: 0 % (ref 0.0–0.2)

## 2023-10-20 LAB — ABO/RH: ABO/RH(D): O NEG

## 2023-10-20 LAB — CREATININE, SERUM
Creatinine, Ser: 1.17 mg/dL (ref 0.61–1.24)
GFR, Estimated: >60 mL/min (ref >60)

## 2023-10-20 SURGERY — TRANSCAROTID ARTERY REVASCULARIZATION (TCAR)
Anesthesia: General | Site: Neck | Laterality: Right

## 2023-10-20 MED ORDER — EZETIMIBE 10 MG PO TABS
10.0000 mg | ORAL_TABLET | Freq: Every day | ORAL | Status: DC
  Administered 2023-10-21: 10 mg via ORAL
  Filled 2023-10-20: qty 1

## 2023-10-20 MED ORDER — GLYCOPYRROLATE PF 0.2 MG/ML IJ SOSY
PREFILLED_SYRINGE | INTRAMUSCULAR | Status: DC | PRN
  Administered 2023-10-20 (×2): .1 mg via INTRAVENOUS
  Administered 2023-10-20: .2 mg via INTRAVENOUS

## 2023-10-20 MED ORDER — PROTAMINE SULFATE 10 MG/ML IV SOLN
INTRAVENOUS | Status: DC | PRN
  Administered 2023-10-20: 50 mg via INTRAVENOUS

## 2023-10-20 MED ORDER — SODIUM CHLORIDE 0.9 % IV SOLN: 250.0000 mL | INTRAVENOUS | Status: DC | PRN

## 2023-10-20 MED ORDER — ASPIRIN 81 MG PO TBEC
81.0000 mg | DELAYED_RELEASE_TABLET | Freq: Every day | ORAL | Status: DC
  Administered 2023-10-21: 81 mg via ORAL
  Filled 2023-10-20: qty 1

## 2023-10-20 MED ORDER — CEFAZOLIN SODIUM-DEXTROSE 2-4 GM/100ML-% IV SOLN
2.0000 g | INTRAVENOUS | Status: AC
  Administered 2023-10-20: 2 g via INTRAVENOUS
  Filled 2023-10-20: qty 100

## 2023-10-20 MED ORDER — MIDAZOLAM HCL 2 MG/2ML IJ SOLN
INTRAMUSCULAR | Status: DC | PRN
  Administered 2023-10-20: 1 mg via INTRAVENOUS

## 2023-10-20 MED ORDER — ALUM & MAG HYDROXIDE-SIMETH 200-200-20 MG/5ML PO SUSP: 15.0000 mL | ORAL | Status: DC | PRN

## 2023-10-20 MED ORDER — ORAL CARE MOUTH RINSE: 15.0000 mL | Freq: Once | OROMUCOSAL | Status: AC

## 2023-10-20 MED ORDER — HYDROMORPHONE HCL 1 MG/ML IJ SOLN: 0.5000 mg | INTRAMUSCULAR | Status: DC | PRN

## 2023-10-20 MED ORDER — PHENYLEPHRINE 80 MCG/ML (10ML) SYRINGE FOR IV PUSH (FOR BLOOD PRESSURE SUPPORT)
PREFILLED_SYRINGE | INTRAVENOUS | Status: DC | PRN
  Administered 2023-10-20: 160 ug via INTRAVENOUS

## 2023-10-20 MED ORDER — ONDANSETRON HCL 4 MG/2ML IJ SOLN
INTRAMUSCULAR | Status: DC | PRN
  Administered 2023-10-20: 4 mg via INTRAVENOUS

## 2023-10-20 MED ORDER — ROCURONIUM BROMIDE 10 MG/ML (PF) SYRINGE
PREFILLED_SYRINGE | INTRAVENOUS | Status: DC | PRN
  Administered 2023-10-20: 60 mg via INTRAVENOUS

## 2023-10-20 MED ORDER — HEPARIN SODIUM (PORCINE) 1000 UNIT/ML IJ SOLN
INTRAMUSCULAR | Status: DC | PRN
  Administered 2023-10-20: 8000 [IU] via INTRAVENOUS

## 2023-10-20 MED ORDER — IODIXANOL 320 MG/ML IV SOLN
INTRAVENOUS | Status: DC | PRN
  Administered 2023-10-20: 16 mL via INTRA_ARTERIAL

## 2023-10-20 MED ORDER — SODIUM CHLORIDE 0.9 % IV SOLN: INTRAVENOUS | Status: DC

## 2023-10-20 MED ORDER — SUGAMMADEX SODIUM 200 MG/2ML IV SOLN
INTRAVENOUS | Status: DC | PRN
  Administered 2023-10-20: 200 mg via INTRAVENOUS

## 2023-10-20 MED ORDER — MAGNESIUM SULFATE 2 GM/50ML IV SOLN: 2.0000 g | Freq: Every day | INTRAVENOUS | Status: DC | PRN

## 2023-10-20 MED ORDER — ACETAMINOPHEN 650 MG RE SUPP: 325.0000 mg | RECTAL | Status: DC | PRN

## 2023-10-20 MED ORDER — PANTOPRAZOLE SODIUM 40 MG PO TBEC: 40.0000 mg | DELAYED_RELEASE_TABLET | Freq: Every day | ORAL | Status: DC

## 2023-10-20 MED ORDER — GLYCOPYRROLATE PF 0.2 MG/ML IJ SOSY
PREFILLED_SYRINGE | INTRAMUSCULAR | Status: AC
  Filled 2023-10-20: qty 1

## 2023-10-20 MED ORDER — SODIUM CHLORIDE 0.9% FLUSH
3.0000 mL | Freq: Two times a day (BID) | INTRAVENOUS | Status: DC
  Administered 2023-10-20 – 2023-10-21 (×3): 3 mL via INTRAVENOUS

## 2023-10-20 MED ORDER — DEXAMETHASONE SODIUM PHOSPHATE 10 MG/ML IJ SOLN
INTRAMUSCULAR | Status: DC | PRN
  Administered 2023-10-20: 5 mg via INTRAVENOUS

## 2023-10-20 MED ORDER — LIDOCAINE 2% (20 MG/ML) 5 ML SYRINGE
INTRAMUSCULAR | Status: DC | PRN
  Administered 2023-10-20: 80 mg via INTRAVENOUS

## 2023-10-20 MED ORDER — EPHEDRINE SULFATE-NACL 50-0.9 MG/10ML-% IV SOSY
PREFILLED_SYRINGE | INTRAVENOUS | Status: DC | PRN
  Administered 2023-10-20: 5 mg via INTRAVENOUS
  Administered 2023-10-20: 20 mg via INTRAVENOUS

## 2023-10-20 MED ORDER — CHLORHEXIDINE GLUCONATE CLOTH 2 % EX PADS: 6.0000 | MEDICATED_PAD | Freq: Once | CUTANEOUS | Status: DC

## 2023-10-20 MED ORDER — FENTANYL CITRATE (PF) 250 MCG/5ML IJ SOLN
INTRAMUSCULAR | Status: AC
  Filled 2023-10-20: qty 5

## 2023-10-20 MED ORDER — DEXAMETHASONE SODIUM PHOSPHATE 10 MG/ML IJ SOLN
INTRAMUSCULAR | Status: AC
  Filled 2023-10-20: qty 1

## 2023-10-20 MED ORDER — AMLODIPINE BESYLATE 5 MG PO TABS
5.0000 mg | ORAL_TABLET | Freq: Every day | ORAL | Status: DC
  Administered 2023-10-21: 5 mg via ORAL
  Filled 2023-10-20: qty 1

## 2023-10-20 MED ORDER — VASOPRESSIN 20 UNIT/ML IV SOLN
INTRAVENOUS | Status: AC
  Filled 2023-10-20: qty 1

## 2023-10-20 MED ORDER — PHENYLEPHRINE HCL-NACL 20-0.9 MG/250ML-% IV SOLN
INTRAVENOUS | Status: DC | PRN
  Administered 2023-10-20: 30 ug/min via INTRAVENOUS

## 2023-10-20 MED ORDER — HEPARIN 6000 UNIT IRRIGATION SOLUTION
Status: DC | PRN
  Administered 2023-10-20: 1

## 2023-10-20 MED ORDER — PHENOL 1.4 % MT LIQD
1.0000 | OROMUCOSAL | Status: DC | PRN
Start: 2023-10-20 — End: 2023-10-21

## 2023-10-20 MED ORDER — METOPROLOL TARTRATE 5 MG/5ML IV SOLN: 2.0000 mg | INTRAVENOUS | Status: DC | PRN

## 2023-10-20 MED ORDER — HEPARIN SODIUM (PORCINE) 5000 UNIT/ML IJ SOLN
5000.0000 [IU] | Freq: Three times a day (TID) | INTRAMUSCULAR | Status: DC
  Administered 2023-10-21: 5000 [IU] via SUBCUTANEOUS
  Filled 2023-10-20: qty 1

## 2023-10-20 MED ORDER — PROPOFOL 10 MG/ML IV BOLUS
INTRAVENOUS | Status: DC | PRN
  Administered 2023-10-20: 100 mg via INTRAVENOUS

## 2023-10-20 MED ORDER — MIDAZOLAM HCL 2 MG/2ML IJ SOLN
INTRAMUSCULAR | Status: AC
  Filled 2023-10-20: qty 2

## 2023-10-20 MED ORDER — LABETALOL HCL 5 MG/ML IV SOLN: 10.0000 mg | INTRAVENOUS | Status: DC | PRN

## 2023-10-20 MED ORDER — PHENYLEPHRINE 80 MCG/ML (10ML) SYRINGE FOR IV PUSH (FOR BLOOD PRESSURE SUPPORT)
PREFILLED_SYRINGE | INTRAVENOUS | Status: AC
  Filled 2023-10-20: qty 10

## 2023-10-20 MED ORDER — ONDANSETRON HCL 4 MG/2ML IJ SOLN: 4.0000 mg | Freq: Four times a day (QID) | INTRAMUSCULAR | Status: DC | PRN

## 2023-10-20 MED ORDER — FENTANYL CITRATE (PF) 250 MCG/5ML IJ SOLN
INTRAMUSCULAR | Status: DC | PRN
Start: 2023-10-20 — End: 2023-10-20
  Administered 2023-10-20 (×2): 100 ug via INTRAVENOUS
  Administered 2023-10-20: 50 ug via INTRAVENOUS

## 2023-10-20 MED ORDER — PITAVASTATIN MAGNESIUM 4 MG PO TABS: 1.0000 | ORAL_TABLET | Freq: Every day | ORAL | Status: DC

## 2023-10-20 MED ORDER — ROCURONIUM BROMIDE 10 MG/ML (PF) SYRINGE
PREFILLED_SYRINGE | INTRAVENOUS | Status: AC
  Filled 2023-10-20: qty 10

## 2023-10-20 MED ORDER — HYDROMORPHONE HCL 1 MG/ML IJ SOLN: 0.2500 mg | INTRAMUSCULAR | Status: DC | PRN

## 2023-10-20 MED ORDER — VASOPRESSIN 20 UNIT/ML IV SOLN
INTRAVENOUS | Status: DC | PRN
  Administered 2023-10-20: 1 [IU] via INTRAVENOUS

## 2023-10-20 MED ORDER — 0.9 % SODIUM CHLORIDE (POUR BTL) OPTIME
TOPICAL | Status: DC | PRN
  Administered 2023-10-20: 1000 mL

## 2023-10-20 MED ORDER — PANTOPRAZOLE SODIUM 40 MG PO TBEC
40.0000 mg | DELAYED_RELEASE_TABLET | Freq: Every day | ORAL | Status: DC
  Administered 2023-10-20 – 2023-10-21 (×2): 40 mg via ORAL
  Filled 2023-10-20 (×2): qty 1

## 2023-10-20 MED ORDER — SODIUM CHLORIDE 0.9% FLUSH
3.0000 mL | INTRAVENOUS | Status: DC | PRN
Start: 2023-10-20 — End: 2023-10-21

## 2023-10-20 MED ORDER — TRAMADOL HCL 50 MG PO TABS: 50.0000 mg | ORAL_TABLET | Freq: Four times a day (QID) | ORAL | Status: DC | PRN

## 2023-10-20 MED ORDER — ONDANSETRON HCL 4 MG/2ML IJ SOLN
INTRAMUSCULAR | Status: AC
  Filled 2023-10-20: qty 2

## 2023-10-20 MED ORDER — CLEVIDIPINE BUTYRATE 0.5 MG/ML IV EMUL
INTRAVENOUS | Status: DC | PRN
  Administered 2023-10-20: 2 mg/h via INTRAVENOUS

## 2023-10-20 MED ORDER — LIDOCAINE HCL (PF) 1 % IJ SOLN
INTRAMUSCULAR | Status: AC
  Filled 2023-10-20: qty 30

## 2023-10-20 MED ORDER — CLEVIDIPINE BUTYRATE 0.5 MG/ML IV EMUL
INTRAVENOUS | Status: AC
  Filled 2023-10-20: qty 50

## 2023-10-20 MED ORDER — PHENYLEPHRINE HCL (PRESSORS) 10 MG/ML IV SOLN
INTRAVENOUS | Status: DC | PRN
  Administered 2023-10-20: 8 ug via INTRAVENOUS
  Administered 2023-10-20 (×3): 160 ug via INTRAVENOUS

## 2023-10-20 MED ORDER — CEFAZOLIN SODIUM-DEXTROSE 2-4 GM/100ML-% IV SOLN: 2.0000 g | Freq: Three times a day (TID) | INTRAVENOUS | Status: DC

## 2023-10-20 MED ORDER — SUCCINYLCHOLINE CHLORIDE 200 MG/10ML IV SOSY
PREFILLED_SYRINGE | INTRAVENOUS | Status: AC
  Filled 2023-10-20: qty 10

## 2023-10-20 MED ORDER — PROPOFOL 10 MG/ML IV BOLUS
INTRAVENOUS | Status: AC
  Filled 2023-10-20: qty 20

## 2023-10-20 MED ORDER — GUAIFENESIN-DM 100-10 MG/5ML PO SYRP: 15.0000 mL | ORAL_SOLUTION | ORAL | Status: DC | PRN

## 2023-10-20 MED ORDER — SODIUM CHLORIDE 0.9 % IV SOLN: 500.0000 mL | Freq: Once | INTRAVENOUS | Status: DC | PRN

## 2023-10-20 MED ORDER — SENNOSIDES-DOCUSATE SODIUM 8.6-50 MG PO TABS: 1.0000 | ORAL_TABLET | Freq: Every evening | ORAL | Status: DC | PRN

## 2023-10-20 MED ORDER — HEPARIN 6000 UNIT IRRIGATION SOLUTION
Status: AC
  Filled 2023-10-20: qty 500

## 2023-10-20 MED ORDER — ACETAMINOPHEN 325 MG PO TABS: 325.0000 mg | ORAL_TABLET | ORAL | Status: DC | PRN

## 2023-10-20 MED ORDER — ALPRAZOLAM 0.5 MG PO TABS
0.5000 mg | ORAL_TABLET | Freq: Every evening | ORAL | Status: DC | PRN
  Administered 2023-10-20: 0.5 mg via ORAL
  Filled 2023-10-20: qty 1

## 2023-10-20 MED ORDER — HYDRALAZINE HCL 20 MG/ML IJ SOLN: 5.0000 mg | INTRAMUSCULAR | Status: DC | PRN

## 2023-10-20 MED ORDER — DEXMEDETOMIDINE HCL IN NACL 80 MCG/20ML IV SOLN
INTRAVENOUS | Status: AC
  Filled 2023-10-20: qty 20

## 2023-10-20 MED ORDER — LORATADINE 10 MG PO TABS: 10.0000 mg | ORAL_TABLET | Freq: Every day | ORAL | Status: DC

## 2023-10-20 MED ORDER — DOCUSATE SODIUM 100 MG PO CAPS
100.0000 mg | ORAL_CAPSULE | Freq: Every day | ORAL | Status: DC
  Administered 2023-10-21: 100 mg via ORAL
  Filled 2023-10-20: qty 1

## 2023-10-20 MED ORDER — FENOFIBRATE 160 MG PO TABS
160.0000 mg | ORAL_TABLET | Freq: Every day | ORAL | Status: DC
  Administered 2023-10-21: 160 mg via ORAL
  Filled 2023-10-20: qty 1

## 2023-10-20 MED ORDER — EPHEDRINE 5 MG/ML INJ
INTRAVENOUS | Status: AC
  Filled 2023-10-20: qty 5

## 2023-10-20 MED ORDER — BISACODYL 5 MG PO TBEC: 5.0000 mg | DELAYED_RELEASE_TABLET | Freq: Every day | ORAL | Status: DC | PRN

## 2023-10-20 MED ORDER — CLOPIDOGREL BISULFATE 75 MG PO TABS
75.0000 mg | ORAL_TABLET | Freq: Every day | ORAL | Status: DC
  Administered 2023-10-21: 75 mg via ORAL
  Filled 2023-10-20: qty 1

## 2023-10-20 MED ORDER — POTASSIUM CHLORIDE CRYS ER 20 MEQ PO TBCR: 20.0000 meq | EXTENDED_RELEASE_TABLET | Freq: Every day | ORAL | Status: DC | PRN

## 2023-10-20 MED ORDER — LACTATED RINGERS IV SOLN: INTRAVENOUS | Status: DC

## 2023-10-20 MED ORDER — LIDOCAINE 2% (20 MG/ML) 5 ML SYRINGE
INTRAMUSCULAR | Status: AC
  Filled 2023-10-20: qty 5

## 2023-10-20 MED ORDER — CHLORHEXIDINE GLUCONATE 0.12 % MT SOLN
15.0000 mL | Freq: Once | OROMUCOSAL | Status: AC
  Administered 2023-10-20: 15 mL via OROMUCOSAL
  Filled 2023-10-20: qty 15

## 2023-10-20 MED ORDER — CEFAZOLIN SODIUM-DEXTROSE 2-4 GM/100ML-% IV SOLN
2.0000 g | Freq: Three times a day (TID) | INTRAVENOUS | Status: AC
  Administered 2023-10-20 – 2023-10-21 (×2): 2 g via INTRAVENOUS
  Filled 2023-10-20 (×2): qty 100

## 2023-10-20 MED ORDER — PROTAMINE SULFATE 10 MG/ML IV SOLN
INTRAVENOUS | Status: AC
  Filled 2023-10-20: qty 5

## 2023-10-20 SURGICAL SUPPLY — 44 items
BAG BANDED W/RUBBER/TAPE 36X54 (MISCELLANEOUS) ×2 IMPLANT
BENZOIN TINCTURE PRP APPL 2/3 (GAUZE/BANDAGES/DRESSINGS) IMPLANT
CANISTER SUCTION 3000ML PPV (SUCTIONS) ×2 IMPLANT
CATH BALLN ENROUTE 5X35 (CATHETERS) IMPLANT
CHLORAPREP W/TINT 26 (MISCELLANEOUS) ×4 IMPLANT
CLSR STERI-STRIP ANTIMIC 1/2X4 (GAUZE/BANDAGES/DRESSINGS) IMPLANT
COVER DOME SNAP 22 D (MISCELLANEOUS) ×2 IMPLANT
COVER PROBE W GEL 5X96 (DRAPES) ×2 IMPLANT
DERMABOND ADVANCED .7 DNX12 (GAUZE/BANDAGES/DRESSINGS) IMPLANT
DERMABOND ADVANCED .7 DNX6 (GAUZE/BANDAGES/DRESSINGS) ×2 IMPLANT
DRAPE FEMORAL ANGIO 80X135IN (DRAPES) ×2 IMPLANT
DRSG TEGADERM 2-3/8X2-3/4 SM (GAUZE/BANDAGES/DRESSINGS) IMPLANT
DRSG TEGADERM 4X4.75 (GAUZE/BANDAGES/DRESSINGS) IMPLANT
ELECTRODE REM PT RTRN 9FT ADLT (ELECTROSURGICAL) ×2 IMPLANT
GAUZE SPONGE 4X4 12PLY STRL (GAUZE/BANDAGES/DRESSINGS) ×2 IMPLANT
GLOVE BIO SURGEON STRL SZ8 (GLOVE) ×2 IMPLANT
GOWN STRL REUS W/ TWL LRG LVL3 (GOWN DISPOSABLE) ×4 IMPLANT
GOWN STRL REUS W/ TWL XL LVL3 (GOWN DISPOSABLE) ×2 IMPLANT
GUIDEWIRE ENROUTE 0.014 (WIRE) ×2 IMPLANT
KIT BASIN OR (CUSTOM PROCEDURE TRAY) ×2 IMPLANT
KIT ENCORE 26 ADVANTAGE (KITS) ×2 IMPLANT
KIT INTRODUCER GALT 7 (INTRODUCER) ×2 IMPLANT
KIT TURNOVER KIT B (KITS) ×2 IMPLANT
NDL HYPO 25GX1X1/2 BEV (NEEDLE) IMPLANT
NEEDLE HYPO 25GX1X1/2 BEV (NEEDLE) IMPLANT
NS IRRIG 1000ML POUR BTL (IV SOLUTION) ×2 IMPLANT
PACK CAROTID (CUSTOM PROCEDURE TRAY) ×2 IMPLANT
POSITIONER HEAD DONUT 9IN (MISCELLANEOUS) ×2 IMPLANT
SET MICROPUNCTURE 5F STIFF (MISCELLANEOUS) ×2 IMPLANT
STENT TRANSCAROTID SYSTEM 8X40 (Permanent Stent) IMPLANT
STOPCOCK MORSE 400PSI 3WAY (MISCELLANEOUS) IMPLANT
STRIP CLOSURE SKIN 1/2X4 (GAUZE/BANDAGES/DRESSINGS) IMPLANT
SUT MNCRL AB 4-0 PS2 18 (SUTURE) ×2 IMPLANT
SUT PROLENE 5 0 C 1 24 (SUTURE) ×2 IMPLANT
SUT SILK 2 0 SH (SUTURE) ×2 IMPLANT
SUT VIC AB 3-0 SH 27X BRD (SUTURE) ×2 IMPLANT
SYR 10ML LL (SYRINGE) ×6 IMPLANT
SYR 20ML LL LF (SYRINGE) ×2 IMPLANT
SYR CONTROL 10ML LL (SYRINGE) IMPLANT
SYSTEM TRANSCAROTID NEUROPRTCT (MISCELLANEOUS) ×2 IMPLANT
TOWEL GREEN STERILE (TOWEL DISPOSABLE) ×2 IMPLANT
WATER STERILE IRR 1000ML POUR (IV SOLUTION) ×2 IMPLANT
WIRE BENTSON .035X145CM (WIRE) ×2 IMPLANT
WIRE TORQFLEX AUST .018X40CM (WIRE) IMPLANT

## 2023-10-20 NOTE — Anesthesia Postprocedure Evaluation (Signed)
 Anesthesia Post Note  Patient: Matthew Moreno  Procedure(s) Performed: RIGHT TRANSCAROTID ARTERY REVASCULARIZATION (TCAR) (Right: Neck) ULTRASOUND GUIDANCE, FOR VASCULAR ACCESS (Right: Groin)     Patient location during evaluation: PACU Anesthesia Type: General Level of consciousness: awake and alert Pain management: pain level controlled Vital Signs Assessment: post-procedure vital signs reviewed and stable Respiratory status: spontaneous breathing, nonlabored ventilation, respiratory function stable and patient connected to nasal cannula oxygen Cardiovascular status: blood pressure returned to baseline and stable Postop Assessment: no apparent nausea or vomiting Anesthetic complications: no   No notable events documented.  Last Vitals:  Vitals:   10/20/23 1428 10/20/23 1542  BP: (!) 131/49 118/63  Pulse: 80 85  Resp: 16 16  Temp: 36.4 C 36.8 C  SpO2: 96% 92%    Last Pain:  Vitals:   10/20/23 1235  TempSrc: Oral  PainSc: 0-No pain                 Theotis Flake P Alveda Vanhorne

## 2023-10-20 NOTE — Op Note (Signed)
 DATE OF SERVICE: 10/20/2023  PATIENT:  Matthew Moreno  78 y.o. male  PRE-OPERATIVE DIAGNOSIS:  asymptomatic, critical right carotid artery stenosis  POST-OPERATIVE DIAGNOSIS:  Same  PROCEDURE:   Right transcarotid artery revascularization (TCAR)  SURGEON:  Surgeons and Role:    * Carlene Che, MD - Primary  ASSISTANT: Wynonia Hedges, PA-C  An experienced assistant was required given the complexity of this procedure and the standard of surgical care. My assistant helped with exposure through counter tension, suctioning, ligation and retraction to better visualize the surgical field.  My assistant expedited sewing during the case by following my sutures. Wherever I use the term "we" in the report, my assistant actively helped me with that portion of the procedure.  ANESTHESIA:   general  EBL: minimal  BLOOD ADMINISTERED:none  DRAINS: none   LOCAL MEDICATIONS USED:  NONE  SPECIMEN:  none  COUNTS: confirmed correct.  TOURNIQUET:  none   PATIENT DISPOSITION:  PACU - hemodynamically stable.   Delay start of Pharmacological VTE agent (>24hrs) due to surgical blood loss or risk of bleeding: no  INDICATION FOR PROCEDURE: Matthew Moreno is a 78 y.o. male with critical asymptomatic right carotid artery stenosis. He has recently undergone L TCAR, which he tolerated well. After careful discussion of risks, benefits, and alternatives the patient was offered R TCAR. We specifically discussed risk of stroke, cranial nerve injury, and hematoma. The patient understood and wished to proceed.  OPERATIVE FINDINGS: successful TCAR. Good technical result on completion angiogram. Patient awoke at neurologic baseline in OR. Exam reconfirmed postoperatively.  DESCRIPTION OF PROCEDURE: After identification of the patient in the pre-operative holding area, the patient was transferred to the operating room. The patient was positioned supine on the operating room table. Anesthesia was induced. The  right neck and groins were prepped and draped in standard fashion. A surgical pause was performed confirming correct patient, procedure, and operative location.  Using intraoperative ultrasound the course of the right common carotid artery was mapped on the skin.  A transverse incision was made between the sternal and clavicular heads of the sternocleidomastoid muscle, below the omohyoid. Following longitudinal division of the carotid sheath the jugular vein was partially skeletonized and retracted medially. Once 3 cm of common carotid artery (CCA) were isolated, umbilical tape was placed around the proximal 1/3 of the CCA under direct vision. A 5-O Prolene suture was pre-placed in the anterior wall of the CCA, in a "U stitch" configuration, close to the clavicle to facilitate hemostasis upon removal of the arterial sheath at completion of the TCAR procedure.   The right common femoral vein (CFV) was accessed under ultrasound guidance, using standard Seldinger and micropuncture access technique. The venous return sheath was advanced into the CFV over the 0.035" wire provided. Blood was aspirated from the flow line followed by flushing of the Venous Sheath with heparinized saline. The Venous Sheath was secured to the patient's skin with suture to maintain optimal position in the vessel. Heparin  was given to obtain a therapeutic activated clotting time >250 seconds prior to arterial access.   A 4-French non-stiffened ENHANCE Transcarotid / Peripheral Access set was used, puncturing the artery with the 21G needle through the pre-placed "U" stitch while holding gentle traction on the umbilical tape to stabilize and centralize the CCA within the incision. Careful attention was paid to the change in CCA shape when using the umbilical tape to control or lift the artery. The micropuncture wire was then advanced 3-4 cm into  the CCA and, the 21G needle was removed. The micropuncture sheath was advanced 2-3 cm into the  CCA and the wire and dilator were removed. Pulsatile backflow indicated correct positioning. The provided 0.035" J-tipped guidewire was inserted as close as possible to the bifurcation without engaging the lesion. After micropuncture sheath removal, the Transcarotid Arterial Sheath was advanced to the 3 cm marker and the 0.035" wire and dilator were then removed. Arterial Sheath position was assessed under fluoroscopy in two projections to ensure that the sheath tip was oriented coaxially in the CCA. The Arterial Sheath was sutured to the patient with gentle forward tension. Blood was slowly aspirated followed by flushing with heparinized saline. No ingress of air bubbles through the passive hemostatic valve was observed. The stopcocks were closed. Traction applied to the CCA previously to facilitate access was gently released.  The Flow Controller was connected to the Transcarotid Arterial Sheath, prepared by passively allowing a column of arterial blood to fill the line and connected to the Venous Return Sheath. CCA inflow was occluded proximal to the arteriotomy with a vascular clamp to achieve active flow reversal. To confirm flow reversal, a saline bolus was delivered into the venous flow line on both "High" and "Low" flow settings of the Flow Controller. Angiograms were performed with slow injections of a small amount of contrast filling just past the lesion to minimize antegrade transmission of micro-bubbles.   Prior to lesion manipulation, heart rate (70bpm) and systolic BP (140-141mmHg) were managed upwards to optimize flow reversal and procedural neuroprotection. The lesion was crossed with an 0.014" ENROUTE guidewire and pre-dilation of the lesion was performed with a 5x66mm rapid exchange 0.014" compatible balloon catheter to 8 atmospheres for 10 seconds. Stenting was performed with an 8x33mm ENROUTE Transcarotid stent, sized appropriately to the right CCA.  A total of 8 minutes of flow reversal  was used.  AP angiogram (gentle contrast injections) were performed to confirm stent placement and arterial wall stent apposition. At [facility] case completion, antegrade flow was restored by releasing the clamp on the CCA then closing the NPS stopcocks to the flow lines. The Transcarotid Arterial Sheath was removed and the pre-closure suture was tied. Heparin  reversal was employed. The Venous Return Sheath was removed and hemostasis was achieved with brief manual compression.   Doppler flow was confirmed to be normal across the access site. The neck incision was closed in layers using 3-O vicryl and 4-O monocryl. Clean bandage was applied to the neck.   Upon completion of the case instrument and sharps counts were confirmed correct. The patient was transferred to the PACU in good condition. I was present for all portions of the procedure. Patient awoke at neurologic baseline. Exam reconfirmed postoperatively.  FOLLOW UP PLAN: Assuming a normal postoperative course, I will see the patient in 4 weeks with carotid duplex.   Heber Little. Edgardo Goodwill, MD [facility] Phone Number: [phone] 10/20/2023 8:20 PM

## 2023-10-20 NOTE — Progress Notes (Signed)
  Progress Note    10/20/2023 11:53 AM * Day of Surgery *  Subjective:  seen in PACU. Doing great. No complaints   Vitals:   10/20/23 1115 10/20/23 1130  BP: (!) 104/59 (!) 106/59  Pulse: 94 91  Resp: 11 (!) 8  Temp:    SpO2: 95% 95%   Physical Exam: Cardiac:  regular Lungs:  non labored Incisions:  right neck incision mild strike though on dressings, soft, no hematoma Extremities:  moving all extremities without deficits Neurologic: alert and oriented. Speech coherent. Tongue midline. Smile symmetric  CBC    Component Value Date/Time   WBC 6.6 10/18/2023 1012   RBC 4.81 10/18/2023 1012   HGB 15.1 10/18/2023 1012   HGB 15.2 12/23/2022 1019   HGB 13.4 03/12/2014 0821   HCT 45.1 10/18/2023 1012   HCT 46.2 12/23/2022 1019   HCT 38.9 03/12/2014 0821   PLT 320 10/18/2023 1012   PLT 295 12/23/2022 1019   MCV 93.8 10/18/2023 1012   MCV 93 12/23/2022 1019   MCV 92.8 03/12/2014 0821   MCH 31.4 10/18/2023 1012   MCHC 33.5 10/18/2023 1012   RDW 12.9 10/18/2023 1012   RDW 13.0 12/23/2022 1019   RDW 11.8 03/12/2014 0821   LYMPHSABS 1.2 12/23/2022 1019   LYMPHSABS 0.4 (L) 03/12/2014 0821   MONOABS 0.7 09/04/2018 1906   MONOABS 0.5 03/12/2014 0821   EOSABS 0.1 12/23/2022 1019   BASOSABS 0.0 12/23/2022 1019   BASOSABS 0.0 03/12/2014 0821    BMET    Component Value Date/Time   NA 139 10/18/2023 1012   NA 143 12/23/2022 1019   NA 142 03/26/2014 0923   K 4.2 10/18/2023 1012   K 4.0 03/26/2014 0923   CL 104 10/18/2023 1012   CO2 26 10/18/2023 1012   CO2 26 03/26/2014 0923   GLUCOSE 103 (H) 10/18/2023 1012   GLUCOSE 101 03/26/2014 0923   BUN 11 10/18/2023 1012   BUN 17 12/23/2022 1019   BUN 20.1 03/26/2014 0923   CREATININE 1.14 10/18/2023 1012   CREATININE 1.11 03/30/2017 1130   CREATININE 1.2 03/26/2014 0923   CALCIUM  9.5 10/18/2023 1012   CALCIUM  10.0 03/26/2014 0923   GFRNONAA >60 10/18/2023 1012   GFRAA 72 07/13/2019 1019    INR    Component Value  Date/Time   INR 1.0 10/18/2023 1012     Intake/Output Summary (Last 24 hours) at 10/20/2023 1153 Last data filed at 10/20/2023 1041 Gross per 24 hour  Intake 1100 ml  Output 30 ml  Net 1070 ml     Assessment/Plan:  78 y.o. male is s/p Right TCAR * Day of Surgery *   Neurologically intact Right neck incision is intact. Mild strike through on dressing. No swelling or hematoma Hemodynamically stable Plan is to go to 4E when bed available   Deneen Finical, PA-C [facility] [phone] 10/20/2023 11:53 AM

## 2023-10-20 NOTE — Anesthesia Procedure Notes (Signed)
 Procedure Name: Intubation Date/Time: 10/20/2023 8:54 AM  Performed by: Candance Certain, CRNAPre-anesthesia Checklist: Patient identified, Emergency Drugs available, Suction available and Patient being monitored Patient Re-evaluated:Patient Re-evaluated prior to induction Oxygen Delivery Method: Circle System Utilized Preoxygenation: Pre-oxygenation with 100% oxygen Induction Type: IV induction Ventilation: Mask ventilation without difficulty Laryngoscope Size: Mac and 4 Grade View: Grade I Tube type: Oral Tube size: 7.0 mm Number of attempts: 1 Airway Equipment and Method: Stylet and Oral airway Placement Confirmation: ETT inserted through vocal cords under direct vision, positive ETCO2 and breath sounds checked- equal and bilateral Secured at: 23 cm Tube secured with: Tape Dental Injury: Teeth and Oropharynx as per pre-operative assessment

## 2023-10-20 NOTE — Discharge Instructions (Signed)

## 2023-10-20 NOTE — Transfer of Care (Signed)
 Immediate Anesthesia Transfer of Care Note  Patient: Matthew Moreno  Procedure(s) Performed: RIGHT TRANSCAROTID ARTERY REVASCULARIZATION (TCAR) (Right: Neck) ULTRASOUND GUIDANCE, FOR VASCULAR ACCESS (Right: Groin)  Patient Location: PACU  Anesthesia Type:General  Level of Consciousness: awake, alert , and oriented  Airway & Oxygen Therapy: Patient Spontanous Breathing and Patient connected to face mask oxygen  Post-op Assessment: Report given to RN and Post -op Vital signs reviewed and stable  Post vital signs: Reviewed and stable  Last Vitals:  Vitals Value Taken Time  BP 120/84 10/20/23 1036  Temp    Pulse 107 10/20/23 1040  Resp 29 10/20/23 1040  SpO2 100 % 10/20/23 1040  Vitals shown include unfiled device data.  Last Pain:  Vitals:   10/20/23 0702  TempSrc: Oral  PainSc:          Complications: No notable events documented.

## 2023-10-20 NOTE — Anesthesia Procedure Notes (Signed)
 Arterial Line Insertion Start/End6/08/2023 7:50 AM, 10/20/2023 8:00 AM Performed by: Micheal Agent, DO  Patient location: Pre-op. Preanesthetic checklist: patient identified, IV checked, site marked, risks and benefits discussed, surgical consent, monitors and equipment checked, pre-op evaluation, timeout performed and anesthesia consent Lidocaine  1% used for infiltration Right, radial was placed Catheter size: 20 G Hand hygiene performed , maximum sterile barriers used  and Seldinger technique used Allen's test indicative of satisfactory collateral circulation Attempts: 1 Procedure performed using ultrasound guided technique. Ultrasound Notes:needle tip was noted to be adjacent to the nerve/plexus identified and no ultrasound evidence of intravascular and/or intraneural injection Following insertion, Biopatch. Post procedure assessment: normal  Patient tolerated the procedure well with no immediate complications. Additional procedure comments: Performed by SRNA.

## 2023-10-20 NOTE — Interval H&P Note (Signed)
 History and Physical Interval Note:  10/20/2023 8:23 AM  Matthew Moreno  has presented today for surgery, with the diagnosis of Bilateral carotid stenosis.  The various methods of treatment have been discussed with the patient and family. After consideration of risks, benefits and other options for treatment, the patient has consented to  Procedure(s): TRANSCAROTID ARTERY REVASCULARIZATION (TCAR) (Right) as a surgical intervention.  The patient's history has been reviewed, patient examined, no change in status, stable for surgery.  I have reviewed the patient's chart and labs.  Questions were answered to the patient's satisfaction.     Carlene Che

## 2023-10-21 ENCOUNTER — Encounter (HOSPITAL_COMMUNITY): Payer: Self-pay | Admitting: Vascular Surgery

## 2023-10-21 LAB — LIPID PANEL
Cholesterol: 114 mg/dL (ref 0–200)
HDL: 31 mg/dL — ABNORMAL LOW (ref >40)
LDL Cholesterol: 57 mg/dL (ref 0–99)
Total CHOL/HDL Ratio: 3.7 ratio
Triglycerides: 130 mg/dL (ref <150)
VLDL: 26 mg/dL (ref 0–40)

## 2023-10-21 LAB — BASIC METABOLIC PANEL WITH GFR
Anion gap: 7 (ref 5–15)
BUN: 17 mg/dL (ref 8–23)
CO2: 23 mmol/L (ref 22–32)
Calcium: 8.8 mg/dL — ABNORMAL LOW (ref 8.9–10.3)
Chloride: 107 mmol/L (ref 98–111)
Creatinine, Ser: 1.24 mg/dL (ref 0.61–1.24)
GFR, Estimated: 60 mL/min — ABNORMAL LOW (ref >60)
Glucose, Bld: 132 mg/dL — ABNORMAL HIGH (ref 70–99)
Potassium: 3.9 mmol/L (ref 3.5–5.1)
Sodium: 137 mmol/L (ref 135–145)

## 2023-10-21 LAB — CBC
HCT: 35.3 % — ABNORMAL LOW (ref 39.0–52.0)
Hemoglobin: 12.2 g/dL — ABNORMAL LOW (ref 13.0–17.0)
MCH: 31.8 pg (ref 26.0–34.0)
MCHC: 34.6 g/dL (ref 30.0–36.0)
MCV: 91.9 fL (ref 80.0–100.0)
Platelets: 293 10*3/uL (ref 150–400)
RBC: 3.84 MIL/uL — ABNORMAL LOW (ref 4.22–5.81)
RDW: 13 % (ref 11.5–15.5)
WBC: 12 10*3/uL — ABNORMAL HIGH (ref 4.0–10.5)
nRBC: 0 % (ref 0.0–0.2)

## 2023-10-21 NOTE — Progress Notes (Addendum)
  Progress Note    10/21/2023 6:36 AM 1 Day Post-Op  Subjective:  no complaints; had a little trouble swallowing some cake yesterday but no issues since then.  He has voided and walked.   Afebrile HR 80's-100's 100's-130's systolic 92% RA  Gtts:  none   Vitals:   10/21/23 0342 10/21/23 0400  BP: (!) 129/46   Pulse: 87 89  Resp: 16 16  Temp: 97.7 F (36.5 C)   SpO2: 91% 91%     Physical Exam: Neuro:  in tact; tongue is midline Lungs:  non labored Incision:  right neck incision with steri strips in place with some dried blood over them; right groin soft without hematoma.  CBC    Component Value Date/Time   WBC 14.0 (H) 10/20/2023 1300   RBC 4.68 10/20/2023 1300   HGB 14.9 10/20/2023 1300   HGB 15.2 12/23/2022 1019   HGB 13.4 03/12/2014 0821   HCT 42.5 10/20/2023 1300   HCT 46.2 12/23/2022 1019   HCT 38.9 03/12/2014 0821   PLT 307 10/20/2023 1300   PLT 295 12/23/2022 1019   MCV 90.8 10/20/2023 1300   MCV 93 12/23/2022 1019   MCV 92.8 03/12/2014 0821   MCH 31.8 10/20/2023 1300   MCHC 35.1 10/20/2023 1300   RDW 12.8 10/20/2023 1300   RDW 13.0 12/23/2022 1019   RDW 11.8 03/12/2014 0821   LYMPHSABS 1.2 12/23/2022 1019   LYMPHSABS 0.4 (L) 03/12/2014 0821   MONOABS 0.7 09/04/2018 1906   MONOABS 0.5 03/12/2014 0821   EOSABS 0.1 12/23/2022 1019   BASOSABS 0.0 12/23/2022 1019   BASOSABS 0.0 03/12/2014 0821    BMET    Component Value Date/Time   NA 139 10/18/2023 1012   NA 143 12/23/2022 1019   NA 142 03/26/2014 0923   K 4.2 10/18/2023 1012   K 4.0 03/26/2014 0923   CL 104 10/18/2023 1012   CO2 26 10/18/2023 1012   CO2 26 03/26/2014 0923   GLUCOSE 103 (H) 10/18/2023 1012   GLUCOSE 101 03/26/2014 0923   BUN 11 10/18/2023 1012   BUN 17 12/23/2022 1019   BUN 20.1 03/26/2014 0923   CREATININE 1.17 10/20/2023 1300   CREATININE 1.11 03/30/2017 1130   CREATININE 1.2 03/26/2014 0923   CALCIUM  9.5 10/18/2023 1012   CALCIUM  10.0 03/26/2014 0923   GFRNONAA >60  10/20/2023 1300   GFRAA 72 07/13/2019 1019     Intake/Output Summary (Last 24 hours) at 10/21/2023 0636 Last data filed at 10/21/2023 0600 Gross per 24 hour  Intake 1351.03 ml  Output 830 ml  Net 521.03 ml     Assessment/Plan:  This is a 78 y.o. male who is s/p right TCAR  1 Day Post-Op  -pt is doing well this am. -pt neuro exam is in tact -had a little trouble with swallowing some cake yesterday but no issues since then.   -pain is well controlled.  -pt has ambulated -pt has voided -continue asa/plavix .  No statin due to severe myalgias.  -he has pain medication at home and therefore will not prescribe. -f/u with [facility] in 4 weeks with carotid duplex when Dr. Edgardo Goodwill is in [facility] clinic   Maryanna Smart, [place] [facility] [phone]

## 2023-10-21 NOTE — TOC Transition Note (Signed)
 Transition of Care (TOC) - Discharge Note Sherin Dingwall RN, BSN Transitions of Care Unit 4E- RN Case Manager See Treatment Team for direct phone #   Patient Details  Name: Matthew Moreno MRN: [id number] Date of Birth: May 18, 1946  Transition of Care [facility]) CM/SW Contact:  Rox Cope, RN Phone Number: 10/21/2023, 1:03 PM   Clinical Narrative:    Pt stable for transition home today, per bedside RN pt mobilizing independently.  No HH or DME needs noted.   CM has received notice from [facility] liaison that [facility] office made referral for [facility] needs- liaison updated and will follow up with pt.   Pt home w/ brother, has transportation home.    Final next level of care: Home/Self Care Barriers to Discharge: No Barriers Identified   Patient Goals and CMS Choice Patient states their goals for this hospitalization and ongoing recovery are:: return home          Discharge Placement                 Home      Discharge Plan and Services Additional resources added to the After Visit Summary for     Discharge Planning Services: CM Consult Post Acute Care Choice: NA          DME Arranged: N/A DME Agency: NA         HH Agency: [facility] Date HH Agency Contacted: 10/21/23   Representative spoke with at [facility] Agency: Glenora Laos  Social Drivers of Health (SDOH) Interventions SDOH Screenings   Food Insecurity: No Food Insecurity (10/20/2023)  Housing: Low Risk  (10/20/2023)  Transportation Needs: No Transportation Needs (10/20/2023)  Utilities: Not At Risk (10/20/2023)  Depression (PHQ2-9): Low Risk  (10/20/2022)  Financial Resource Strain: Low Risk  (10/20/2022)  Physical Activity: Sufficiently Active (10/20/2022)  Social Connections: Unknown (10/20/2023)  Stress: No Stress Concern Present (10/20/2022)  Tobacco Use: Medium Risk (10/20/2023)     Readmission Risk Interventions     No data to display

## 2023-10-21 NOTE — Progress Notes (Signed)
Discharge instructions provided to patient. All medications, follow up appointments, and discharge instructions provided. IV out. Monitor off CCMD notified. Discharging home.

## 2023-10-21 NOTE — Discharge Summary (Signed)
 Discharge Summary     Matthew Moreno 1945-09-27 78 y.o. male  [id number]  Admission Date: 10/20/2023  Discharge Date: 10/21/2023  Physician: Matthew Che, MD  Admission Diagnosis: Bilateral carotid artery stenosis [I65.23] S/P carotid endarterectomy [Z98.890] Carotid stenosis, asymptomatic, right [I65.21]   HPI:   This is a 78 y.o. male ***  Hospital Course:  The patient was admitted to the hospital and taken to the operating room on 10/20/2023 and underwent ***    Findings: ***  The pt tolerated the procedure well and was transported to the PACU in {DESC; GOOD/FAIR/POOR:18685} condition.   By POD 1, the pt neuro status ***   Recent Labs    10/18/23 1012 10/21/23 0500  NA 139 137  K 4.2 3.9  CL 104 107  CO2 26 23  GLUCOSE 103* 132*  BUN 11 17  CALCIUM  9.5 8.8*   Recent Labs    10/18/23 1012 10/20/23 1300  WBC 6.6 14.0*  HGB 15.1 14.9  HCT 45.1 42.5  PLT 320 307   Recent Labs    10/18/23 1012  INR 1.0     Discharge Instructions     Discharge patient   Complete by: As directed    Discharge home after pt has eaten breakfast without difficulty, walked, voided and been seen by Dr. [name].  Thanks   Discharge disposition: 01-Home or Self Care   Discharge patient date: 10/21/2023       Discharge Diagnosis:  Bilateral carotid artery stenosis [I65.23] S/P carotid endarterectomy [Z98.890] Carotid stenosis, asymptomatic, right [I65.21]  Secondary Diagnosis: Patient Active Problem List   Diagnosis Date Noted   S/P carotid endarterectomy 10/20/2023   Carotid stenosis, asymptomatic, right 10/20/2023   Carotid stenosis 08/23/2023   Carotid artery stenosis 08/23/2023   Squamous cell carcinoma in situ of skin 01/06/2023   Carotid stenosis, asymptomatic, bilateral 12/23/2022   Basal cell carcinoma of scalp 01/14/2022   Statin myopathy 04/09/2020   History of COVID-19 01/14/2020   History of shingles 07/13/2019   Benign prostatic hyperplasia  with incomplete bladder emptying 07/13/2019   Stage 3a chronic kidney disease (HCC) 07/13/2019   Actinic keratosis 07/13/2019   H/O bilateral cataract extraction 03/30/2017   Essential hypertension 01/11/2017   Arthritis 11/11/2015   Family history of heart disease 11/11/2015   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   Hypertriglyceridemia 06/23/2011   Coronary artery disease involving native coronary artery of native heart without angina pectoris 06/23/2011   Former smoker 06/23/2011   GERD (gastroesophageal reflux disease) 03/29/2009   Past Medical History:  Diagnosis Date   Arthritis    bilateral hands   Basal cell carcinoma of scalp 01/14/2022   CAD (coronary artery disease) 2008   Carotid artery disease (HCC)    COPD (chronic obstructive pulmonary disease) (HCC)    Dyslipidemia    Dysphagia    Family history of heart disease    GERD (gastroesophageal reflux disease)    History of kidney stones    Hypertension    Panic attacks    Peripheral vascular disease (HCC)    Carotid Stenosis   S/P radiation therapy 02/12/2014-04/02/2014   Left tonsil and bilateral neck / 70 Gy in 35 fractions to gross disease, 63 Gy in 35 fractions to high risk nodalechelons, and 56 Gy in 35 fractions to intermediate risk nodal echelons    Statin intolerance    Tonsil cancer (HCC) 01/05/2014   left    Allergies as of 10/21/2023       Reactions  Bystolic [nebivolol Hcl]    Unknown reaction    Lipitor [atorvastatin] Other (See Comments)   Muscle aches with statins   Lisinopril    Unknown reaction    Pravastatin    Muscle aches   Red Yeast Rice [cholestin]    Muscle pain   Sulfonamide Derivatives Hives   Zocor [simvastatin]    Muscle aches        Medication List     TAKE these medications    ALPRAZolam  0.5 MG tablet Commonly known as: XANAX  Take 1 tablet (0.5 mg total) by mouth at bedtime as needed.   amLODipine  5 MG tablet Commonly known as: NORVASC  Take 1 tablet (5 mg total) by  mouth daily.   aspirin  81 MG tablet Take 81 mg by mouth daily.   clopidogrel  75 MG tablet Commonly known as: PLAVIX  Take 1 tablet (75 mg total) by mouth daily.   ezetimibe  10 MG tablet Commonly known as: ZETIA  Take 1 tablet (10 mg total) by mouth daily.   fenofibrate  160 MG tablet TAKE 1 TABLET(160 MG) BY MOUTH DAILY   fexofenadine 180 MG tablet Commonly known as: ALLEGRA Take 180 mg by mouth daily as needed for allergies or rhinitis.   metoprolol  tartrate 25 MG tablet Commonly known as: LOPRESSOR  1/2 tablet twice a day   omeprazole  20 MG capsule Commonly known as: PRILOSEC Take 20 mg by mouth every 3 (three) days.   oxyCODONE -acetaminophen  5-325 MG tablet Commonly known as: PERCOCET/ROXICET Take 1 tablet by mouth every 6 (six) hours as needed for moderate pain (pain score 4-6).   Ventolin  HFA 108 (90 Base) MCG/ACT inhaler Generic drug: albuterol  INHALE 2 PUFFS INTO THE LUNGS EVERY 6 HOURS AS NEEDED FOR WHEEZING OR SHORTNESS OF BREATH   Zypitamag  4 MG Tabs Generic drug: Pitavastatin  Magnesium  TAKE 1 TABLET BY MOUTH EVERY DAY         [facility] Discharge Instructions Carotid Endarterectomy (CEA)  Please refer to the following instructions for your post-procedure care. Your surgeon or physician assistant will discuss any changes with you.  Activity  You are encouraged to walk as much as you can. You can slowly return to normal activities but must avoid strenuous activity and heavy lifting until your doctor tell you it's OK. Avoid activities such as vacuuming or swinging a golf club. You can drive after one week if you are comfortable and you are no longer taking prescription pain medications. It is normal to feel tired for serval weeks after your surgery. It is also normal to have difficulty with sleep habits, eating, and bowel movements after surgery. These will go away with time.  Bathing/Showering  You may shower after you come  home. Do not soak in a bathtub, hot tub, or swim until the incision heals completely.  Incision Care  Shower every day. Clean your incision with mild soap and water. Pat the area dry with a clean towel. You do not need a bandage unless otherwise instructed. Do not apply any ointments or creams to your incision. You may have skin glue on your incision. Do not peel it off. It will come off on its own in about one week. Your incision may feel thickened and raised for several weeks after your surgery. This is normal and the skin will soften over time. For Men Only: It's OK to shave around the incision but do not shave the incision itself for 2 weeks. It is common to have numbness under your chin that  could last for several months.  Diet  Resume your normal diet. There are no special food restrictions following this procedure. A low fat/low cholesterol diet is recommended for all patients with vascular disease. In order to heal from your surgery, it is CRITICAL to get adequate nutrition. Your body requires vitamins, minerals, and protein. Vegetables are the best source of vitamins and minerals. Vegetables also provide the perfect balance of protein. Processed food has little nutritional value, so try to avoid this.  Medications  Resume taking all of your medications unless your doctor or physician assistant tells you not to.  If your incision is causing pain, you may take over-the- counter pain relievers such as acetaminophen  (Tylenol ). If you were prescribed a stronger pain medication, please be aware these medications can cause nausea and constipation.  Prevent nausea by taking the medication with a snack or meal. Avoid constipation by drinking plenty of fluids and eating foods with a high amount of fiber, such as fruits, vegetables, and grains.  Do not take Tylenol  if you are taking prescription pain medications.  Follow Up  Our office will schedule a follow up appointment 2-3 weeks following  discharge.  Please call us  immediately for any of the following conditions  Increased pain, redness, drainage (pus) from your incision site. Fever of 101 degrees or higher. If you should develop stroke (slurred speech, difficulty swallowing, weakness on one side of your body, loss of vision) you should call 911 and go to the nearest emergency room.  Reduce your risk of vascular disease:  Stop smoking. If you would like help call [facility] at 1-800-QUIT-NOW (8031743235) or [place] at 5038226505. Manage your cholesterol Maintain a desired weight Control your diabetes Keep your blood pressure down  If you have any questions, please call the office at [phone].  Prescriptions given: No narcotics prescribed as he has at home  Disposition: home  Patient's condition: is Good  Follow up: 1. [facility] in 4 weeks with carotid duplex on day Dr. [name] is in [place] clinic.    Maryanna Smart, PA-C [facility] [phone]   --- For [facility] use ---   Modified Rankin score at D/C (0-6): 0  IV medication needed for:  1. Hypertension: No 2. Hypotension: No  Post-op Complications: No  1. Post-op CVA or TIA: No  If yes: Event classification (right eye, left eye, right cortical, left cortical, verterobasilar, other): n/a  If yes: Timing of event (intra-op, <6 hrs post-op, >=6 hrs post-op, unknown): n/a  2. CN injury: No  If yes: CN n/a injuried   3. Myocardial infarction: No  If yes: Dx by (EKG or clinical, Troponin): n/a  4.  CHF: No  5.  Dysrhythmia (new): No  6. Wound infection: No  7. Reperfusion symptoms: No  8. Return to OR: No  If yes: return to OR for (bleeding, neurologic, other CEA incision, other): n/a  Discharge medications: Statin use:  No ASA use:  Yes   Beta blocker use:  No ACE-Inhibitor use:  No  ARB use:  No CCB use: Yes P2Y12 Antagonist use: Yes, [x ] Plavix , [ ]  Plasugrel, [ ]  Ticlopinine, [ ]  Ticagrelor,  [ ]  Other, [ ]  No for medical reason, [ ]  Non-compliant, [ ]  Not-indicated Anti-coagulant use:  No, [ ]  Warfarin, [ ]  Rivaroxaban, [ ]  Dabigatran,

## 2023-10-21 NOTE — Progress Notes (Signed)
 PHARMACIST LIPID MONITORING   Matthew Moreno is a 78 y.o. male admitted on 10/20/2023 with PAD.  Pharmacy has been consulted to optimize lipid-lowering therapy with the indication of secondary prevention for clinical ASCVD.  Recent Labs:  Lipid Panel (last 6 months):   No results found for: "CHOL", "TRIG", "HDL", "CHOLHDL", "VLDL", "LDLCALC", "LDLDIRECT"  Hepatic function panel (last 6 months):   Lab Results  Component Value Date   AST 25 10/18/2023   ALT 17 10/18/2023   ALKPHOS 50 10/18/2023   BILITOT 0.8 10/18/2023    SCr (since admission):   Serum creatinine: 1.24 mg/dL 16/10/96 0454 Estimated creatinine clearance: 46.6 mL/min  Current therapy and lipid therapy tolerance Current lipid-lowering therapy: Pitavastatin  4mg  qday and Zetia  10mg  qday Previous lipid-lowering therapies (if applicable): lipitor/zocor/pravastatin Documented or reported allergies or intolerances to lipid-lowering therapies (if applicable): lipitor/zocor/pravastatin  Assessment:   Patient prefers no changes in lipid-lowering therapy at this time due to allergy  Plan:    1.Statin intensity (high intensity recommended for all patients regardless of the LDL):  Statin intolerance noted. No statin changes due to serious side effects (ex. Myalgias with at least 2 different statins).  2.Add ezetimibe  (if any one of the following):     3.Refer to lipid clinic:   No  4.Follow-up with:  Primary care provider - Watson Hacking, MD  5.Follow-up labs after discharge:  No changes in lipid therapy, repeat a lipid panel in one year.      Ivery Marking, PharmD, BCIDP, AAHIVP, CPP Infectious Disease Pharmacist 10/21/2023 7:16 AM

## 2023-10-22 LAB — POCT ACTIVATED CLOTTING TIME: Activated Clotting Time: 273 s

## 2023-11-16 ENCOUNTER — Ambulatory Visit (INDEPENDENT_AMBULATORY_CARE_PROVIDER_SITE_OTHER): Payer: Medicare Other

## 2023-11-16 VITALS — BP 128/60 | HR 66 | Temp 97.8°F | Ht 67.0 in | Wt 155.0 lb

## 2023-11-16 DIAGNOSIS — Z0000 Encounter for general adult medical examination without abnormal findings: Secondary | ICD-10-CM | POA: Diagnosis not present

## 2023-11-16 NOTE — Patient Instructions (Signed)
 Mr. Rokosz , Thank you for taking time out of your busy schedule to complete your Annual Wellness Visit with me. I enjoyed our conversation and look forward to speaking with you again next year. I, as well as your care team,  appreciate your ongoing commitment to your health goals. Please review the following plan we discussed and let me know if I can assist you in the future. Your Game plan/ To Do List    Referrals: If you haven't heard from the office you've been referred to, please reach out to them at the phone provided.  N/a Follow up Visits: Next Medicare AWV with our clinical staff: 11/28/2024 at 10:50   Have you seen your provider in the last 6 months (3 months if uncontrolled diabetes)? No Next Office Visit with your provider: 01/06/2024 at 9:15  Clinician Recommendations:  Aim for 30 minutes of exercise or brisk walking, 6-8 glasses of water, and 5 servings of fruits and vegetables each day.       This is a list of the screening recommended for you and due dates:  Health Maintenance  Topic Date Due   Zoster (Shingles) Vaccine (1 of 2) 04/29/1965   Pneumococcal Vaccine for age over 54 (4 of 4 - PCV20 or PCV21) 11/10/2020   COVID-19 Vaccine (7 - Pfizer risk 2024-25 season) 08/24/2023   Flu Shot  12/17/2023   Medicare Annual Wellness Visit  11/15/2024   DTaP/Tdap/Td vaccine (5 - Td or Tdap) 05/01/2028   Hepatitis C Screening  Completed   Hepatitis B Vaccine  Aged Out   HPV Vaccine  Aged Out   Meningitis B Vaccine  Aged Out   Colon Cancer Screening  Discontinued   Cologuard (Stool DNA test)  Discontinued    Advanced directives: (In Chart) A copy of your advanced directives are scanned into your chart should your provider ever need it. Advance Care Planning is important because it:  [x]  Makes sure you receive the medical care that is consistent with your values, goals, and preferences  [x]  It provides guidance to your family and loved ones and reduces their decisional burden  about whether or not they are making the right decisions based on your wishes.  Follow the link provided in your after visit summary or read over the paperwork we have mailed to you to help you started getting your Advance Directives in place. If you need assistance in completing these, please reach out to us  so that we can help you!  See attachments for Preventive Care and Fall Prevention Tips.

## 2023-11-16 NOTE — Progress Notes (Signed)
 Subjective:   Matthew Moreno is a 78 y.o. who presents for a Medicare Wellness preventive visit.  As a reminder, Annual Wellness Visits don't include a physical exam, and some assessments may be limited, especially if this visit is performed virtually. We may recommend an in-person follow-up visit with your provider if needed.  Visit Complete: In person    Persons Participating in Visit: Patient.  AWV Questionnaire: Yes: Patient Medicare AWV questionnaire was completed by the patient on 11/09/2023; I have confirmed that all information answered by patient is correct and no changes since this date.  Cardiac Risk Factors include: advanced age (>64men, >87 women);hypertension;male gender     Objective:    Today's Vitals   11/16/23 1029  BP: 128/60  Pulse: 66  Temp: 97.8 F (36.6 C)  TempSrc: Oral  SpO2: 98%  Weight: 155 lb (70.3 kg)  Height: 5' 7 (1.702 m)   Body mass index is 24.28 kg/m.     11/16/2023   10:34 AM 10/20/2023    1:00 PM 10/18/2023    9:45 AM 08/23/2023    6:06 AM 08/13/2023    1:12 PM 10/20/2022   11:46 AM 11/07/2021    9:47 AM  Advanced Directives  Does Patient Have a Medical Advance Directive? Yes Yes Yes Yes Yes Yes Yes  Type of Estate agent of [place];Living will Healthcare Power of [employer] of [place];Living will Healthcare Power of [place];Living will Healthcare Power of [place];Living will Healthcare Power of [place];Living will Living will  Does patient want to make changes to medical advance directive?  No - Patient declined  No - Patient declined     Copy of Healthcare Power of Attorney in Chart? Yes - validated most recent copy scanned in chart (See row information) No - copy requested No - copy requested No - copy requested Yes - validated most recent copy scanned in chart (See row information) Yes - validated most recent copy scanned in chart (See row information)     Current Medications  (verified) Outpatient Encounter Medications as of 11/16/2023  Medication Sig   ALPRAZolam  (XANAX ) 0.5 MG tablet Take 1 tablet (0.5 mg total) by mouth at bedtime as needed.   amLODipine  (NORVASC ) 5 MG tablet Take 1 tablet (5 mg total) by mouth daily.   aspirin  81 MG tablet Take 81 mg by mouth daily.   clopidogrel  (PLAVIX ) 75 MG tablet Take 1 tablet (75 mg total) by mouth daily.   ezetimibe  (ZETIA ) 10 MG tablet Take 1 tablet (10 mg total) by mouth daily.   fenofibrate  160 MG tablet TAKE 1 TABLET(160 MG) BY MOUTH DAILY   fexofenadine (ALLEGRA) 180 MG tablet Take 180 mg by mouth daily as needed for allergies or rhinitis.   metoprolol  tartrate (LOPRESSOR ) 25 MG tablet 1/2 tablet twice a day   omeprazole  (PRILOSEC) 20 MG capsule Take 20 mg by mouth every 3 (three) days.   Pitavastatin  Magnesium  (ZYPITAMAG ) 4 MG TABS TAKE 1 TABLET BY MOUTH EVERY DAY   VENTOLIN  HFA 108 (90 Base) MCG/ACT inhaler INHALE 2 PUFFS INTO THE LUNGS EVERY 6 HOURS AS NEEDED FOR WHEEZING OR SHORTNESS OF BREATH   oxyCODONE -acetaminophen  (PERCOCET/ROXICET) 5-325 MG tablet Take 1 tablet by mouth every 6 (six) hours as needed for moderate pain (pain score 4-6).   Facility-Administered Encounter Medications as of 11/16/2023  Medication   influenza  inactive virus vaccine (FLUZONE/FLUARIX) injection 0.5 mL    Allergies (verified) Bystolic [nebivolol hcl], Lipitor [atorvastatin], Lisinopril, Pravastatin, Red yeast  rice [cholestin], Sulfonamide derivatives, and Zocor [simvastatin]   History: Past Medical History:  Diagnosis Date   Arthritis    bilateral hands   Basal cell carcinoma of scalp 01/14/2022   CAD (coronary artery disease) 2008   Carotid artery disease (HCC)    COPD (chronic obstructive pulmonary disease) (HCC)    Dyslipidemia    Dysphagia    Family history of heart disease    GERD (gastroesophageal reflux disease)    History of kidney stones    Hypertension    Panic attacks    Peripheral vascular disease (HCC)     Carotid Stenosis   S/P radiation therapy 02/12/2014-04/02/2014   Left tonsil and bilateral neck / 70 Gy in 35 fractions to gross disease, 63 Gy in 35 fractions to high risk nodalechelons, and 56 Gy in 35 fractions to intermediate risk nodal echelons    Statin intolerance    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 RONAL Ly)   COLONOSCOPY  2010   Dr. Teressa   ESOPHAGOGASTRODUODENOSCOPY N/A 09/04/2018   Procedure: ESOPHAGOGASTRODUODENOSCOPY (EGD);  Surgeon: Rollin Dover, MD;  Location: [facility] ENDOSCOPY;  Service: Endoscopy;  Laterality: N/A;   EYE SURGERY Bilateral    Cataract   FOREIGN BODY REMOVAL  09/04/2018   Procedure: FOREIGN BODY REMOVAL;  Surgeon: Rollin Dover, MD;  Location: [facility] ENDOSCOPY;  Service: Endoscopy;;   MULTIPLE TOOTH EXTRACTIONS     All top teeth and most of bottom teeth   SKIN BIOPSY Right 12/15/2021   basal cell carcinoma of the scalp   TRANSCAROTID ARTERY REVASCULARIZATION  Left 08/23/2023   Procedure: LEFT TRANSCAROTID ARTERY REVASCULARIZATION (TCAR);  Surgeon: Magda Debby SAILOR, MD;  Location: [facility] OR;  Service: Vascular;  Laterality: Left;   TRANSCAROTID ARTERY REVASCULARIZATION  Right 10/20/2023   Procedure: RIGHT TRANSCAROTID ARTERY REVASCULARIZATION (TCAR);  Surgeon: Magda Debby SAILOR, MD;  Location: [facility] OR;  Service: Vascular;  Laterality: Right;  Right groin was accessed. Superficial layers were closed.   ULTRASOUND GUIDANCE FOR VASCULAR ACCESS Right 08/23/2023   Procedure: ULTRASOUND GUIDANCE, FOR VASCULAR ACCESS, RIGHT FEMORAL VEIN;  Surgeon: Magda Debby SAILOR, MD;  Location: [facility] OR;  Service:  Vascular;  Laterality: Right;   ULTRASOUND GUIDANCE FOR VASCULAR ACCESS Right 10/20/2023   Procedure: ULTRASOUND GUIDANCE, FOR VASCULAR ACCESS;  Surgeon: Magda Debby SAILOR, MD;  Location: [facility] OR;  Service: Vascular;  Laterality: Right;   Family History  Problem Relation Age of Onset   Heart Problems Mother    Heart Problems Father    Social History   Socioeconomic History   Marital status: Widowed    Spouse name: Not on file   Number of children: 2   Years of education: Not on file   Highest education level: 10th grade  Occupational History   Occupation: pipe fitter    Employer: RETIRED    Comment: Retired  Tobacco Use   Smoking status: Former    Current packs/day: 0.00    Average packs/day: 0.8 packs/day for 20.0 years (15.0 ttl pk-yrs)    Types: Cigarettes    Start date: 11/02/1963    Quit  date: 11/02/1983    Years since quitting: 40.0   Smokeless tobacco: Never   Tobacco comments:    Stopped Smoking ~ 20 years ago  Vaping Use   Vaping status: Never Used  Substance and Sexual Activity   Alcohol use: Not Currently    Alcohol/week: 1.0 standard drink of alcohol    Types: 1 Standard drinks or equivalent per week    Comment: burbon   Drug use: Not Currently    Types: Marijuana   Sexual activity: Not Currently  Other Topics Concern   Not on file  Social History Narrative   Patient is widowed. He had one son and one daughter. The son passed away from Tylenol  toxicity.   Social Drivers of Corporate investment banker Strain: Low Risk  (11/09/2023)   Overall Financial Resource Strain (CARDIA)    Difficulty of Paying Living Expenses: Not very hard  Food Insecurity: No Food Insecurity (11/09/2023)   Hunger Vital Sign    Worried About Running Out of Food in the Last Year: Never true    Ran Out of Food in the Last Year: Never true  Transportation Needs: No Transportation Needs (11/09/2023)   PRAPARE - Administrator, Civil Service (Medical): No    Lack of  Transportation (Non-Medical): No  Physical Activity: Insufficiently Active (11/09/2023)   Exercise Vital Sign    Days of Exercise per Week: 3 days    Minutes of Exercise per Session: 30 min  Stress: No Stress Concern Present (11/09/2023)   [employer] of Occupational Health - Occupational Stress Questionnaire    Feeling of Stress: Only a little  Social Connections: Socially Isolated (11/09/2023)   Social Connection and Isolation Panel    Frequency of Communication with Friends and Family: More than three times a week    Frequency of Social Gatherings with Friends and Family: More than three times a week    Attends Religious Services: Never    Database administrator or Organizations: No    Attends Engineer, structural: Not on file    Marital Status: Divorced    Tobacco Counseling Counseling given: Not Answered Tobacco comments: Stopped Smoking ~ 20 years ago    Clinical Intake:  Pre-visit preparation completed: Yes  Pain : No/denies pain     Nutritional Status: BMI of 19-24  Normal Nutritional Risks: None Diabetes: No  No results found for: HGBA1C   How often do you need to have someone help you when you read instructions, pamphlets, or other written materials from your doctor or pharmacy?: 1 - Never  Interpreter Needed?: No  Information entered by :: NAllen LPN   Activities of Daily Living     11/09/2023   10:54 AM 10/20/2023    1:00 PM  In your present state of health, do you have any difficulty performing the following activities:  Hearing? 0 0  Vision? 0 0  Difficulty concentrating or making decisions? 0 0  Walking or climbing stairs? 0   Dressing or bathing? 0   Doing errands, shopping? 0 0  Preparing Food and eating ? N   Using the Toilet? N   In the past six months, have you accidently leaked urine? N   Do you have problems with loss of bowel control? N   Managing your Medications? N   Managing your Finances? N   Housekeeping or managing  your Housekeeping? N     Patient Care Team: Joyce Norleen BROCKS, MD as PCP -  General (Family Medicine) Izell Domino, MD as Attending Physician (Radiation Oncology) Alray Charlie LABOR, RN (Inactive) as Oncology Nurse Navigator  I have updated your Care Teams any recent Medical Services you may have received from other providers in the past year.     Assessment:   This is a routine wellness examination for Matthew Moreno.  Hearing/Vision screen Hearing Screening - Comments:: Denies hearing issues Vision Screening - Comments:: No regular eye exams   Goals Addressed             This Visit's Progress    Patient Stated       11/16/2023, stay healthy       Depression Screen     11/16/2023   10:35 AM 10/20/2022   11:47 AM 11/07/2021    9:49 AM 08/01/2021    8:26 AM 07/18/2020   11:58 AM 07/13/2019    9:33 AM 03/02/2019    8:18 AM  PHQ 2/9 Scores  PHQ - 2 Score 0 0 0 0 0 0 0  PHQ- 9 Score 0 0 0        Fall Risk     11/09/2023   10:54 AM 10/20/2022   11:46 AM 10/19/2022    1:12 PM 11/07/2021    9:49 AM 11/04/2021    2:51 PM  Fall Risk   Falls in the past year? 0 0 0 0 0  Number falls in past yr: 0 0 0 0 0  Injury with Fall? 0 0 0 0 0  Risk for fall due to : Medication side effect Medication side effect  Medication side effect   Follow up Falls prevention discussed;Falls evaluation completed Falls prevention discussed;Education provided;Falls evaluation completed  Falls evaluation completed;Education provided;Falls prevention discussed       Data saved with a previous flowsheet row definition    MEDICARE RISK AT HOME:  Medicare Risk at Home Any stairs in or around the home?: (Patient-Rptd) Yes If so, are there any without handrails?: (Patient-Rptd) No Home free of loose throw rugs in walkways, pet beds, electrical cords, etc?: (Patient-Rptd) Yes Adequate lighting in your home to reduce risk of falls?: (Patient-Rptd) Yes Life alert?: (Patient-Rptd) No Use of a cane, walker or w/c?:  (Patient-Rptd) No Grab bars in the bathroom?: (Patient-Rptd) No Shower chair or bench in shower?: (Patient-Rptd) No Elevated toilet seat or a handicapped toilet?: (Patient-Rptd) No  TIMED UP AND GO:  Was the test performed?  Yes  Length of time to ambulate 10 feet: 5 sec Gait steady and fast without use of assistive device  Cognitive Function: 6CIT completed        11/16/2023   10:36 AM 10/20/2022   11:48 AM 11/07/2021    9:51 AM  6CIT Screen  What Year? 0 points 0 points 0 points  What month? 0 points 0 points 0 points  What time? 0 points 0 points 0 points  Count back from 20 0 points 0 points 0 points  Months in reverse 0 points 0 points 0 points  Repeat phrase 2 points 2 points 4 points  Total Score 2 points 2 points 4 points    Immunizations Immunization History  Administered Date(s) Administered   Fluad Quad(high Dose 65+) 01/25/2019, 03/06/2020, 03/05/2021, 02/16/2022   Fluad Trivalent(High Dose 65+) 02/23/2023   Influenza Split 03/18/2001, 03/17/2002, 03/18/2005, 02/25/2007, 02/10/2010, 01/29/2011, 02/15/2012   Influenza, High Dose Seasonal PF 03/13/2013, 02/28/2014, 02/06/2015, 02/21/2016, 02/08/2017, 03/08/2018   PFIZER Comirnaty ETTERGray Top)Covid-19 Tri-Sucrose Vaccine 03/03/2022   PFIZER(Purple Top)SARS-COV-2 Vaccination 07/27/2019, 08/21/2019,  04/23/2020   Pfizer Covid-19 Vaccine Bivalent Booster 80yrs & up 03/20/2021   Pfizer(Comirnaty )Fall Seasonal Vaccine 12 years and older 02/23/2023   Pneumococcal Conjugate-13 11/11/2015   Pneumococcal Polysaccharide-23 03/26/2005, 03/24/2007   Td 06/28/1981, 03/26/2005   Tdap 11/12/2015, 05/01/2018   Zoster, Live 05/30/2010    Screening Tests Health Maintenance  Topic Date Due   Zoster Vaccines- Shingrix (1 of 2) 04[birth year]29/1965   Pneumococcal Vaccine: 50+ Years (4 of 4 - PCV20 or PCV21) 11/10/2020   COVID-19 Vaccine (7 - Pfizer risk 2024-25 season) 08/24/2023   INFLUENZA VACCINE  12/17/2023   Medicare Annual Wellness (AWV)   11/15/2024   DTaP/Tdap/Td (5 - Td or Tdap) 05/01/2028   Hepatitis C Screening  Completed   Hepatitis B Vaccines  Aged Out   HPV VACCINES  Aged Out   Meningococcal B Vaccine  Aged Out   Colonoscopy  Discontinued   Fecal DNA (Cologuard)  Discontinued    Health Maintenance  Health Maintenance Due  Topic Date Due   Zoster Vaccines- Shingrix (1 of 2) [birth year]29/1965   Pneumococcal Vaccine: 50+ Years (4 of 4 - PCV20 or PCV21) 11/10/2020   COVID-19 Vaccine (7 - Pfizer risk 2024-25 season) 08/24/2023   Health Maintenance Items Addressed: Declines shingles vaccine. Not due for pneumonia.  Additional Screening:  Vision Screening: Recommended annual ophthalmology exams for early detection of glaucoma and other disorders of the eye. Would you like a referral to an eye doctor? No    Dental Screening: Recommended annual dental exams for proper oral hygiene  Community Resource Referral / Chronic Care Management: CRR required this visit?  No   CCM required this visit?  No   Plan:    I have personally reviewed and noted the following in the patient's chart:   Medical and social history Use of alcohol, tobacco or illicit drugs  Current medications and supplements including opioid prescriptions. Patient is not currently taking opioid prescriptions. Functional ability and status Nutritional status Physical activity Advanced directives List of other physicians Hospitalizations, surgeries, and ER visits in previous 12 months Vitals Screenings to include cognitive, depression, and falls Referrals and appointments  In addition, I have reviewed and discussed with patient certain preventive protocols, quality metrics, and best practice recommendations. A written personalized care plan for preventive services as well as general preventive health recommendations were provided to patient.   Ardella FORBES Dawn, LPN   06/25/7972   After Visit Summary: (In Person-Declined) Patient declined AVS at  this time.  Notes: Nothing significant to report at this time.

## 2023-11-29 ENCOUNTER — Other Ambulatory Visit: Payer: Self-pay

## 2023-11-29 DIAGNOSIS — I6523 Occlusion and stenosis of bilateral carotid arteries: Secondary | ICD-10-CM

## 2023-12-07 ENCOUNTER — Ambulatory Visit (HOSPITAL_COMMUNITY)
Admission: RE | Admit: 2023-12-07 | Discharge: 2023-12-07 | Disposition: A | Discharge status: Home / Self Care | Source: Ambulatory Visit | Attending: Vascular Surgery | Admitting: Vascular Surgery

## 2023-12-07 ENCOUNTER — Ambulatory Visit: Attending: Vascular Surgery | Admitting: Physician Assistant

## 2023-12-07 VITALS — BP 187/77 | HR 63 | Temp 97.9°F | Ht 67.0 in | Wt 154.9 lb

## 2023-12-07 DIAGNOSIS — I6523 Occlusion and stenosis of bilateral carotid arteries: Secondary | ICD-10-CM | POA: Diagnosis not present

## 2023-12-07 NOTE — Progress Notes (Signed)
 POST OPERATIVE OFFICE NOTE    CC:  F/u for surgery  HPI:  This is a 78 y.o. male who is s/p Right TCAR on 10/20/23 by Dr. Magda. This was for asymptomatic high grade right ICA stenosis. He did very well post operatively and was discharged home POD#1. He has prior history of Left TCAR on 08/23/23.    Pt returns today for follow up with non invasive studies.  Pt states he has been feeling great. Denies any visual changes, slurred speech, trouble swallowing, unilateral upper or lower extremity weakness or numbness. He denies any pain in his legs on ambulation or rest. No tissue loss. He is medically managed on Aspirin  and Plavix . He does not tolerate po statins due to severe myalgias. He is on Zetia  and Pitavastatin .  Allergies  Allergen Reactions   Bystolic [Nebivolol Hcl]     Unknown reaction    Lipitor [Atorvastatin] Other (See Comments)    Muscle aches with statins   Lisinopril     Unknown reaction    Pravastatin     Muscle aches   Red Yeast Rice [Cholestin]     Muscle pain   Sulfonamide Derivatives Hives   Zocor [Simvastatin]     Muscle aches    Current Outpatient Medications  Medication Sig Dispense Refill   ALPRAZolam  (XANAX ) 0.5 MG tablet Take 1 tablet (0.5 mg total) by mouth at bedtime as needed. 30 tablet 1   amLODipine  (NORVASC ) 5 MG tablet Take 1 tablet (5 mg total) by mouth daily. 90 tablet 3   aspirin  81 MG tablet Take 81 mg by mouth daily.     clopidogrel  (PLAVIX ) 75 MG tablet Take 1 tablet (75 mg total) by mouth daily. 30 tablet 6   ezetimibe  (ZETIA ) 10 MG tablet Take 1 tablet (10 mg total) by mouth daily. 90 tablet 3   fenofibrate  160 MG tablet TAKE 1 TABLET(160 MG) BY MOUTH DAILY 90 tablet 3   fexofenadine (ALLEGRA) 180 MG tablet Take 180 mg by mouth daily as needed for allergies or rhinitis.     metoprolol  tartrate (LOPRESSOR ) 25 MG tablet 1/2 tablet twice a day 90 tablet 3   omeprazole  (PRILOSEC) 20 MG capsule Take 20 mg by mouth every 3 (three) days.      oxyCODONE -acetaminophen  (PERCOCET/ROXICET) 5-325 MG tablet Take 1 tablet by mouth every 6 (six) hours as needed for moderate pain (pain score 4-6). 10 tablet 0   Pitavastatin  Magnesium  (ZYPITAMAG ) 4 MG TABS TAKE 1 TABLET BY MOUTH EVERY DAY 90 tablet 1   VENTOLIN  HFA 108 (90 Base) MCG/ACT inhaler INHALE 2 PUFFS INTO THE LUNGS EVERY 6 HOURS AS NEEDED FOR WHEEZING OR SHORTNESS OF BREATH 18 g 0   No current facility-administered medications for this visit.   Facility-Administered Medications Ordered in Other Visits  Medication Dose Route Frequency Provider Last Rate Last Admin   influenza  inactive virus vaccine (FLUZONE/FLUARIX) injection 0.5 mL  0.5 mL Intramuscular Once Lalonde, John C, MD         ROS:  See HPI  Physical Exam:  Vitals:   12/07/23 0942 12/07/23 0944  BP: (!) 179/70 (!) 187/77  Pulse: 63   Temp: 97.9 F (36.6 C)   SpO2: 98%     General: well appearing, well nourished Cardiac: regular Lungs: non labored Incision:  Bilateral neck incisions are well healed Extremities:  moving all extremities without deficits. 2+ radial, 2+ DP pulses bilaterally Neuro: alert and oriented, speech coherent  Non invasive Vascular lab: VAS US   Carotid Duplex: Summary:  Right Carotid: Non-hemodynamically significant plaque <50% noted in the CCA. The ECA appears >50% stenosed. Widely patent distal CCA to distal ICA stent without evidence of stenosis.   Left Carotid: Hemodynamically significant plaque >50% visualized in the CCA. Widely patent distal CCA to distal ICA stent without  evidence of stenosis.   Vertebrals:  Right vertebral artery demonstrates antegrade flow. Atypical antegrade flow in the left vertebral artery.  Subclavians: Normal flow hemodynamics were seen in bilateral subclavian arteries.    Assessment/Plan:  This is a 78 y.o. male who is s/p: Right TCAR on 10/20/23 by Dr. Magda. This was for asymptomatic high grade right ICA stenosis. He did very well post operatively and  was discharged home POD#1. He has prior history of Left TCAR on 08/23/23.  He is without any neurological deficits. Incisions have healed well bilaterally.  - Duplex shows patent ICA's and stents bilaterally. Normal flow in the vertebral and subclavian arteries - continue Aspirin  and Plavix . On Zetia  and Pitavastatin  - F/u in 6 months with carotid duplex   Curry Damme, [facility] [phone]   Clinic MD:  Tomie Gaskins

## 2023-12-08 ENCOUNTER — Other Ambulatory Visit: Payer: Self-pay | Admitting: *Deleted

## 2023-12-08 DIAGNOSIS — I6523 Occlusion and stenosis of bilateral carotid arteries: Secondary | ICD-10-CM

## 2024-01-04 DIAGNOSIS — D692 Other nonthrombocytopenic purpura: Secondary | ICD-10-CM | POA: Diagnosis not present

## 2024-01-04 DIAGNOSIS — Z85828 Personal history of other malignant neoplasm of skin: Secondary | ICD-10-CM | POA: Diagnosis not present

## 2024-01-04 DIAGNOSIS — L821 Other seborrheic keratosis: Secondary | ICD-10-CM | POA: Diagnosis not present

## 2024-01-04 DIAGNOSIS — L570 Actinic keratosis: Secondary | ICD-10-CM | POA: Diagnosis not present

## 2024-01-04 DIAGNOSIS — L723 Sebaceous cyst: Secondary | ICD-10-CM | POA: Diagnosis not present

## 2024-01-06 ENCOUNTER — Ambulatory Visit: Payer: Medicare Other | Admitting: Family Medicine

## 2024-01-06 ENCOUNTER — Encounter: Payer: Self-pay | Admitting: Family Medicine

## 2024-01-06 VITALS — BP 134/70 | HR 72 | Ht 66.0 in | Wt 153.4 lb

## 2024-01-06 DIAGNOSIS — I10 Essential (primary) hypertension: Secondary | ICD-10-CM

## 2024-01-06 DIAGNOSIS — E781 Pure hyperglyceridemia: Secondary | ICD-10-CM

## 2024-01-06 DIAGNOSIS — C099 Malignant neoplasm of tonsil, unspecified: Secondary | ICD-10-CM

## 2024-01-06 DIAGNOSIS — M1990 Unspecified osteoarthritis, unspecified site: Secondary | ICD-10-CM | POA: Diagnosis not present

## 2024-01-06 DIAGNOSIS — N401 Enlarged prostate with lower urinary tract symptoms: Secondary | ICD-10-CM | POA: Diagnosis not present

## 2024-01-06 DIAGNOSIS — Z8249 Family history of ischemic heart disease and other diseases of the circulatory system: Secondary | ICD-10-CM

## 2024-01-06 DIAGNOSIS — I6529 Occlusion and stenosis of unspecified carotid artery: Secondary | ICD-10-CM

## 2024-01-06 DIAGNOSIS — Z23 Encounter for immunization: Secondary | ICD-10-CM

## 2024-01-06 DIAGNOSIS — L570 Actinic keratosis: Secondary | ICD-10-CM

## 2024-01-06 DIAGNOSIS — K219 Gastro-esophageal reflux disease without esophagitis: Secondary | ICD-10-CM

## 2024-01-06 DIAGNOSIS — I6523 Occlusion and stenosis of bilateral carotid arteries: Secondary | ICD-10-CM

## 2024-01-06 DIAGNOSIS — N1831 Chronic kidney disease, stage 3a: Secondary | ICD-10-CM

## 2024-01-06 DIAGNOSIS — Z0000 Encounter for general adult medical examination without abnormal findings: Secondary | ICD-10-CM | POA: Diagnosis not present

## 2024-01-06 DIAGNOSIS — Z9889 Other specified postprocedural states: Secondary | ICD-10-CM

## 2024-01-06 DIAGNOSIS — R7309 Other abnormal glucose: Secondary | ICD-10-CM | POA: Diagnosis not present

## 2024-01-06 DIAGNOSIS — G720 Drug-induced myopathy: Secondary | ICD-10-CM

## 2024-01-06 DIAGNOSIS — I2510 Atherosclerotic heart disease of native coronary artery without angina pectoris: Secondary | ICD-10-CM

## 2024-01-06 LAB — POCT GLYCOSYLATED HEMOGLOBIN (HGB A1C): Hemoglobin A1C: 5.6 % (ref 4.0–5.6)

## 2024-01-06 MED ORDER — METOPROLOL TARTRATE 25 MG PO TABS: ORAL_TABLET | ORAL | 3 refills | Status: AC

## 2024-01-06 MED ORDER — AMLODIPINE BESYLATE 5 MG PO TABS: 5.0000 mg | ORAL_TABLET | Freq: Every day | ORAL | 3 refills | Status: DC

## 2024-01-06 MED ORDER — FENOFIBRATE 160 MG PO TABS: ORAL_TABLET | ORAL | 3 refills | Status: AC

## 2024-01-06 MED ORDER — EZETIMIBE 10 MG PO TABS: 10.0000 mg | ORAL_TABLET | Freq: Every day | ORAL | 3 refills | Status: DC

## 2024-01-06 MED ORDER — ZYPITAMAG 4 MG PO TABS
1.0000 | ORAL_TABLET | Freq: Every day | ORAL | 1 refills | Status: DC
Start: 2024-01-06 — End: 2024-02-14

## 2024-01-06 NOTE — Progress Notes (Signed)
 Complete physical exam  Patient: Matthew Moreno   DOB: 09-10-1945   78 y.o. Male  MRN: [id number]  Subjective:    Chief Complaint  Patient presents with   Medicare Wellness    Fasting-    Matthew Moreno is a 78 y.o. male who presents today for a complete physical exam.   History of Present Illness   He reports consuming a general diet. Home exercise routine includes walking 30 minutes a day hrs per day. He generally feels well. He reports sleeping fairly well. Discussed the use of AI scribe software for clinical note transcription with the patient, who gave verbal consent to proceed.  He has a history of bilateral carotid endarterectomy and is on Plavix  and aspirin  for blood thinning. He denies taking any other blood thinners. He follows up with cardiology and has been recommended for stents. He is on metoprolol  and amlodipine .  He takes Allegra as needed for allergies and is on a cholesterol medication, fenofibrate , and Zetia . He takes Prilosec twice a week for reflux with no issues reported.  He has a history of anxiety and occasionally uses Xanax , noting he has about eight to ten pills left. He experiences episodes of sudden anxiety, such as when watching a movie, and has difficulty staying in enclosed spaces for long periods.  He had a past episode of shingles, which was treated effectively with medication. He declines the shingles vaccine, citing his previous experience with the condition.  No current issues with breathing, and he only uses albuterol  during the winter for chest congestion.  He reports occasional stiffness related to arthritis.  No issues with urination or prostate-related symptoms.  He has a history of tonsil cancer and notes occasional swallowing difficulties, which he manages by being cautious with his diet.      Most recent fall risk assessment:    01/06/2024    8:57 AM  Fall Risk   Falls in the past year? 0  Number falls in past yr: 0  Injury  with Fall? 0  Risk for fall due to : No Fall Risks  Follow up Falls evaluation completed     Most recent depression screenings:    01/06/2024    8:57 AM 11/16/2023   10:35 AM  PHQ 2/9 Scores  PHQ - 2 Score 0 0  PHQ- 9 Score  0    Ophtho 3 years since had eye appt since cataract surgery Derm. [facility] appt 01-05-24 Dentist Appt 01/25/24 [facility] [place] [facility] [place] [facility]     Immunization History  Administered Date(s) Administered   Fluad Quad(high Dose 65+) 01/25/2019, 03/06/2020, 03/05/2021, 02/16/2022   Fluad Trivalent(High Dose 65+) 02/23/2023   Influenza Split 03/18/2001, 03/17/2002, 03/18/2005, 02/25/2007, 02/10/2010, 01/29/2011, 02/15/2012   Influenza, High Dose Seasonal PF 03/13/2013, 02/28/2014, 02/06/2015, 02/21/2016, 02/08/2017, 03/08/2018   PFIZER Comirnaty [place]jos Top)Covid-19 Tri-Sucrose Vaccine 03/03/2022   PFIZER(Purple Top)SARS-COV-2 Vaccination 07/27/2019, 08/21/2019, 04/23/2020   PNEUMOCOCCAL CONJUGATE-20 01/06/2024   Pfizer Covid-19 Vaccine Bivalent Booster 21yrs & up 03/20/2021   Pfizer(Comirnaty )Fall Seasonal Vaccine 12 years and older 02/23/2023   Pneumococcal Conjugate-13 11/11/2015   Pneumococcal Polysaccharide-23 03/26/2005, 03/24/2007   Td 06/28/1981, 03/26/2005   Tdap 11/12/2015, 05/01/2018   Zoster, Live 05/30/2010    Health Maintenance  Topic Date Due   Zoster Vaccines- Shingrix (1 of 2) 04/29/1965   COVID-19 Vaccine (7 - Pfizer risk 2024-25 season) 01/22/2024 (Originally 08/24/2023)   INFLUENZA VACCINE  08/15/2024 (Originally 12/17/2023)  Medicare Annual Wellness (AWV)  11/15/2024   DTaP/Tdap/Td (5 - Td or Tdap) 05/01/2028   Pneumococcal Vaccine: 50+ Years  Completed   Hepatitis C Screening  Completed   HPV VACCINES  Aged Out   Meningococcal B Vaccine  Aged Out   Colonoscopy  Discontinued   Fecal DNA (Cologuard)  Discontinued    Patient Care Team: Joyce Norleen BROCKS, MD as PCP -  General (Family Medicine) Izell Domino, MD as Attending Physician (Radiation Oncology)   Outpatient Medications Prior to Visit  Medication Sig   ALPRAZolam  (XANAX ) 0.5 MG tablet Take 1 tablet (0.5 mg total) by mouth at bedtime as needed.   aspirin  81 MG tablet Take 81 mg by mouth daily.   clopidogrel  (PLAVIX ) 75 MG tablet Take 1 tablet (75 mg total) by mouth daily.   fexofenadine (ALLEGRA) 180 MG tablet Take 180 mg by mouth daily as needed for allergies or rhinitis.   omeprazole  (PRILOSEC) 20 MG capsule Take 20 mg by mouth every 3 (three) days.   VENTOLIN  HFA 108 (90 Base) MCG/ACT inhaler INHALE 2 PUFFS INTO THE LUNGS EVERY 6 HOURS AS NEEDED FOR WHEEZING OR SHORTNESS OF BREATH   [DISCONTINUED] amLODipine  (NORVASC ) 5 MG tablet Take 1 tablet (5 mg total) by mouth daily.   [DISCONTINUED] ezetimibe  (ZETIA ) 10 MG tablet Take 1 tablet (10 mg total) by mouth daily.   [DISCONTINUED] fenofibrate  160 MG tablet TAKE 1 TABLET(160 MG) BY MOUTH DAILY   [DISCONTINUED] metoprolol  tartrate (LOPRESSOR ) 25 MG tablet 1/2 tablet twice a day   [DISCONTINUED] Pitavastatin  Magnesium  (ZYPITAMAG ) 4 MG TABS TAKE 1 TABLET BY MOUTH EVERY DAY   [DISCONTINUED] oxyCODONE -acetaminophen  (PERCOCET/ROXICET) 5-325 MG tablet Take 1 tablet by mouth every 6 (six) hours as needed for moderate pain (pain score 4-6).   Facility-Administered Medications Prior to Visit  Medication Dose Route Frequency Provider   influenza  inactive virus vaccine (FLUZONE/FLUARIX) injection 0.5 mL  0.5 mL Intramuscular Once Mieke Brinley C, MD    Review of Systems  All other systems reviewed and are negative.   Family and social history as well as health maintenance and immunizations was reviewed.     Objective:       Physical Exam   Alert and in no distress. Tympanic membranes and canals are normal. Pharyngeal area is normal. Neck is supple without adenopathy or thyromegaly; induration is noted on the left neck and the area consistent with  his tongue cancer surgery. Cardiac exam shows a regular sinus rhythm without murmurs or gallops, loud S2 is noted. Lungs are clear to auscultation. Hemoglobin A1c is 5.6     Assessment & Plan:   Assessment and Plan    Adult Wellness Visit Routine annual wellness visit with medication and health maintenance review. - Administer pneumonia vaccine today. - Recommend flu, COVID, and RSV vaccines in October. - Recommend shingles vaccine, but he declined. - Review and update health maintenance records. Routine general medical examination at a health care facility  Arthritis  Actinic keratosis  Benign prostatic hyperplasia with incomplete bladder emptying  Coronary artery disease involving native coronary artery of native heart without angina pectoris - Plan: ezetimibe  (ZETIA ) 10 MG tablet, fenofibrate  160 MG tablet, metoprolol  tartrate (LOPRESSOR ) 25 MG tablet  Essential hypertension - Plan: amLODipine  (NORVASC ) 5 MG tablet, metoprolol  tartrate (LOPRESSOR ) 25 MG tablet  Gastroesophageal reflux disease without esophagitis  S/P carotid endarterectomy  Stage 3a chronic kidney disease (HCC)  Tonsil cancer (HCC)  Statin myopathy  Elevated glucose - Plan: POCT glycosylated hemoglobin (  Hb A1C)  Need for vaccination against Streptococcus pneumoniae - Plan: Pneumococcal conjugate vaccine 20-valent (Prevnar 20)  Hypertriglyceridemia - Plan: ezetimibe  (ZETIA ) 10 MG tablet, fenofibrate  160 MG tablet, Pitavastatin  Magnesium  (ZYPITAMAG ) 4 MG TABS  Family history of heart disease - Plan: Pitavastatin  Magnesium  (ZYPITAMAG ) 4 MG TABS  Discussed health benefits of physical activity, and encouraged him to engage in regular exercise appropriate for his age and condition.  Return in about 1 year (around 01/05/2025).      Norleen Jobs, MD

## 2024-01-25 DIAGNOSIS — K080 Exfoliation of teeth due to systemic causes: Secondary | ICD-10-CM | POA: Diagnosis not present

## 2024-02-02 ENCOUNTER — Other Ambulatory Visit: Payer: Self-pay | Admitting: Family Medicine

## 2024-02-02 DIAGNOSIS — F419 Anxiety disorder, unspecified: Secondary | ICD-10-CM

## 2024-02-02 NOTE — Telephone Encounter (Signed)
 Last apt 01/06/24

## 2024-02-14 ENCOUNTER — Other Ambulatory Visit: Payer: Self-pay | Admitting: Family Medicine

## 2024-02-14 DIAGNOSIS — E781 Pure hyperglyceridemia: Secondary | ICD-10-CM

## 2024-02-14 DIAGNOSIS — Z8249 Family history of ischemic heart disease and other diseases of the circulatory system: Secondary | ICD-10-CM

## 2024-02-15 ENCOUNTER — Other Ambulatory Visit: Payer: Self-pay | Admitting: Family Medicine

## 2024-02-15 DIAGNOSIS — I2510 Atherosclerotic heart disease of native coronary artery without angina pectoris: Secondary | ICD-10-CM

## 2024-02-15 DIAGNOSIS — E781 Pure hyperglyceridemia: Secondary | ICD-10-CM

## 2024-02-29 ENCOUNTER — Ambulatory Visit (INDEPENDENT_AMBULATORY_CARE_PROVIDER_SITE_OTHER)

## 2024-02-29 DIAGNOSIS — Z23 Encounter for immunization: Secondary | ICD-10-CM | POA: Diagnosis not present

## 2024-03-06 ENCOUNTER — Other Ambulatory Visit: Payer: Self-pay | Admitting: Family Medicine

## 2024-03-06 DIAGNOSIS — I10 Essential (primary) hypertension: Secondary | ICD-10-CM

## 2024-06-06 ENCOUNTER — Ambulatory Visit

## 2024-06-06 ENCOUNTER — Ambulatory Visit (HOSPITAL_COMMUNITY)

## 2024-08-01 ENCOUNTER — Ambulatory Visit

## 2024-08-01 ENCOUNTER — Ambulatory Visit (HOSPITAL_COMMUNITY)

## 2024-08-07 ENCOUNTER — Ambulatory Visit: Admitting: Internal Medicine

## 2024-11-28 ENCOUNTER — Ambulatory Visit: Payer: Self-pay

## 2025-01-09 ENCOUNTER — Ambulatory Visit: Payer: Self-pay | Admitting: Family Medicine
